# Patient Record
Sex: Female | Born: 1935 | Race: White | Hispanic: No | Smoking: Former smoker
Health system: Southern US, Community
[De-identification: ages and names within clinical notes are randomized; demographics above are authoritative.]

## PROBLEM LIST (undated history)

## (undated) DIAGNOSIS — C55 Malignant neoplasm of uterus, part unspecified: Secondary | ICD-10-CM

## (undated) DIAGNOSIS — I519 Heart disease, unspecified: Secondary | ICD-10-CM

## (undated) DIAGNOSIS — R740 Nonspecific elevation of levels of transaminase and lactic acid dehydrogenase [LDH]: Secondary | ICD-10-CM

## (undated) DIAGNOSIS — N211 Calculus in urethra: Secondary | ICD-10-CM

## (undated) DIAGNOSIS — R7303 Prediabetes: Secondary | ICD-10-CM

## (undated) DIAGNOSIS — I10 Essential (primary) hypertension: Secondary | ICD-10-CM

## (undated) DIAGNOSIS — N312 Flaccid neuropathic bladder, not elsewhere classified: Secondary | ICD-10-CM

## (undated) DIAGNOSIS — M1990 Unspecified osteoarthritis, unspecified site: Secondary | ICD-10-CM

## (undated) DIAGNOSIS — K219 Gastro-esophageal reflux disease without esophagitis: Secondary | ICD-10-CM

## (undated) HISTORY — DX: Heart disease, unspecified: I51.9

## (undated) HISTORY — DX: Flaccid neuropathic bladder, not elsewhere classified: N31.2

## (undated) HISTORY — DX: Prediabetes: R73.03

## (undated) HISTORY — DX: Calculus in urethra: N21.1

## (undated) HISTORY — PX: TOTAL KNEE ARTHROPLASTY: SHX125

## (undated) HISTORY — PX: APPENDECTOMY: SHX54

## (undated) HISTORY — DX: Essential (primary) hypertension: I10

## (undated) HISTORY — DX: Unspecified osteoarthritis, unspecified site: M19.90

## (undated) HISTORY — DX: Gastro-esophageal reflux disease without esophagitis: K21.9

## (undated) HISTORY — DX: Nonspecific elevation of levels of transaminase and lactic acid dehydrogenase (ldh): R74.0

## (undated) HISTORY — DX: Malignant neoplasm of uterus, part unspecified: C55

---

## 1988-05-22 HISTORY — PX: TOTAL ABDOMINAL HYSTERECTOMY W/ BILATERAL SALPINGOOPHORECTOMY: SHX83

## 1989-05-22 HISTORY — PX: OTHER SURGICAL HISTORY: SHX169

## 1994-05-22 DIAGNOSIS — R7401 Elevation of levels of liver transaminase levels: Secondary | ICD-10-CM

## 1994-05-22 HISTORY — DX: Elevation of levels of liver transaminase levels: R74.01

## 1996-05-22 HISTORY — PX: COLONOSCOPY W/ POLYPECTOMY: SHX1380

## 1998-01-27 ENCOUNTER — Other Ambulatory Visit: Admission: RE | Admit: 1998-01-27 | Discharge: 1998-01-27 | Payer: Self-pay | Admitting: Obstetrics and Gynecology

## 1999-03-23 ENCOUNTER — Other Ambulatory Visit: Admission: RE | Admit: 1999-03-23 | Discharge: 1999-03-23 | Payer: Self-pay | Admitting: Obstetrics and Gynecology

## 2000-04-24 ENCOUNTER — Encounter: Admission: RE | Admit: 2000-04-24 | Discharge: 2000-04-24 | Payer: Self-pay | Admitting: Internal Medicine

## 2000-04-24 ENCOUNTER — Encounter: Payer: Self-pay | Admitting: Internal Medicine

## 2000-05-08 ENCOUNTER — Other Ambulatory Visit: Admission: RE | Admit: 2000-05-08 | Discharge: 2000-05-08 | Payer: Self-pay | Admitting: Obstetrics and Gynecology

## 2000-11-26 ENCOUNTER — Inpatient Hospital Stay (HOSPITAL_COMMUNITY): Admission: RE | Admit: 2000-11-26 | Discharge: 2000-11-28 | Payer: Self-pay | Admitting: Orthopaedic Surgery

## 2000-11-28 ENCOUNTER — Inpatient Hospital Stay (HOSPITAL_COMMUNITY)
Admission: RE | Admit: 2000-11-28 | Discharge: 2000-12-06 | Payer: Self-pay | Admitting: Physical Medicine & Rehabilitation

## 2001-06-26 ENCOUNTER — Encounter: Payer: Self-pay | Admitting: Obstetrics and Gynecology

## 2001-06-26 ENCOUNTER — Encounter: Admission: RE | Admit: 2001-06-26 | Discharge: 2001-06-26 | Payer: Self-pay | Admitting: Obstetrics and Gynecology

## 2002-07-24 ENCOUNTER — Encounter: Payer: Self-pay | Admitting: Internal Medicine

## 2002-07-24 ENCOUNTER — Encounter: Admission: RE | Admit: 2002-07-24 | Discharge: 2002-07-24 | Payer: Self-pay | Admitting: Internal Medicine

## 2003-07-30 ENCOUNTER — Encounter: Admission: RE | Admit: 2003-07-30 | Discharge: 2003-07-30 | Payer: Self-pay | Admitting: Internal Medicine

## 2004-05-22 HISTORY — PX: ESOPHAGEAL DILATION: SHX303

## 2004-06-16 ENCOUNTER — Other Ambulatory Visit: Admission: RE | Admit: 2004-06-16 | Discharge: 2004-06-16 | Payer: Self-pay | Admitting: Obstetrics and Gynecology

## 2004-08-01 ENCOUNTER — Ambulatory Visit: Payer: Self-pay | Admitting: Gastroenterology

## 2004-08-11 ENCOUNTER — Encounter: Admission: RE | Admit: 2004-08-11 | Discharge: 2004-08-11 | Payer: Self-pay | Admitting: Internal Medicine

## 2004-08-17 ENCOUNTER — Ambulatory Visit: Payer: Self-pay | Admitting: Gastroenterology

## 2004-08-18 ENCOUNTER — Ambulatory Visit: Payer: Self-pay | Admitting: Internal Medicine

## 2004-11-16 ENCOUNTER — Ambulatory Visit: Payer: Self-pay | Admitting: Internal Medicine

## 2004-12-22 LAB — HM COLONOSCOPY

## 2004-12-27 ENCOUNTER — Ambulatory Visit: Payer: Self-pay | Admitting: Gastroenterology

## 2005-01-18 ENCOUNTER — Ambulatory Visit: Payer: Self-pay | Admitting: Gastroenterology

## 2005-08-17 ENCOUNTER — Other Ambulatory Visit: Admission: RE | Admit: 2005-08-17 | Discharge: 2005-08-17 | Payer: Self-pay | Admitting: Obstetrics and Gynecology

## 2005-08-23 ENCOUNTER — Encounter: Admission: RE | Admit: 2005-08-23 | Discharge: 2005-08-23 | Payer: Self-pay | Admitting: Internal Medicine

## 2005-09-26 ENCOUNTER — Ambulatory Visit: Payer: Self-pay | Admitting: Internal Medicine

## 2006-10-04 ENCOUNTER — Encounter: Payer: Self-pay | Admitting: Internal Medicine

## 2006-10-04 ENCOUNTER — Ambulatory Visit: Payer: Self-pay | Admitting: Internal Medicine

## 2006-10-04 LAB — CONVERTED CEMR LAB
AST: 26 units/L (ref 0–37)
BUN: 27 mg/dL — ABNORMAL HIGH (ref 6–23)
Basophils Absolute: 0.1 10*3/uL (ref 0.0–0.1)
Cholesterol: 199 mg/dL (ref 0–200)
Creatinine, Ser: 1.3 mg/dL — ABNORMAL HIGH (ref 0.4–1.2)
Eosinophils Absolute: 0.2 10*3/uL (ref 0.0–0.6)
Eosinophils Relative: 1.9 % (ref 0.0–5.0)
HCT: 44.9 % (ref 36.0–46.0)
Hemoglobin: 15.1 g/dL — ABNORMAL HIGH (ref 12.0–15.0)
Hgb A1c MFr Bld: 5.8 % (ref 4.6–6.0)
MCHC: 33.5 g/dL (ref 30.0–36.0)
MCV: 90.4 fL (ref 78.0–100.0)
Microalb Creat Ratio: 11.7 mg/g (ref 0.0–30.0)
Monocytes Absolute: 1 10*3/uL — ABNORMAL HIGH (ref 0.2–0.7)
Neutrophils Relative %: 60.5 % (ref 43.0–77.0)
RBC: 4.97 M/uL (ref 3.87–5.11)
WBC: 10.3 10*3/uL (ref 4.5–10.5)

## 2006-10-16 ENCOUNTER — Ambulatory Visit: Payer: Self-pay | Admitting: Internal Medicine

## 2006-10-22 ENCOUNTER — Ambulatory Visit: Payer: Self-pay | Admitting: Gastroenterology

## 2007-03-27 ENCOUNTER — Telehealth (INDEPENDENT_AMBULATORY_CARE_PROVIDER_SITE_OTHER): Payer: Self-pay | Admitting: *Deleted

## 2007-05-23 HISTORY — PX: POSTERIOR LAMINECTOMY / DECOMPRESSION LUMBAR SPINE: SUR740

## 2007-06-19 ENCOUNTER — Telehealth (INDEPENDENT_AMBULATORY_CARE_PROVIDER_SITE_OTHER): Payer: Self-pay | Admitting: *Deleted

## 2007-06-27 ENCOUNTER — Telehealth (INDEPENDENT_AMBULATORY_CARE_PROVIDER_SITE_OTHER): Payer: Self-pay | Admitting: *Deleted

## 2007-07-31 ENCOUNTER — Encounter: Payer: Self-pay | Admitting: Internal Medicine

## 2007-08-02 ENCOUNTER — Telehealth (INDEPENDENT_AMBULATORY_CARE_PROVIDER_SITE_OTHER): Payer: Self-pay | Admitting: *Deleted

## 2007-08-09 ENCOUNTER — Telehealth (INDEPENDENT_AMBULATORY_CARE_PROVIDER_SITE_OTHER): Payer: Self-pay | Admitting: *Deleted

## 2007-09-10 ENCOUNTER — Telehealth (INDEPENDENT_AMBULATORY_CARE_PROVIDER_SITE_OTHER): Payer: Self-pay | Admitting: *Deleted

## 2007-11-01 DIAGNOSIS — C55 Malignant neoplasm of uterus, part unspecified: Secondary | ICD-10-CM

## 2007-12-30 ENCOUNTER — Ambulatory Visit: Payer: Self-pay | Admitting: Internal Medicine

## 2007-12-30 ENCOUNTER — Telehealth: Payer: Self-pay | Admitting: Internal Medicine

## 2007-12-30 DIAGNOSIS — R0609 Other forms of dyspnea: Secondary | ICD-10-CM

## 2007-12-30 DIAGNOSIS — R0989 Other specified symptoms and signs involving the circulatory and respiratory systems: Secondary | ICD-10-CM | POA: Insufficient documentation

## 2007-12-30 DIAGNOSIS — I10 Essential (primary) hypertension: Secondary | ICD-10-CM

## 2007-12-30 HISTORY — DX: Essential (primary) hypertension: I10

## 2008-01-01 ENCOUNTER — Encounter (INDEPENDENT_AMBULATORY_CARE_PROVIDER_SITE_OTHER): Payer: Self-pay | Admitting: *Deleted

## 2008-01-03 ENCOUNTER — Encounter: Payer: Self-pay | Admitting: Internal Medicine

## 2008-01-05 LAB — CONVERTED CEMR LAB
Albumin: 3.6 g/dL (ref 3.5–5.2)
Alkaline Phosphatase: 82 units/L (ref 39–117)
BUN: 31 mg/dL — ABNORMAL HIGH (ref 6–23)
Basophils Relative: 0.9 % (ref 0.0–3.0)
Calcium: 9.4 mg/dL (ref 8.4–10.5)
Creatinine, Ser: 1.3 mg/dL — ABNORMAL HIGH (ref 0.4–1.2)
Eosinophils Absolute: 0.2 10*3/uL (ref 0.0–0.7)
Eosinophils Relative: 1.7 % (ref 0.0–5.0)
GFR calc Af Amer: 51 mL/min
GFR calc non Af Amer: 42 mL/min
Glucose, Bld: 108 mg/dL — ABNORMAL HIGH (ref 70–99)
HCT: 42.8 % (ref 36.0–46.0)
Hemoglobin: 14.6 g/dL (ref 12.0–15.0)
MCV: 92.5 fL (ref 78.0–100.0)
Monocytes Absolute: 0.9 10*3/uL (ref 0.1–1.0)
Monocytes Relative: 7.6 % (ref 3.0–12.0)
Neutro Abs: 7.8 10*3/uL — ABNORMAL HIGH (ref 1.4–7.7)
Platelets: 288 10*3/uL (ref 150–400)
Total CHOL/HDL Ratio: 3.7
Total Protein: 6.8 g/dL (ref 6.0–8.3)
Triglycerides: 78 mg/dL (ref 0–149)
WBC: 11.4 10*3/uL — ABNORMAL HIGH (ref 4.5–10.5)

## 2008-01-06 ENCOUNTER — Encounter (INDEPENDENT_AMBULATORY_CARE_PROVIDER_SITE_OTHER): Payer: Self-pay | Admitting: *Deleted

## 2008-01-14 ENCOUNTER — Inpatient Hospital Stay (HOSPITAL_COMMUNITY): Admission: RE | Admit: 2008-01-14 | Discharge: 2008-01-18 | Payer: Self-pay | Admitting: Orthopaedic Surgery

## 2008-01-23 ENCOUNTER — Encounter: Payer: Self-pay | Admitting: Internal Medicine

## 2008-02-07 ENCOUNTER — Encounter: Payer: Self-pay | Admitting: Internal Medicine

## 2008-03-11 ENCOUNTER — Encounter: Payer: Self-pay | Admitting: Internal Medicine

## 2008-03-31 ENCOUNTER — Encounter: Payer: Self-pay | Admitting: Internal Medicine

## 2008-06-10 ENCOUNTER — Encounter: Payer: Self-pay | Admitting: Internal Medicine

## 2008-07-24 ENCOUNTER — Encounter: Payer: Self-pay | Admitting: Internal Medicine

## 2008-09-29 ENCOUNTER — Encounter: Payer: Self-pay | Admitting: Internal Medicine

## 2009-01-01 ENCOUNTER — Ambulatory Visit: Payer: Self-pay | Admitting: Internal Medicine

## 2009-01-01 DIAGNOSIS — R609 Edema, unspecified: Secondary | ICD-10-CM

## 2009-01-01 DIAGNOSIS — N312 Flaccid neuropathic bladder, not elsewhere classified: Secondary | ICD-10-CM

## 2009-01-02 LAB — CONVERTED CEMR LAB
ALT: 22 units/L (ref 0–35)
BUN: 19 mg/dL (ref 6–23)
Chloride: 102 meq/L (ref 96–112)
Glucose, Bld: 101 mg/dL — ABNORMAL HIGH (ref 70–99)
Potassium: 3.8 meq/L (ref 3.5–5.3)

## 2009-01-05 ENCOUNTER — Encounter (INDEPENDENT_AMBULATORY_CARE_PROVIDER_SITE_OTHER): Payer: Self-pay | Admitting: *Deleted

## 2009-01-07 ENCOUNTER — Ambulatory Visit: Payer: Self-pay

## 2009-01-07 ENCOUNTER — Encounter: Payer: Self-pay | Admitting: Internal Medicine

## 2009-01-12 ENCOUNTER — Encounter (INDEPENDENT_AMBULATORY_CARE_PROVIDER_SITE_OTHER): Payer: Self-pay | Admitting: *Deleted

## 2009-01-20 ENCOUNTER — Encounter: Payer: Self-pay | Admitting: Internal Medicine

## 2009-02-03 ENCOUNTER — Ambulatory Visit (HOSPITAL_COMMUNITY): Admission: RE | Admit: 2009-02-03 | Discharge: 2009-02-03 | Payer: Self-pay | Admitting: Urology

## 2009-05-03 ENCOUNTER — Encounter: Payer: Self-pay | Admitting: Internal Medicine

## 2009-06-25 ENCOUNTER — Encounter (INDEPENDENT_AMBULATORY_CARE_PROVIDER_SITE_OTHER): Payer: Self-pay | Admitting: *Deleted

## 2009-09-06 ENCOUNTER — Encounter: Payer: Self-pay | Admitting: Internal Medicine

## 2010-01-28 ENCOUNTER — Ambulatory Visit: Payer: Self-pay | Admitting: Internal Medicine

## 2010-02-21 ENCOUNTER — Encounter: Payer: Self-pay | Admitting: Internal Medicine

## 2010-02-24 ENCOUNTER — Telehealth: Payer: Self-pay | Admitting: Gastroenterology

## 2010-03-18 ENCOUNTER — Ambulatory Visit: Payer: Self-pay | Admitting: Gastroenterology

## 2010-03-24 ENCOUNTER — Encounter: Payer: Self-pay | Admitting: Gastroenterology

## 2010-04-01 ENCOUNTER — Encounter: Payer: Self-pay | Admitting: Internal Medicine

## 2010-06-19 LAB — CONVERTED CEMR LAB
Albumin: 3.5 g/dL (ref 3.5–5.2)
Alkaline Phosphatase: 86 units/L (ref 39–117)
Basophils Relative: 0.6 % (ref 0.0–3.0)
Calcium: 9.8 mg/dL (ref 8.4–10.5)
Creatinine, Ser: 1.2 mg/dL (ref 0.4–1.2)
Eosinophils Relative: 2.5 % (ref 0.0–5.0)
GFR calc non Af Amer: 45.55 mL/min (ref >60)
Glucose, Bld: 106 mg/dL — ABNORMAL HIGH (ref 70–99)
HDL: 56.5 mg/dL (ref >39.00)
Hemoglobin: 14.3 g/dL (ref 12.0–15.0)
Lymphocytes Relative: 29.9 % (ref 12.0–46.0)
MCHC: 33.2 g/dL (ref 30.0–36.0)
Monocytes Relative: 9.2 % (ref 3.0–12.0)
Neutro Abs: 5.7 10*3/uL (ref 1.4–7.7)
Neutrophils Relative %: 57.8 % (ref 43.0–77.0)
RBC: 4.66 M/uL (ref 3.87–5.11)
Sodium: 141 meq/L (ref 135–145)
Total CHOL/HDL Ratio: 3
Triglycerides: 79 mg/dL (ref 0.0–149.0)
VLDL: 15.8 mg/dL (ref 0.0–40.0)
WBC: 9.9 10*3/uL (ref 4.5–10.5)

## 2010-06-23 NOTE — Letter (Signed)
Summary: [facility]  [facility]   Imported By: Lanelle Bal 09/13/2009 13:09:12  _____________________________________________________________________  External Attachment:    Type:   Image     Comment:   External Document

## 2010-06-23 NOTE — Assessment & Plan Note (Signed)
Summary: discuss change in bowels...as.    History of Present Illness Visit Type: new patient  Primary GI MD: Sheryn Bison MD FACP FAGA Primary Provider: Marga Melnick, MD  Requesting Provider: na Chief Complaint: constipation, and rectal pain and pressure History of Present Illness:   77 year old Caucasian female who has chronic tenesmus and constipation related to spinal surgery with sacral nerve damage 2 years ago. She is on laxatives, fiber supplements, and p.r.n. MiraLax and continue to complain of spasm and a full sensation in her rectum. She at times does have to use enemas. Her problems are probably exacerbated by her use of tramadol for back pain. She also take daily omeprazole for acid reflux. Colonoscopy was unremarkable several years ago, and she denies upper GI or hepatobiliary complaints.  She has an atonic bladder and has a chronic suprapubic bladder catheter in place for the last several years for her neurogenic bladder. She also is status post hysterectomy and removal of her ovaries for uterine cancer.   GI Review of Systems      Denies abdominal pain, acid reflux, belching, bloating, chest pain, dysphagia with liquids, dysphagia with solids, heartburn, loss of appetite, nausea, vomiting, vomiting blood, weight loss, and  weight gain.      Reports change in bowel habits, constipation, and  rectal pain.     Denies anal fissure, black tarry stools, diarrhea, diverticulosis, fecal incontinence, heme positive stool, hemorrhoids, irritable bowel syndrome, jaundice, light color stool, liver problems, and  rectal bleeding.    Current Medications (verified): 1)  Omeprazole 20 Mg  Tbec (Omeprazole) .... Take One Tablet Daily 2)  Benazepril Hcl 40 Mg  Tabs (Benazepril Hcl) .... Take One Tablet Daily 3)  Multivitamins   Tabs (Multiple Vitamin) .Marland Kitchen.. 1 By Mouth Once Daily 4)  Vitamin E 400 Unit  Caps (Vitamin E) .Marland Kitchen.. 1 By Mouth Once Daily 5)  Caltrate 600+d Plus 600-400 Mg-Unit   Tabs (Calcium Carbonate-Vit D-Min) .Marland Kitchen.. 1 By Mouth Two Times A Day 6)  Tramadol Hcl 50 Mg Tabs (Tramadol Hcl) .Marland Kitchen.. 1-2 By Mouth As Needed 7)  Fish Oil 1200 Mg Caps (Omega-3 Fatty Acids) .Marland Kitchen.. 1 By Mouth Once Daily 8)  Tylenol 325 Mg Tabs (Acetaminophen) .... As Needed 9)  Docusate Sodium 100 Mg Caps (Docusate Sodium) .Marland Kitchen.. 1 By Mouth -Am, 1 By Mouth At Bedtime As Needed 10)  Citrucel  Powd (Methylcellulose (Laxative)) .... Every Morning, More If Needed 11)  Miralax  Powd (Polyethylene Glycol 3350) .... As Needed 12)  Furosemide 40 Mg Tabs (Furosemide) .Marland Kitchen.. 1 Once Daily  Allergies (verified): No Known Drug Allergies  Past History:  Past medical, surgical, family and social histories (including risk factors) reviewed for relevance to current acute and chronic problems.  Past Medical History: SBO post TAH/BSO  for uterine cancer 1991;positive hepatitis serology ( + anti  B core antibody) 1996 ; R carotid bruit 2003, normal Doppler Neurogenic Bladder (596.4), Dr  Marcine Matar PREVENTIVE HEALTH CARE (ICD-V70.0) ATONIC BLADDER (ICD-596.4) EDEMA (ICD-782.3) DYSPNEA/SHORTNESS OF BREATH (ICD-786.09) LUMBAR RADICULOPATHY, LEFT (ICD-724.4) METABOLIC SYNDROME X (ICD-277.7) HYPERTENSION (ICD-401.9) GERD (ICD-530.81) COLONIC POLYPS, HX OF (ICD-V12.72) DIVERTICULOSIS, COLON (ICD-562.10) HIATAL HERNIA (ICD-553.3) IRRITABLE BOWEL SYNDROME, HX OF (ICD-V12.79) UTERINE CANCER, HX OF (ICD-V10.42) DEGENERATIVE JOINT DISEASE (ICD-715.90) Family Hx of NEOPLASM, COLON, FAMILY HX (ICD-V16.0) FAMILY HISTORY DIABETES 1ST DEGREE RELATIVE (ICD-V18.0) CAROTID BRUIT, RIGHT (ICD-785.9)  Past Surgical History: colonoscopy revealed tics;endoscopy revealed occult stricture 2006 Appendectomy Bladder Repair, 1992 BSO with TAH, 1990 for uterine  cancer, Dr Gaye Alken  Knee Replacement, 1996(R) & 2002(L) Lysis of adhesions SBO 1991 Colon polypectomy X1 ,PMH of  Lumbar laminectomy/ decompression. L4-5,S-1  12/9007, Dr Annell Greening Suprapubic cath 2010, Dr Dahlstedt Shoulder Surgery   Family History: Reviewed history from 01/28/2010 and no changes required. Father: CVA Mother: colon cancer, DM Siblings: bro: DM,CAD,CE   Social History: Reviewed history from 12/30/2007 and no changes required. Retired Industrial/product designer  Patient is a former smoker.  Alcohol Use - no Illicit Drug Use - no Drug Use:  no  Review of Systems       The patient complains of back pain, shortness of breath, swelling of feet/legs, and urine leakage.  The patient denies allergy/sinus, anemia, anxiety-new, arthritis/joint pain, blood in urine, breast changes/lumps, change in vision, confusion, cough, coughing up blood, depression-new, fainting, fatigue, fever, headaches-new, hearing problems, heart murmur, heart rhythm changes, itching, menstrual pain, muscle pains/cramps, night sweats, nosebleeds, pregnancy symptoms, skin rash, sleeping problems, sore throat, swollen lymph glands, thirst - excessive , urination - excessive , urination changes/pain, vision changes, and voice change.    Vital Signs:  Patient profile:   77 year old female Height:      62 inches Weight:      235 pounds BMI:     43.14 BSA:     2.05 Pulse rate:   60 / minute Pulse rhythm:   regular BP sitting:   120 / 64  (left arm) Cuff size:   large  Vitals Entered By: Ok Anis CMA (March 18, 2010 9:57 AM)  Physical Exam  General:  Well developed, well nourished, no acute distress.healthy appearing.  healthy appearing.   Head:  Normocephalic and atraumatic. Eyes:  PERRLA, no icterus. Lungs:  Clear throughout to auscultation. Heart:  Regular rate and rhythm; no murmurs, rubs,  or bruits. Rectal:  inspection of the rectum is unremarkable as is rectal exam. There is firm hard stool in the rectal vault is guaiac negative. There is no evidence of impaction, masses or tenderness. Rectal squeeze pressure appears intact. Extremities:  No  clubbing, cyanosis, edema or deformities noted.trace pedal edema.  trace pedal edema.   Neurologic:  Alert and  oriented x4;  grossly normal neurologically. Psych:  Alert and cooperative. Normal mood and affect.anxious.  anxious.     Impression & Recommendations:  Problem # 1:  CONSTIPATION (ICD-564.00) Assessment Deteriorated continue fiber supplements, MiraLax, and trial of Amitiza 8 micrograms twice a day with meals. I also have prescribed local and RAM cream twice a day to her rectum to hopefully alleviate some of her rectal spasm symptomatology. We also have referred her to see Dr. Dorita Fray at the [facility] neurogenic GI clinic for anorectal manometry and perhaps biofeedback training. I do not think repeat colonoscopy is indicated at this time. Some of her problems may be related to use her tramadol.  Problem # 2:  ATONIC BLADDER (ICD-596.4) Assessment: Unchanged  Problem # 3:  LUMBAR RADICULOPATHY, LEFT (ICD-724.4) Assessment: Comment Only  Problem # 4:  GERD (ICD-530.81) Assessment: Improved continue reflex maneuvers and omeprazole 20 mg a day.  Other Orders: [facility])  Patient Instructions: 1)  Copy sent to : Marga Melnick, MD  2)  Your prescription(s) have been sent to you pharmacy.  3)  Please continue current medications.  4)  We are faxing all of your records to [facility] and will contact you about  an appt when they schedule you. 5)  The medication list was reviewed and reconciled.  All  changed / newly prescribed medications were explained.  A complete medication list was provided to the patient / caregiver. Prescriptions: ANALPRAM-HC 1-2.5 % CREA (HYDROCORTISONE ACE-PRAMOXINE) use per rectum two times a day  #1 tube x 1   Entered by:   Harlow Mares CMA (AAMA)   Authorized by:   Mardella Layman MD [facility]   Signed by:   Harlow Mares CMA (AAMA) on 03/18/2010   Method used:   Electronically to        [facility]  [address] [id number]        [address]       [place], [place]  [address]       Ph: [phone] or [phone]       Fax: [phone]   RxID:   [id number] AMITIZA 8 MCG CAPS (LUBIPROSTONE) take one by mouth two times a day  #60 x 3   Entered by:   Harlow Mares CMA (AAMA)   Authorized by:   Mardella Layman MD [facility]   Signed by:   Harlow Mares CMA (AAMA) on 03/18/2010   Method used:   [facility]  [address] [id number]       [address]       [place], [place]  [address]       Ph: [phone] or [phone]       Fax: [phone]   RxID:   [id number]

## 2010-06-23 NOTE — Medication Information (Signed)
Summary: Approval/[facility]  Approval/[facility]   Imported By: Lester Wineglass 04/01/2010 08:41:19  _____________________________________________________________________  External Attachment:    Type:   Image     Comment:   External Document

## 2010-06-23 NOTE — Letter (Signed)
Summary: [facility]  [facility]   Imported By: Lanelle Bal 04/13/2010 13:45:53  _____________________________________________________________________  External Attachment:    Type:   Image     Comment:   External Document

## 2010-06-23 NOTE — Progress Notes (Signed)
Summary: Schedule Office Visit to Discuss Colonoscopy   Phone Note Outgoing Call Call back at [facility] Phone [phone]   Call placed by: Harlow Mares CMA Duncan Dull),  February 24, 2010 3:02 PM Call placed to: Patient Summary of Call: patient is aware she is due for an office visit, She will have her daughter to contact our office back and schedule an appt. I made sure she had the number.  Initial call taken by: Harlow Mares CMA Duncan Dull),  February 24, 2010 3:04 PM     Appended Document: Schedule Office Visit to Discuss Colonoscopy do stool cards..TO: Facility Name: To the Attention of: Facsimile# Telephone#  List of Information Sent:/u not needed at her age unless ++  Appended Document: Schedule Office Visit to Discuss Colonoscopy called pt to advise her she just needs to do stool cards but she explains that she has had change in her bowels.

## 2010-06-23 NOTE — Letter (Signed)
Summary: Colonoscopy-Changed to Office Visit Letter  [facility]  [address] [place], [place] [address]   Phone: [phone]  Fax: [phone]      June 25, 2009 MRN: [id number]   [facility] [address] [place], [place]  [address]   Dear Ms. Hirt,   According to our records, it is time for you to schedule a Colonoscopy. However, after reviewing your medical record, I feel that an office visit would be most appropriate to more completely evaluate you and determine your need for a repeat procedure.  Please call [phone] (option #2) at your convenience to schedule an office visit. If you have any questions, concerns, or feel that this letter is in error, we would appreciate your call.   Sincerely,  Vania Rea. Jarold Motto, M.D  [facility] [phone]

## 2010-06-23 NOTE — Letter (Signed)
Summary: Cancer Registry [facility]  Cancer Registry Letter/[facility]   Imported By: Lanelle Bal 02/25/2010 12:31:46  _____________________________________________________________________  External Attachment:    Type:   Image     Comment:   External Document

## 2010-06-23 NOTE — Assessment & Plan Note (Signed)
Summary: legs swollen/med refill/cbs   Vital Signs:  Patient profile:   77 year old female Height:      62 inches Weight:      232.8 pounds BMI:     42.73 Temp:     98.4 degrees F oral Pulse rate:   64 / minute Resp:     16 per minute BP sitting:   110 / 68  (left arm) Cuff size:   large  Vitals Entered By: Shonna Chock CMA (January 28, 2010 11:38 AM) CC: Yearly follow-up and leg concerns   CC:  Yearly follow-up and leg concerns.  History of Present Illness: Here for Medicare AWV: 1.Risk factors based on Past M, S, F history:Edema, ERD;HTN(chart updated) 2.Physical Activities: none (sedentary) due to LS neuropathy, worse post lumbar decompression  3.Depression/mood: denied 4.Hearing:decreased to whisper @ 6 ft  5.ADL's:daughter ,Idalia Needle helps with housework 6.Fall Risk: using walker 7.Home Safety: denied 8.Height, weight, &visual acuity:wall chart read @ 6 ft woth lenses 9.Counseling: none requested; Living Will /POA in place 10.Labs ordered based on risk factors: see Orders 11. Referral Coordination: Audiology 12. Care Plan: see Instructions 13. Cognitive Assessment: Oriented X 3 ; mood & affect normal; memory & recall intact   ; "WORLD" spelled backwards very rapidly. Hypertension Follow-Up      This is an 77 year old woman who presents for Hypertension follow-up.  The patient denies lightheadedness, headaches, and fatigue.  Associated symptoms include exercise intolerance and dyspnea.  The patient denies the following associated symptoms: chest pain, chest pressure, palpitations, syncope, leg edema  and pedal edema, mainly of LLE.  Compliance with medications (by patient report) has been near 100%.  Adjunctive measures currently used by the patient include salt restriction. BP not checked @ home.   Preventive Screening-Counseling & Management  Alcohol-Tobacco     Alcohol drinks/day: 0     Smoking Status: quit     Year Quit: 1988  Caffeine-Diet-Exercise     Caffeine  use/day: none     Diet Comments: no diet  Hep-HIV-STD-Contraception     Dental Visit-last 6 months no     Dental Care Counseling: @ least annually     Sun Exposure-Excessive: no  Safety-Violence-Falls     Seat Belt Use: yes     Firearms in the Home: no firearms in the home     Smoke Detectors: yes      Blood Transfusions:  no.        Travel History:  no recent travel.    Current Medications (verified): 1)  Omeprazole 20 Mg  Tbec (Omeprazole) .... Take One Tablet Daily 2)  Benazepril Hcl 40 Mg  Tabs (Benazepril Hcl) .... Take One Tablet Daily-Office Visit and Labs Due Now For Additional Refills 3)  Multivitamins   Tabs (Multiple Vitamin) .Marland Kitchen.. 1 By Mouth Once Daily 4)  Vitamin E 400 Unit  Caps (Vitamin E) .Marland Kitchen.. 1 By Mouth Once Daily 5)  Caltrate 600+d Plus 600-400 Mg-Unit  Tabs (Calcium Carbonate-Vit D-Min) .Marland Kitchen.. 1 By Mouth Two Times A Day 6)  Tramadol Hcl 50 Mg Tabs (Tramadol Hcl) .Marland Kitchen.. 1-2 By Mouth As Needed 7)  Fish Oil 1200 Mg Caps (Omega-3 Fatty Acids) .Marland Kitchen.. 1 By Mouth Once Daily 8)  Tylenol 325 Mg Tabs (Acetaminophen) .... As Needed 9)  Docusate Sodium 100 Mg Caps (Docusate Sodium) .Marland Kitchen.. 1 By Mouth -Am, 1 By Mouth At Bedtime As Needed 10)  Citrucel  Powd (Methylcellulose (Laxative)) .... Every Morning, More If Needed 11)  Miralax  Powd (Polyethylene Glycol 3350) .... As Needed 12)  Furosemide 40 Mg Tabs (Furosemide) .Marland Kitchen.. 1 Once Daily **appointment Due**  Allergies (verified): No Known Drug Allergies  Past History:  Past Medical History: SBO post TAH/BSO  for uterine cancer 1991;positive hepatitis serology ( + anti  B core antibody) 1996 ; R carotid bruit 2003, normal Doppler Colonic polyps, PMH  of GERD Hypertension Neurogenic Bladder (596.4), Dr  Marcine Matar  Past Surgical History: colonoscopy revealed tics;endoscopy revealed occult stricture 2006 Appendectomy Bladder Repair, 1992 BSO with TAH, 1990 for uterine  cancer, Dr Gaye Alken Knee Replacement, 1996(R) &  2002(L) Lysis of adhesions SBO 1991 Colon polypectomy X1 ,PMH of  Lumbar laminectomy/ decompression. L4-5,S-1 12/9007, Dr Annell Greening Suprapubic cath 2010, Dr Retta Diones  Family History: Father: CVA Mother: colon cancer, DM Siblings: bro: DM,CAD,CE   Social History: Caffeine use/day:  none Dental Care w/in 6 mos.:  no Sun Exposure-Excessive:  no Seat Belt Use:  yes Blood Transfusions:  no  Review of Systems  The patient denies anorexia, fever, weight loss, weight gain, prolonged cough, hemoptysis, abdominal pain, melena, hematochezia, severe indigestion/heartburn, suspicious skin lesions, unusual weight change, abnormal bleeding, enlarged lymph nodes, and angioedema.    Physical Exam  General:  Appears younger than age,well-nourished,in no acute distress; alert,appropriate and cooperative throughout examination Head:  Normocephalic and atraumatic without obvious abnormalities. Eyes:  No corneal or conjunctival inflammation noted.Perrla. Funduscopic exam benign, without hemorrhages, exudates or papilledema.  Ears:  External ear exam shows no significant lesions or deformities.  Otoscopic examination reveals  waxbilaterally Nose:  External nasal examination shows no deformity or inflammation. Nasal mucosa are pink and moist without lesions or exudates. Mouth:  Oral mucosa and oropharynx without lesions or exudates.  Loer implants in good repair; upper plate. Neck:  No deformities, masses, or tenderness noted. Lungs:  Normal respiratory effort, chest expands symmetrically. Lungs are clear to auscultation, no crackles or wheezes. Heart:  Normal rate and regular rhythm. S1 and S2 normal without gallop, murmur, click, rub .S4 Abdomen:  Bowel sounds positive,abdomen soft and non-tender without masses, organomegaly or hernias noted. Genitalia:  Dr Arelia Sneddon, Gyn Msk:  No deformity or scoliosis noted of thoracic or lumbar spine.   Pulses:  R and L carotid,radial,dorsalis pedis and posterior  tibial pulses are full and equal bilaterally Extremities:  No clubbing, cyanosis.trace left pedal edema and trace right pedal edema.  OA hand changes Neurologic:  alert & oriented X3 and DTRs symmetrical and normal.   Skin:  Intact without suspicious lesions or rashes Cervical Nodes:  No lymphadenopathy noted Axillary Nodes:  No palpable lymphadenopathy Psych:  memory intact for recent and remote, normally interactive, good eye contact, not anxious appearing, and not depressed appearing.     Impression & Recommendations:  Problem # 1:  PREVENTIVE HEALTH CARE (ICD-V70.0)  Orders: [facility] -Subsequent Annual Wellness Visit [phone])  Problem # 2:  EDEMA (ICD-782.3)  Her updated medication list for this problem includes:    Furosemide 40 Mg Tabs (Furosemide) .Marland Kitchen... 1 once daily Orders: Venipuncture (13086)  Problem # 3:  HYPERTENSION (ICD-401.9)  Her updated medication list for this problem includes:    Benazepril Hcl 40 Mg Tabs (Benazepril hcl) .Marland Kitchen... Take one tablet daily-office visit and labs due now for additional refills    Furosemide 40 Mg Tabs (Furosemide) .Marland Kitchen... 1 once daily  Orders: Venipuncture (57846) TLB-Lipid Panel (80061-LIPID) TLB-Hepatic/Liver Function Pnl (80076-HEPATIC) Specimen Handling (96295)  Problem # 4:  METABOLIC SYNDROME X (ICD-277.7)  Orders:  Venipuncture ([address]) TLB-Lipid Panel (80061-LIPID) TLB-BMP (Basic Metabolic Panel-BMET) (80048-METABOL) TLB-TSH (Thyroid Stimulating Hormone) (84443-TSH) Specimen Handling ([address]454)  Problem # 5:  GERD (ICD-530.81)  Her updated medication list for this problem includes:    Omeprazole 20 Mg Tbec (Omeprazole) .Marland Kitchen... Take one tablet daily  Orders: TLB-CBC Platelet - w/Differential (85025-CBCD) Specimen Handling ([address])  Complete Medication List: 1)  Omeprazole 20 Mg Tbec (Omeprazole) .... Take one tablet daily 2)  Benazepril Hcl 40 Mg Tabs (Benazepril hcl) .... Take one tablet daily 3)  Multivitamins Tabs (Multiple  vitamin) .Marland Kitchen.. 1 by mouth once daily 4)  Vitamin E 400 Unit Caps (Vitamin e) .Marland Kitchen.. 1 by mouth once daily 5)  Caltrate 600+d Plus 600-400 Mg-unit Tabs (Calcium carbonate-vit d-min) .Marland Kitchen.. 1 by mouth two times a day 6)  Tramadol Hcl 50 Mg Tabs (Tramadol hcl) .Marland Kitchen.. 1-2 by mouth as needed 7)  Fish Oil 1200 Mg Caps (Omega-3 fatty acids) .Marland Kitchen.. 1 by mouth once daily 8)  Tylenol 325 Mg Tabs (Acetaminophen) .... As needed 9)  Docusate Sodium 100 Mg Caps (Docusate sodium) .Marland Kitchen.. 1 by mouth -am, 1 by mouth at bedtime as needed 10)  Citrucel Powd (Methylcellulose (laxative)) .... Every morning, more if needed 11)  Miralax Powd (Polyethylene glycol 3350) .... As needed 12)  Furosemide 40 Mg Tabs (Furosemide) .Marland Kitchen.. 1 once daily  Patient Instructions: 1)  Limit your Sodium (Salt) to less than 4 grams a day (slightly less than 1 teaspoon) to prevent fluid retention, swelling, or worsening or symptoms. 2)  Avoid foods high in acid (tomatoes, citrus juices, spicy foods). Avoid eating within two hours of lying down or before exercising. Do not over eat; try smaller more frequent meals. Elevate head of bed twelve inches when sleeping. Prescriptions: TRAMADOL HCL 50 MG TABS (TRAMADOL HCL) 1-2 by mouth as needed  #60 x 2   Entered and Authorized by:   Marga Melnick MD   Signed by:   Marga Melnick MD on 01/28/2010   Method used:   [facility] ...       [facility]  [address] [id number]       [address]       [place], [place]  [address]       Ph: [phone] or [phone]       Fax: [phone]   RxID:   [id number] FUROSEMIDE 40 MG TABS (FUROSEMIDE) 1 once daily  #90 x 3   Entered and Authorized by:   Marga Melnick MD   Signed by:   Marga Melnick MD on 01/28/2010   Method used:   [facility]ed to ...       [facility]  [address] [id number]       [address]       [place], [place]  [address]       Ph: [phone] or [phone]       Fax: [phone]   RxID:    [id number] BENAZEPRIL HCL 40 MG  TABS (BENAZEPRIL HCL) take one tablet daily  #90 x 3   Entered and Authorized by:   Marga Melnick MD   Signed by:   Marga Melnick MD on 01/28/2010   Method used:   [facility]ed to ...       [facility]  [address] [id number]       [address]       [facility] [place] [facility],  [place]  [address]       Ph: [phone] or [phone]       Fax: [phone]   RxID:   [id number]

## 2010-08-26 LAB — CBC
HCT: 41.4 % (ref 36.0–46.0)
Hemoglobin: 14 g/dL (ref 12.0–15.0)
MCHC: 33.8 g/dL (ref 30.0–36.0)
MCV: 91 fL (ref 78.0–100.0)
RBC: 4.55 MIL/uL (ref 3.87–5.11)
WBC: 9.9 10*3/uL (ref 4.0–10.5)

## 2010-08-26 LAB — COMPREHENSIVE METABOLIC PANEL
AST: 23 U/L (ref 0–37)
CO2: 31 mEq/L (ref 19–32)
Calcium: 9.5 mg/dL (ref 8.4–10.5)
Chloride: 103 mEq/L (ref 96–112)
Creatinine, Ser: 1.2 mg/dL (ref 0.4–1.2)
GFR calc non Af Amer: 43 mL/min — ABNORMAL LOW (ref >60)
Glucose, Bld: 103 mg/dL — ABNORMAL HIGH (ref 70–99)
Total Bilirubin: 0.5 mg/dL (ref 0.3–1.2)

## 2010-10-04 NOTE — Assessment & Plan Note (Signed)
[facility]                         GASTROENTEROLOGY OFFICE NOTE   Christina Martin, Christina Martin                       MRN:          [id number]  DATE:10/22/2006                            DOB:          Oct 02, 1924    Christina Martin is an 77 year old white female whom I did endoscopy and  esophageal dilatation on on January 18, 2005.  She had an occult  stricture and a 3-cm hiatal hernia.  She has had no real reflux symptoms  of note and has not had dysphagia.  She recently saw Dr. Alwyn Ren and  because of her NSAID use was placed on daily Prilosec.  She also has a  family history of colon cancer in her mother and has colonoscopy every 5  years.  She is up to date on her colonoscopy with the last colonoscopy  exam being done two years ago.  She apparently did recent hemoccult  cards that were all negative.  Lab data for Dr. Alwyn Ren showed a normal  CBC.  She denies melena or hematochezia, abdominal pain, anorexia, or  weight loss.   She is on a variety of medications which are listed and reviewed in her  chart.  She does have essential hypertension.   PHYSICAL EXAMINATION:  VITAL SIGNS:  She weighs 237 pounds.  Blood  pressure 124/74.  Pulse 76 and regular.  The patient did not want a general examination done.   RECOMMENDATIONS:  1. Chronic omeprazole therapy daily because of her history of peptic      stricture of her esophagus and her use of NSAIDs with increased      risk for GI bleeding.  2. Family history of colon carcinoma with screening colonoscopy in      five-year intervals.  3. Well controlled essential hypertension.     Vania Rea. Jarold Motto, MD, Caleen Essex, FAGA  Electronically Signed    DRP/MedQ  DD: 10/22/2006  DT: 10/22/2006  Job #: [id number]   cc:   Titus Dubin. Alwyn Ren, MD,FACP,FCCP

## 2010-10-04 NOTE — Op Note (Signed)
NAMENAVEENA, EYMAN                ACCOUNT NO.:  [id number]   MEDICAL RECORD NO.:  [id number]          PATIENT TYPE:  OIB   LOCATION:  5018                         FACILITY:  [facility]   PHYSICIAN:  Mark C. Ophelia Charter, M.D.    DATE OF BIRTH:  20-Nov-1924   DATE OF PROCEDURE:  01/13/2008  DATE OF DISCHARGE:                               OPERATIVE REPORT   POSTOPERATIVE DIAGNOSIS:  Severe lumbar stenosis, L4-L5 and L5-S1.   PROCEDURE:  L4-L5 and L5-S1 decompression, microscope assisted.   SURGEON:  Mark C. Ophelia Charter, MD   ANESTHESIA:  General plus Marcaine skin local.   ESTIMATED BLOOD LOSS:  200 mL.   COMPLICATIONS:  Dural tear, repaired with 4-0 Nurolon and Tisseel.   ASSISTANT:  Wende Neighbors, PA-C    An 77 year old female who has had progressive lumbar stenosis with  neurogenic claudication and pain.  MRI scan showed stenosis at L4-L5 and  L5-S1, most significant levels with the significant compression.   PROCEDURE:  After induction of general anesthesia, the patient was  placed in prone position.  She had 2 g of Ancef, care was taken with the  right shoulder, which had previous rotator cuff repair.  Back was  prepped with DuraPrep, it was noted that, although she is on chest  rolls, she could barely stabilize to have extra securement due to her  large size around the hips.  With slight pressure, she had 10-1 fall off  or shift for one side to the other.  After prepping the area squared  with towels, Betadine bio drape applied.  Multiple x-rays had to be  taken with the spinal needle x2 place, which had to be reviewed by the  radiologist.  1. Radiologist looked at it as well as myself for confirmation of      appropriate level due to the large size of the patient through the      hip region.  Incision was made and the extra deep retractors      finally using the Viper retractor for exposure.  Lamina were      removed and there was a very, very thick lamina which had to be  removed with a Berman with motion it was difficult to appreciate      motion between L5-S1 and there was overlap the lamina and the      interlaminar space was less than half a millimeter and minimally      mobile.  With using the bur and thinning down the bone.  A small      dural tear occurred as the sacrum was being stand.  Small patty was      placed and later on some Tisseel was applied to this area was      slightly to the right and about 5-6 mm distal to the top of S1      lamina dorsally.  There was extreme tightness and a and a      extradural cyst was noted slightly more to the right and left side.      This that was at 05/01  level.  There was disk herniation more      prominent on the right and left with the old extruded disk material      just over the shoulder of the nerve root of L5.  Chunks of disk      were removed.  There was bone spurs over hanging the midline and      adherent  to the dura and microdissection techniques with the      operative microscope and small titanium dissection instruments were      being  used when the dural tear occurred.  There were some nerve      roots that had be tucked back in the dura  iand Nurolon three      simple sutures were placed followed by Tisseel.  Checking the 35 cm      water pressure.  The repair was watertight.  Continued removal of      bone laterally and extremely large chunks of a ligament were      removed.  Some areas the dura was flattened down only about to 2-3      mm and AP diameter once ligaments were removed and the from a      epidural cyst was removed which was this had the 05/01 disk level      slightly above this was dissected off the dura.  The dura was once      again a nice round shape.  Foramen was carefully palpated      overhanging spurs removed.  After irrigation saline solution.      Repair was checked one last time of water pressure 30 no evidence      of leakage.  Some FloSeal was placed over the sides  of the nerve      root where epidural veins had been bipolarred,, repeat irrigation      with saline solution and then closure.  The fascia was zero Vicryl      interrupted figure-of-eight and simple closure of the adipose layer      which was 6-8 inches in depth with interrupted 2-0 Vicryl and then      skin closure postop dressing.  The patient became down to on the      morning that she get up start ambulating.  Menstrual count needle      count was correct.   SUMMARY:  Duanne Moron C. Ophelia Charter, M.D.  Electronically Signed     MCY/MEDQ  D:  01/13/2008  T:  01/14/2008  Job:  [id number]

## 2010-10-07 NOTE — Discharge Summary (Signed)
[place]. [facility]  Patient:    Christina Martin, Christina Martin Visit Number: [id number] MRN: [id number]          Service Type: [facility] Location: [id number] Attending Physician:  Herold Harms Dictated by:   Veverly Fells Ophelia Charter, M.D. Adm. Date:  11/28/2000 Disc. Date: 12/06/2000                             Discharge Summary  FINAL DIAGNOSIS:  Left knee osteoarthritis.  ADDITIONAL DIAGNOSIS:  Obesity.  PROCEDURE:  Left total knee arthroplasty on November 26, 2000; Osteonics #7 femoral component press-fit series 7000, tibial tray #7, 12-mm #7 tibial bearing, posteriorly stabilized and #7 patellar component cemented on November 26, 2000.  HISTORY OF PRESENT ILLNESS:  This 77 year old female has been followed with chronic left knee osteoarthritis with night pain, recurrent effusion, varus deformity, bone-on-bone changes in the medial compartment with marginal osteophytes, and significant lateral and patellofemoral changes debrided on arthroscopy in the past.  She was admitted for left total knee arthroplasty.  ADMISSION LABORATORY DATA:  Normal sinus rhythm with left atrial enlargement, left ventricular hypertrophy.  Admission hemoglobin was 13.9.  PT/PTT were normal.  Chemistry panel normal. Urinalysis showed nitrite positive, many bacteria.  Small leukocyte esterase and 0-2 WBC per high-power field.  HOSPITAL COURSE:  The patient was admitted after preoperative informed consent was obtained and underwent the above procedure by Dr. Annell Greening with Dr. Odette Fraction assisting.  General anesthesia was used.  Estimated blood loss was 100 cc.  Postop hemoglobin was 11.6.  She had some bloody drainage through her dressing, which was changed postop day #1.  Sciatic nerve function was intact.  INR was 1.3 on November 28, 2000.  Foley was removed and she was converted from PCA morphine to p.o. Percocet.  She was felt to be a good candidate after rehab evaluation and the patient was  transferred to rehab on November 28, 2000 for continued rehab.  She was making good progress with therapy, although slightly slow due to her obesity, and mild decreased upper extremity strength and opposite knee mild degenerative changes.  Postop knee x-ray showed good position of the total knee arthroplasty. Dictated by:   Veverly Fells Ophelia Charter, M.D. Attending Physician:  Herold Harms DD:  01/11/01 TD:  01/13/01 Job: [id number]

## 2010-10-07 NOTE — Op Note (Signed)
[place]. [facility]  Patient:    Christina Martin, PROFIT                       MRN: [id number] Proc. Date: 11/20/00 Adm. Date:  [id number] Attending:  Jacki Cones                           Operative Report  ADMITTING DIAGNOSES:  Left knee osteoarthritis.  HISTORY OF PRESENT ILLNESS:  This 77 year old female has had bilateral knee osteoarthritis.  She has had a right total knee arthroplasty in 1996 and has some mild tibial pain with mild bone reabsorption around the tibial component. She did well for the first five years after her knee replacement.  Left knee has become progressively more painful.  She is having great difficulty ambulating.  Not responding to anti-inflammatories and glucosamine chondroitin and pain medication.  ALLERGIES:  No known drug allergies.  MEDICATIONS: 1. Arthrotec 50 mg two p.o. q.d. 2. Fosamax 70 mg one q.week. 3. Multivitamins one q.d. 4. Calcium plus vitamin D p.o. b.i.d.  PAST SURGICAL HISTORY:  Appendectomy November 1970, hysterectomy October 1990, bladder surgery 1992, right knee scope 1989, right total knee arthroplasty 1996, intestinal adhesions with uterine cancer March 1991.  FAMILY HISTORY:  Positive for coronary artery disease, diabetes, uterine cancer, and hypertension.  REVIEW OF SYSTEMS:  Positive for knee arthritis, uterine cancer 1991, pneumonia, hepatitis as a child.  PHYSICAL EXAMINATION  GENERAL:  Well-developed, mildly obese white female.  VITAL SIGNS:  Temperature 99, pulse 93, respirations 20, blood pressure 152/86.  HEENT:  PERRLA.  EOMI.  TMI.  Pharynx is clear and there is a white nodule on her tonsil.  NECK:  Supple.  No carotid bruits.  LUNGS:  Clear to auscultation.  ABDOMEN:  Soft, nontender.  Liver and spleen are not palpably enlarged.  There are no renal bruits.  EXTREMITIES:  Left knee varus deformity.  Intact pulses.  Sciatic nerve is intact.  LABORATORIES:  X-rays  demonstrate tricompartmental degenerative arthritis with 12 degree varus deformity, bone on bone to the medial compartment, and a large osteophyte tricompartmental.  Subchondral cyst formation and bone erosion with a finding of the femoral condyles worse on the medial than the lateral side.  ASSESSMENT:  Left knee osteoarthritis.  Admission for total knee arthroplasty. 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 DD:  11/26/00 TD:  11/26/00 Job: [id number]

## 2010-10-07 NOTE — Discharge Summary (Signed)
[place]. [facility]  Patient:    Christina Martin, Christina Martin                       MRN: [id number] Adm. Date:  [id number] Disc. Date: [id number] Attending:  Herold Harms Dictator:   Junie Bame, P.A. CC:         Dr. Magnus Ivan F. Alwyn Ren, M.D. [facility]   Discharge Summary  DISCHARGE DIAGNOSES: 1. Left total knee arthroplasty secondary to end-stage osteoarthritis. 2. Osteoarthritis.  HISTORY OF PRESENT ILLNESS:  The patient is a 77 year old white female with past medical history of right total knee arthroplasty in 1996, admitted on November 26, 2000, for left total knee arthroplasty due to end-stage DJD and medical compartment erosion by Dr. Ophelia Charter.  The patient is presently on Coumadin for DVT prophylaxis.  No major complications occurred postoperatively.  The PT report at that time indicated that the patient was ambulating with assist 20 feet with standard walker, can transfer sit to stand with minimum assist, and 50% posture weightbearing on the left.  PAST MEDICAL HISTORY:  Significant for OA, uterine cancer, hepatitis in 1996.  PAST SURGICAL HISTORY:  Significant for appendectomy, hysterectomy, bladder surgery in 1992.  ALLERGIES:  No known drug allergies.  MEDICATIONS PRIOR TO ADMISSION: 1. Arthrotec 50 mg 1-2 tabs p.o. q.d. 2. Fosamax 70 mg 1 tablet q. weekly.  FAMILY HISTORY:  Noncontributory  PRIMARY CARE PHYSICIAN:  Titus Dubin. Alwyn Ren, M.D.  SOCIAL HISTORY:  The patient [place] one-level home [place]  Independent prior to admission.  She has a cane and a walker.  She has three steps to entry and denies any tobacco or alcohol use.  She has one brother who can assist and one daughter who will stay with her after discharge.  REVIEW OF SYSTEMS:  Denies any chest pain, shortness of breath, nausea or vomiting.  HOSPITAL COURSE:  The patient was admitted to rehab for comprehensive inpatient rehabilitation on November 28, 2000, where she received more than three hours of PT/OT therapy daily.  The patient did fairly well during her seven-day stay in rehab.  Besides constipation, there was no major medical complications during her rehab stay.  The patient remained on Coumadin for DVT prophylaxis and on Lovenox until the INR was therapeutic.  The patient had bilateral venous dopplers performed on November 28, 2000, which demonstrated no evidence of DVT or superficial thrombus or stasis cyst.  The patients pain was managed with OxyContin and oxycodone.  The latest labs indicated that her INR prior to discharge was 2.6, white blood cell count 1.6, hemoglobin 10.8, hematocrit 31.4, and platelets 206.  Sodium 135, potassium 4.0, glucose 118, BUN 11, creatinine 0.8.  At time of discharge all findings were stable.  Surgical incision demonstrated mild erythematous, staples intact, and there were no signs of infection. There was much less swelling.  All vitals were stable.  She was discharged home with her daughter.  At time of discharge physical therapy report indicated that the patient was ambulating modified independent 150 feet with using standard walker, transfer sit to stand was modified independently,  and she performed all ADLs with modified independently.  DISCHARGE MEDICATIONS: 1. OxyContin 10 mg 1 tablet q.12h. 2. Oxycodone 5 mg 1-2 tablets q.4-6h. as needed for pain. 3. Coumadin 3 mg take 2 tablets q.d. in p.m. through December 27, 2000, and then    stop. 4. Fosamax 70 mg 1 tablet  every Sunday. 5. Multivitamin 1 tablet q.d.  DISCHARGE INSTRUCTIONS:  The patient is to use walker and she still is posture weightbearing 50% on left.  Staples were intact.  She was to report to Dr. Ophelia Charter office on Monday or Tuesday to have staples removed.  She will receive advanced home health care for physical therapy, occupational therapy, and nursing.  They willl monitor Coumadin and her first draw wil be Monday,  December 10, 2000.  She is to follow up with Dr. Ophelia Charter within two weeks for an appointment.  Followup with primary care physician, Dr, Marga Melnick, within four to six weeks, and followup Dr. Reuel Boom L. Collins as needed. DD:  12/06/00 TD:  12/08/00 Job: [id number]

## 2010-10-07 NOTE — Discharge Summary (Signed)
Christina Martin, Christina Martin                ACCOUNT NO.:  [id number]   MEDICAL RECORD NO.:  [id number]          PATIENT TYPE:  INP   LOCATION:  5018                         FACILITY:  [facility]   PHYSICIAN:  Mark C. Ophelia Charter, M.D.    DATE OF BIRTH:  Nov 30, 1924   DATE OF ADMISSION:  01/13/2008  DATE OF DISCHARGE:  01/18/2008                               DISCHARGE SUMMARY   ADMISSION DIAGNOSES:  1. Severe lumbar spinal stenosis, L4-5 and L5-S1.  2. Obesity.  3. Gastroesophageal reflux disease.  4. Urinary stress incontinence.  5. History of hepatitis.  6. History of pneumonia.  7. Hypertension.  8. Metabolic syndrome X.  9. History of colon polyps.  10.History of uterine cancer.  11.Bilateral total knee replacements.   DISCHARGE DIAGNOSES:  1. Severe lumbar spinal stenosis, L4-5 and L5-S1.  2. Obesity.  3. Gastroesophageal reflux disease.  4. Urinary stress incontinence.  5. History of hepatitis.  6. History of pneumonia.  7. Hypertension.  8. Metabolic syndrome X.  9. History of colon polyps.  10.History of uterine cancer.  11.Bilateral total knee replacements.  12.Postoperative urinary retention, requiring indwelling Foley      catheter at discharge.  13.Posthemorrhagic anemia.  14.Ileus, resolved at discharge.   PROCEDURE:  On January 13, 2008, the patient underwent central  decompression, L4-5 and L5-S1, microscope assisted.  Also, repair of  dural tear.  This was performed by Dr. Ophelia Charter, assisted by Maud Deed,  PA-C under general anesthesia.   BRIEF HISTORY:  The patient is an 77 year old female with chronic and  progressive low back pain secondary to lumbar stenosis with neurogenic  claudication.  Her MRI scan has shown stenosis at L4-5 and L5-S1 with  significant compression.  It was felt that she would benefit from  surgical intervention and was admitted for the procedure as stated  above.   BRIEF HOSPITAL COURSE:  The patient had neurovascular motor function of  the  lower extremities intact postoperatively.  No postop headache was  noted.  The patient was started on physical therapy for ambulation and  gait training.  She did note stress incontinence and some dribbling when  she stood and tried to void.  She noted some pressure in her abdomen and  also had mild nausea.  She was noted to have a mild ileus, and her diet  was changed to clear liquids.  She was treated with Reglan and Dulcolax  suppositories.  Eventually, with the assistance of suppositories and  stool softener, she was able to have bowel movement.  The nausea  resolved, and her diet was ramped up slowly from clear liquids to diet  as tolerated.  The patient developed urinary retention once her Foley  catheter was discontinued and required reinsertion of the catheter.  A  urinalysis that showed 7-10 wbc's per high-power field, and she was  started on Cipro for UTI; however, the culture came back with no growth,  and the Cipro was discontinued.  The patient was also started on Flomax  daily.  Attempt at Foley removal and urine proved to be unsuccessful  once again,  and the Foley catheter was reinserted and remained in at  discharge.  The patient had some episodes of hypotension, and her  antihypertensive medications were held.  IV fluids were increased and  eventually she returned to be normotensive.  Physical therapy assisted  her with ambulation.  She was slow to progress, but eventually was able  to ambulate with the physical therapist as much as 90 feet.  She was  given an [employer], and was able to demonstrate ability to donn and doff  the brace.  She did need some cues for safety, but eventually after  several sessions with physical therapy, she was felt safe for transfer  to home.  The patient received a nutritional assessment.  They did  recommend the patient resume a regular diet at discharge.  Dressing  changes were done daily, and her wound was healing without signs of   infection.  The patient was able to take oral analgesics without  difficulty.   PERTINENT LABORATORY VALUES:  Admission CBC with WBC 11.4, hemoglobin  14.5, and hematocrit 44.2.  Hemoglobin 12.1 and hematocrit 35.9  postoperatively.  Coagulation studies on admission within normal limits.  Chemistry studies with BUN 28 and creatinine 1.32 on admission.  Repeat  values throughout the hospital stay showed normalization of BUN and  creatinine.  She did have one episode of hypokalemia at 3.4.  Urinalysis  on admission was negative for urinary tract infection.  Repeat on January 15, 2008, with moderate leukocyte esterase, few epithelial cells, 7-10  wbc's per high-power field, 7-10 rbc's per high-power field.  Final  urine culture on January 16, 2008, showed no growth.  EKG on admission;  normal sinus rhythm, possible anterior infarct, age undetermined, faster  rate since last tracing confirmed by Dr. Verdis Prime.   PLAN:  The patient was stable for discharge to her home.  Arrangements  made for Home Health Physical Therapy, Occupational Therapy, and a nurse  to assist with Foley catheterization care.  She will continue to wear  her Aspen LSO at all times when she is out of bed and will ambulate as  tolerated utilizing a walker.  She will avoid bending, lifting, or  twisting.  She will be allowed to shower and should change her dressing  daily.  She has no dietary restrictions at discharge.  She will follow  up with Dr. Ophelia Charter 2 weeks from the date of surgery.  She will resume  medications as taken prior to admission and was given her medication  reconciliation form with instructions to do so.  Prescriptions at  discharge include Vicodin 1-2 every 4-6 hours as needed for pain, over-  the-counter stool softener to be used daily, and laxatives as needed.  She was also given a prescription for Flomax 0.4 mg daily.  All  questions encouraged and answered.   CONDITION ON DISCHARGE:  Stable.      Wende Neighbors, P.A.      Mark C. Ophelia Charter, M.D.  Electronically Signed   SMV/MEDQ  D:  02/27/2008  T:  02/27/2008  Job:  [id number]

## 2010-10-07 NOTE — Op Note (Signed)
[facility]  Patient:    Christina Martin, Christina Martin                       MRN: [id number] Proc. Date: 11/26/00 Adm. Date:  [id number] Attending:  Jacki Cones                           Operative Report  PREOPERATIVE DIAGNOSIS:  Left knee osteoarthritis.  POSTOPERATIVE DIAGNOSIS:  Left knee osteoarthritis.  PROCEDURE:  Left total knee arthroplasty.  SURGEON:  Mark C. Ophelia Charter, M.D.  ASSISTANT:  Colon Flattery. Ollen Bowl, M.D.  ANESTHESIA:  General plus Marcaine skin local.  ESTIMATED BLOOD LOSS:  100 ml.  COMPLICATIONS:  None.  DESCRIPTION OF PROCEDURE:  After induction of general anesthesia, preoperative Ancef prophylaxis, proximal thigh tourniquet application with heel bump and a lateral post to help block and hold the leg in flexion.  The leg was prepped with Duraprep, usual impervious stockinette, Coban, total knee arthroplasty sheets, drapes, sterile skin marker, and Betadine Vi-Drape x 2 was applied and sewn to the skin.  The leg was wrapped with an Esmarch prior to tourniquet inflation.  A midline incision was made, quad tendon was split between the medial third and the lateral two-thirds.  Small cuff of tissue was left adjacent to the patella, and a medial retinacular incision was made, flipping the patella over.  There was some meniscal remnant on the lateral side, surprisingly, even though there were large lateral osteophytes anteriorly.  More significant large osteophytes were present medial on both sides of the joint.  The patella showed total loss of articular cartilage with raw bone formation and thinning down to 20 mm.  After irrigation with saline, meniscal remnants were excised, intramedullary rod was placed up the femur, and the femur was cut with a 5 degree left valgus cut.  Anterior-posterior cuts were made with a 7.  There was slight catching anteriorly and 7 would slightly catch, and it was set on the 9, pinned, and then the #7 guide  was used.  This took slightly more bone posterior off the condyle but did not notch the anterior cortex.  Notch cuts were made.  There was excellent trial fit on the femoral side.  Intramedullary alignment was used for the tibial side, a hole was drilled, AP cut was made.  An additional 2 mm of bone were taken.  Osteophytes were removed.  There were some posterior large pieces of bone as well as a fabella, which was removed.  Two of the pieces were 1 cm in size.  There was some bone palpable posterior off the femur, preventing full flexion, and this was removed with an osteotome.  No remaining posterior bone or meniscal fragments were present.  Trial insert was placed, rotation was marked, and keel was then cut into the tibia for #7 cement.  Pulsatile lavage was used, and patella was cut with oscillating saw going from facet to facet, sized for a #7, and a total of 9 mm of bone was removed.  This was slightly less than the usual 10 mm cut; however, with the patient totally denuded of articular cartilage, this accounted for the difference and this did not overstuff the patellofemoral joint.  A #7 was appropriate, keel holes for the patella were drilled.  There was slight tightness medial due to the varus deformity, and medial collateral ligament was partially released with the osteotome off the tibia,  and bump up in spacer size from 10 to 12 mm gave slightly more laxity lateral than medial and excellent balance and flexion.  After irrigation with saline solution with trial components removed, pulsatile lavage was used, cement was mixed, wound was suctioned dry, sponged dry, and cement was packed in the keel as well as on the metaphyseal cut surface of bone, and the tibial component was cemented. There was a slight gap anterolateral, and this was abated with some cement and was due to removal of the osteophyte with slight depression of the anterior cortex.  There was no overhang lateral and no  overhang medial.  Prosthesis was down in the back, and excessive cement was removed posteriorly.  Patella was cemented in and clamped.  Femoral component was press-fit, and 12 mm spacer was inserted using the [employer] component with excellent stability with flexion and extension, varus-valgus balancing.  Pulses were intact after cement was hardened.  This was checked after tourniquet deflation.  Wound was closed with 0 Ethibond in the fascia, 2-0 Vicryl in the subcutaneous tissue, skin staple closure, Marcaine infiltration in the skin, and a postop dressing with knee immobilizer.  Instrument and needle count were correct. DD:  11/26/00 TD:  11/27/00 Job: [id number]

## 2010-11-03 ENCOUNTER — Other Ambulatory Visit: Payer: Self-pay | Admitting: Internal Medicine

## 2010-11-03 NOTE — Telephone Encounter (Signed)
ANNUAL DUE 01/2011

## 2011-01-03 ENCOUNTER — Other Ambulatory Visit: Payer: Self-pay | Admitting: Internal Medicine

## 2011-01-28 ENCOUNTER — Other Ambulatory Visit: Payer: Self-pay | Admitting: Internal Medicine

## 2011-01-30 NOTE — Telephone Encounter (Signed)
Patient needs to schedule a CPX  

## 2011-01-31 ENCOUNTER — Other Ambulatory Visit: Payer: Self-pay | Admitting: Internal Medicine

## 2011-02-13 ENCOUNTER — Encounter: Payer: Self-pay | Admitting: Internal Medicine

## 2011-02-13 ENCOUNTER — Ambulatory Visit (INDEPENDENT_AMBULATORY_CARE_PROVIDER_SITE_OTHER): Payer: Medicare Other | Admitting: Internal Medicine

## 2011-02-13 VITALS — BP 110/64 | HR 79 | Temp 98.2°F | Resp 14 | Ht 62.0 in | Wt 238.0 lb

## 2011-02-13 DIAGNOSIS — Z8542 Personal history of malignant neoplasm of other parts of uterus: Secondary | ICD-10-CM

## 2011-02-13 DIAGNOSIS — I10 Essential (primary) hypertension: Secondary | ICD-10-CM

## 2011-02-13 DIAGNOSIS — R0989 Other specified symptoms and signs involving the circulatory and respiratory systems: Secondary | ICD-10-CM

## 2011-02-13 DIAGNOSIS — K5900 Constipation, unspecified: Secondary | ICD-10-CM

## 2011-02-13 DIAGNOSIS — R7309 Other abnormal glucose: Secondary | ICD-10-CM

## 2011-02-13 DIAGNOSIS — Z23 Encounter for immunization: Secondary | ICD-10-CM

## 2011-02-13 DIAGNOSIS — Z0000 Encounter for general adult medical examination without abnormal findings: Secondary | ICD-10-CM

## 2011-02-13 DIAGNOSIS — E8881 Metabolic syndrome: Secondary | ICD-10-CM

## 2011-02-13 DIAGNOSIS — K219 Gastro-esophageal reflux disease without esophagitis: Secondary | ICD-10-CM

## 2011-02-13 DIAGNOSIS — Z86010 Personal history of colonic polyps: Secondary | ICD-10-CM

## 2011-02-13 DIAGNOSIS — N312 Flaccid neuropathic bladder, not elsewhere classified: Secondary | ICD-10-CM

## 2011-02-13 DIAGNOSIS — E785 Hyperlipidemia, unspecified: Secondary | ICD-10-CM

## 2011-02-13 LAB — CBC WITH DIFFERENTIAL/PLATELET
Basophils Absolute: 0.1 10*3/uL (ref 0.0–0.1)
Basophils Relative: 0.7 % (ref 0.0–3.0)
Eosinophils Absolute: 0.2 10*3/uL (ref 0.0–0.7)
Hemoglobin: 14.6 g/dL (ref 12.0–15.0)
MCHC: 32.7 g/dL (ref 30.0–36.0)
MCV: 92.4 fl (ref 78.0–100.0)
Monocytes Absolute: 0.9 10*3/uL (ref 0.1–1.0)
Neutro Abs: 7.2 10*3/uL (ref 1.4–7.7)
RBC: 4.84 Mil/uL (ref 3.87–5.11)
RDW: 15 % — ABNORMAL HIGH (ref 11.5–14.6)

## 2011-02-13 LAB — HEPATIC FUNCTION PANEL
Albumin: 3.7 g/dL (ref 3.5–5.2)
Total Protein: 7.4 g/dL (ref 6.0–8.3)

## 2011-02-13 LAB — LIPID PANEL
Cholesterol: 202 mg/dL — ABNORMAL HIGH (ref 0–200)
HDL: 73.5 mg/dL (ref >39.00)
VLDL: 15 mg/dL (ref 0.0–40.0)

## 2011-02-13 LAB — BASIC METABOLIC PANEL
CO2: 28 mEq/L (ref 19–32)
Calcium: 9.6 mg/dL (ref 8.4–10.5)
Chloride: 102 mEq/L (ref 96–112)
Creatinine, Ser: 1.3 mg/dL — ABNORMAL HIGH (ref 0.4–1.2)
Sodium: 141 mEq/L (ref 135–145)

## 2011-02-13 LAB — TSH: TSH: 0.99 u[IU]/mL (ref 0.35–5.50)

## 2011-02-13 LAB — HEMOGLOBIN A1C: Hgb A1c MFr Bld: 5.8 % (ref 4.6–6.5)

## 2011-02-13 NOTE — Progress Notes (Signed)
Subjective:    Patient ID: Christina Martin, female    DOB: 11-03-1924, 77 y.o.   MRN: [id number]  HPI Medicare Wellness Visit:  The following psychosocial & medical history were reviewed as required by Medicare.   Social history: caffeine: none , alcohol:  no ,  tobacco use : quit 1986  & exercise : none.   Home & personal  safety / fall risk: yes , using rolling walker, activities of daily living: no limitations , seatbelt use : yes , and smoke alarm employment : yes .  Power of Attorney/Living Will status : yes  Vision ( as recorded per Nurse) & Hearing  evaluation :  Ophth  Exam 02/15/2011; marked hearing loss bilaterally ( driving risk discussed with her & daughter) . Orientation :oriented X 3 , memory & recall :excellent , spelling or math testing: good,and mood & affect : no . Depression / anxiety: denied Travel history : [place] 1994 , immunization status :Shingles & Pneumovax needed (discussed) , transfusion history:  no, and preventive health surveillance ( colonoscopies, BMD , etc as per protocol/ [facility]): as per GI, Dental care:  Overdue several years . Chart reviewed &  Updated. Active issues reviewed & addressed.       Review of Systems HYPERTENSION: Disease Monitoring: Blood pressure range-not checked  Chest pain, palpitations- no       Dyspnea-  Only DOE, no PNDyspnea Medications: Compliance- yes  Lightheadedness,Syncope- no    Edema- some  Metabolic Syndrome: Disease Monitoring: Blood Sugar ranges-not checked  Polyuria/phagia/dipsia- no       Visual problems- no Medications: Compliance- no meds for glucose   HYPERLIPIDEMIA: Disease Monitoring: See symptoms for Hypertension Medications: Compliance- no meds   ROS:Abd pain, bowel changes- chronic constipation   Muscle aches- no        Objective:   Physical Exam Gen.:  well-nourished in appearance. Alert, appropriate and cooperative throughout exam. Weight excess Head: Normocephalic without obvious  abnormalities Eyes: No corneal or conjunctival inflammation noted. Extraocular motion intact.  Ears: External  ear exam reveals no significant lesions or deformities. Canals:wax . Nose: External nasal exam reveals no deformity or inflammation. Nasal mucosa are pink and moist. No lesions or exudates noted.  Mouth: Oral mucosa and oropharynx reveal no lesions or exudates. Upper plate; fair repair of lower bridge. Neck: No deformities, masses, or tenderness noted. Range of motion good. Thyroid normal. Lungs: Normal respiratory effort; chest expands symmetrically. Lungs are clear to auscultation without rales, wheezes, or increased work of breathing. Heart: Normal rate and rhythm. Normal S1 and S2. No gallop, click, or rub. S4 with slurring ; no  murmur. Abdomen: Bowel sounds normal; abdomen soft and nontender. No masses, organomegaly  Noted. Umbilical hernia. Suprapubic catheter.                                                                    Musculoskeletal/extremities: Lordosis of noted of  the thoracic  spine. No clubbing, cyanosis noted. Joints : mixed arthritic hand changes; fusiform knee changes with crepitus. Nail health  good. Vascular: Carotid, radial artery, dorsalis pedis and  posterior tibial pulses are full and equal. No bruits present. Neurologic: Alert and oriented x3. Deep tendon reflexes symmetrical and normal.  Skin: Intact without suspicious lesions or rashes. Lymph: No cervical, axillary  lymphadenopathy present. Psych: Mood and affect are normal. Normally interactive                                                                                         Assessment & Plan:  #1 Medicare Wellness Exam; criteria met ; data entered #2 Problem List reviewed ; Assessment/ Recommendations made Plan: see Orders    EKG reveals poor R-wave progression in V1-V2; this was previously noted. Possible LAE is present; this is also a stable finding.

## 2011-02-13 NOTE — Patient Instructions (Signed)
Preventive Health Care: Eat a low-fat diet with lots of fruits and vegetables, up to 7-9 servings per day. Avoid obesity; your goal = waist less than 35 inches.Consume less than 30 grams of sugar per day from foods & drinks with High Fructose Corn Syrup as # 1,2,3 or #4 on label.  Please have hearing checked; I'm concerned about increased  risk while driving.

## 2011-02-13 NOTE — Progress Notes (Signed)
Addended by: Edgardo Roys on: 02/13/2011 04:42 PM   Modules accepted: Orders

## 2011-02-14 LAB — LDL CHOLESTEROL, DIRECT: Direct LDL: 116.3 mg/dL

## 2011-05-03 ENCOUNTER — Other Ambulatory Visit: Payer: Self-pay | Admitting: Internal Medicine

## 2011-06-21 DIAGNOSIS — N319 Neuromuscular dysfunction of bladder, unspecified: Secondary | ICD-10-CM | POA: Diagnosis not present

## 2011-07-10 ENCOUNTER — Other Ambulatory Visit: Payer: Self-pay | Admitting: Internal Medicine

## 2011-08-02 DIAGNOSIS — N319 Neuromuscular dysfunction of bladder, unspecified: Secondary | ICD-10-CM | POA: Diagnosis not present

## 2011-09-15 DIAGNOSIS — N319 Neuromuscular dysfunction of bladder, unspecified: Secondary | ICD-10-CM | POA: Diagnosis not present

## 2011-10-09 ENCOUNTER — Other Ambulatory Visit: Payer: Self-pay | Admitting: Internal Medicine

## 2011-10-10 DIAGNOSIS — M48061 Spinal stenosis, lumbar region without neurogenic claudication: Secondary | ICD-10-CM | POA: Diagnosis not present

## 2011-10-10 DIAGNOSIS — M545 Low back pain: Secondary | ICD-10-CM | POA: Diagnosis not present

## 2011-10-25 DIAGNOSIS — N319 Neuromuscular dysfunction of bladder, unspecified: Secondary | ICD-10-CM | POA: Diagnosis not present

## 2011-10-26 ENCOUNTER — Other Ambulatory Visit: Payer: Self-pay | Admitting: Internal Medicine

## 2011-10-27 NOTE — Telephone Encounter (Signed)
Patient needs a CPX 01/2012

## 2011-11-27 DIAGNOSIS — R339 Retention of urine, unspecified: Secondary | ICD-10-CM | POA: Diagnosis not present

## 2012-01-04 ENCOUNTER — Other Ambulatory Visit: Payer: Self-pay | Admitting: Internal Medicine

## 2012-01-04 NOTE — Telephone Encounter (Signed)
Patient will be due for CPX in 09/13

## 2012-01-08 ENCOUNTER — Other Ambulatory Visit: Payer: Self-pay | Admitting: Internal Medicine

## 2012-01-09 DIAGNOSIS — N319 Neuromuscular dysfunction of bladder, unspecified: Secondary | ICD-10-CM | POA: Diagnosis not present

## 2012-01-20 ENCOUNTER — Other Ambulatory Visit: Payer: Self-pay | Admitting: Internal Medicine

## 2012-01-26 ENCOUNTER — Other Ambulatory Visit: Payer: Self-pay | Admitting: Internal Medicine

## 2012-01-26 NOTE — Telephone Encounter (Signed)
rx sent to pharmacy by e-script for #30 with 1 refill to last pt til upcoming OV noted 03-22-12

## 2012-02-20 DIAGNOSIS — N319 Neuromuscular dysfunction of bladder, unspecified: Secondary | ICD-10-CM | POA: Diagnosis not present

## 2012-02-26 ENCOUNTER — Other Ambulatory Visit: Payer: Self-pay | Admitting: Internal Medicine

## 2012-03-22 ENCOUNTER — Encounter: Payer: Self-pay | Admitting: Internal Medicine

## 2012-03-22 ENCOUNTER — Ambulatory Visit (INDEPENDENT_AMBULATORY_CARE_PROVIDER_SITE_OTHER): Payer: Medicare Other | Admitting: Internal Medicine

## 2012-03-22 VITALS — BP 132/78 | HR 64 | Temp 98.8°F | Resp 14 | Ht 62.0 in | Wt 228.0 lb

## 2012-03-22 DIAGNOSIS — R609 Edema, unspecified: Secondary | ICD-10-CM | POA: Diagnosis not present

## 2012-03-22 DIAGNOSIS — E8881 Metabolic syndrome: Secondary | ICD-10-CM | POA: Diagnosis not present

## 2012-03-22 DIAGNOSIS — Z0000 Encounter for general adult medical examination without abnormal findings: Secondary | ICD-10-CM

## 2012-03-22 DIAGNOSIS — Z23 Encounter for immunization: Secondary | ICD-10-CM

## 2012-03-22 DIAGNOSIS — R7309 Other abnormal glucose: Secondary | ICD-10-CM

## 2012-03-22 DIAGNOSIS — K219 Gastro-esophageal reflux disease without esophagitis: Secondary | ICD-10-CM | POA: Diagnosis not present

## 2012-03-22 DIAGNOSIS — I10 Essential (primary) hypertension: Secondary | ICD-10-CM | POA: Diagnosis not present

## 2012-03-22 LAB — CBC WITH DIFFERENTIAL/PLATELET
Basophils Relative: 1 % (ref 0–1)
Eosinophils Absolute: 0.4 10*3/uL (ref 0.0–0.7)
Eosinophils Relative: 4 % (ref 0–5)
MCH: 29.5 pg (ref 26.0–34.0)
MCHC: 33.3 g/dL (ref 30.0–36.0)
MCV: 88.6 fL (ref 78.0–100.0)
Neutrophils Relative %: 51 % (ref 43–77)
Platelets: 315 10*3/uL (ref 150–400)

## 2012-03-22 MED ORDER — FUROSEMIDE 40 MG PO TABS: 40.0000 mg | ORAL_TABLET | Freq: Every day | ORAL | Status: DC

## 2012-03-22 MED ORDER — BENAZEPRIL HCL 40 MG PO TABS: 40.0000 mg | ORAL_TABLET | Freq: Every day | ORAL | Status: DC

## 2012-03-22 NOTE — Progress Notes (Signed)
Subjective:    Patient ID: Christina Martin, female    DOB: 1924/10/13, 78 y.o.   MRN: [id number]  HPI Medicare Wellness Visit:  The following psychosocial & medical history were reviewed as required by Medicare.   Social history: caffeine: minimal , alcohol:  none ,  tobacco use :quit 1988  & exercise : no.   Home & personal  safety / fall risk:see screen, employs rolling walker, activities of daily living: no limitations , seatbelt use : yes , and smoke alarm employment : yes .  Power of Attorney/Living Will status : in place  Vision ( as recorded per Nurse) & Hearing  evaluation :  Ophth exam 2012;  hearing aids from [place], checked every 3 mos Orientation :oriented X3 , memory & recall :good,  math testing:good,and mood & affect : normal . Depression / anxiety: see screen Travel history : 90 [place] , immunization status :Shingles needed , transfusion history: no , and preventive health surveillance ( colonoscopies, BMD , etc as per protocol/ [facility]): colonoscopy follow  up  Not required, Dental care:  due . Chart reviewed &  Updated. Active issues reviewed & addressed.       Review of Systems HYPERTENSION: Disease Monitoring: Blood pressure range-no monitor  Chest pain, palpitations- no      Dyspnea- no Medications: Compliance- yes  Lightheadedness,Syncope- no  Edema- occasionally  FASTING HYPERGLYCEMIA, PMH of: A1c always < 6.1 % Polyuria/phagia/dipsia- no       Visual problems-no  HYPERLIPIDEMIA: mild LDL elevation with protective HDL Disease Monitoring: See symptoms for Hypertension Medications: Compliance- statin never required  Abd pain, bowel changes- mainly constipation since 2009 surgery   Muscle aches- no          Objective:   Physical Exam  Gen.:  well-nourished in appearance. Weight excess. Alert, appropriate and cooperative throughout exam. Head: Normocephalic without obvious abnormalities  Eyes: No corneal or conjunctival inflammation noted.   Extraocular motion intact. Vision grossly normal with lenses. Ears: External  ear exam reveals no significant lesions or deformities. Hearing aids bilaterally. Nose: External nasal exam reveals no deformity or inflammation. Nasal mucosa are pink and moist. No lesions or exudates noted.   Mouth: Oral mucosa and oropharynx reveal no lesions or exudates. Upper plate. Neck: No deformities, masses, or tenderness noted. Range of motion &Thyroid  normal Lungs: Normal respiratory effort; chest expands symmetrically. Rales @ LLL resolve with repeated deep inspiration. Heart: Normal rate and rhythm. Normal S1 and S2. No gallop, click, or rub. S4 w/o murmur. Abdomen: Bowel sounds normal; abdomen soft and nontender. No masses, organomegaly or hernias noted. Suprapubic tube Genitalia:  Dr Arelia Sneddon                                Musculoskeletal/extremities: lordosis  noted of  the thoracic  spine. No clubbing, cyanosis,or edema. Range of motion  normal .Tone & strength  normal.Marked osteoarthritic finger changes . Nail health  good. Vascular: Carotid, radial artery, dorsalis pedis and  posterior tibial pulses are full and equal. No bruits present. Neurologic: Alert and oriented x3. Deep tendon reflexes symmetrical and normal.          Skin: Intact without suspicious lesions or rashes. Lymph: No cervical, axillary lymphadenopathy present. Psych: Mood and affect are normal. Normally interactive  Assessment & Plan:  #1 Medicare Wellness Exam; criteria met ; data entered #2 EKG reveals poor R-wave progression V1-2. One PVC and one PAC present Plan: see Orders

## 2012-03-22 NOTE — Patient Instructions (Addendum)
Blood Pressure Goal  Ideally is an AVERAGE < 135/85. This AVERAGE should be calculated from @ least 5-7 BP readings taken @ different times of day on different days of week. You should not respond to isolated BP readings , but rather the AVERAGE for that week Preventive Health Care: Eat a low-fat diet with lots of fruits and vegetables, up to 7-9 servings per day.  Consume less than 30 grams of sugar per day from foods & drinks with High Fructose Corn Syrup as # 1,2,3 or #4 on label. To prevent palpitations or premature beats, avoid stimulants such as decongestants, diet pills, nicotine, or caffeine (coffee, tea, cola, or chocolate) to excess.  Take the EKG to any emergency room or preop visits. There are nonspecific changes; as long as there is no new change these are not clinically significant.  If you activate My Chart; the results can be released to you as soon as they populate from the lab. If you choose not to use this program; the labs have to be reviewed, copied & mailed   causing a delay in getting the results to you.

## 2012-03-23 LAB — HEMOGLOBIN A1C: Mean Plasma Glucose: 120 mg/dL — ABNORMAL HIGH (ref <117)

## 2012-03-23 LAB — BASIC METABOLIC PANEL
BUN: 20 mg/dL (ref 6–23)
CO2: 27 mEq/L (ref 19–32)
Chloride: 100 mEq/L (ref 96–112)
Creat: 1.22 mg/dL — ABNORMAL HIGH (ref 0.50–1.10)
Glucose, Bld: 78 mg/dL (ref 70–99)

## 2012-03-23 LAB — HEPATIC FUNCTION PANEL
ALT: 18 U/L (ref 0–35)
AST: 26 U/L (ref 0–37)
Alkaline Phosphatase: 99 U/L (ref 39–117)
Indirect Bilirubin: 0.5 mg/dL (ref 0.0–0.9)
Total Protein: 6.9 g/dL (ref 6.0–8.3)

## 2012-04-03 DIAGNOSIS — R150 Incomplete defecation: Secondary | ICD-10-CM | POA: Diagnosis not present

## 2012-04-03 DIAGNOSIS — N319 Neuromuscular dysfunction of bladder, unspecified: Secondary | ICD-10-CM | POA: Diagnosis not present

## 2012-05-08 DIAGNOSIS — N319 Neuromuscular dysfunction of bladder, unspecified: Secondary | ICD-10-CM | POA: Diagnosis not present

## 2012-05-22 HISTORY — PX: OTHER SURGICAL HISTORY: SHX169

## 2012-08-07 DIAGNOSIS — N319 Neuromuscular dysfunction of bladder, unspecified: Secondary | ICD-10-CM | POA: Diagnosis not present

## 2012-08-21 DIAGNOSIS — H52209 Unspecified astigmatism, unspecified eye: Secondary | ICD-10-CM | POA: Diagnosis not present

## 2012-08-21 DIAGNOSIS — H25049 Posterior subcapsular polar age-related cataract, unspecified eye: Secondary | ICD-10-CM | POA: Diagnosis not present

## 2012-08-21 DIAGNOSIS — H2510 Age-related nuclear cataract, unspecified eye: Secondary | ICD-10-CM | POA: Diagnosis not present

## 2012-08-21 DIAGNOSIS — H524 Presbyopia: Secondary | ICD-10-CM | POA: Diagnosis not present

## 2012-09-03 DIAGNOSIS — H2510 Age-related nuclear cataract, unspecified eye: Secondary | ICD-10-CM | POA: Diagnosis not present

## 2012-09-03 DIAGNOSIS — H2589 Other age-related cataract: Secondary | ICD-10-CM | POA: Diagnosis not present

## 2012-09-03 DIAGNOSIS — H25049 Posterior subcapsular polar age-related cataract, unspecified eye: Secondary | ICD-10-CM | POA: Diagnosis not present

## 2012-09-03 DIAGNOSIS — H25019 Cortical age-related cataract, unspecified eye: Secondary | ICD-10-CM | POA: Diagnosis not present

## 2012-09-20 DIAGNOSIS — R339 Retention of urine, unspecified: Secondary | ICD-10-CM | POA: Diagnosis not present

## 2012-09-24 DIAGNOSIS — M545 Low back pain: Secondary | ICD-10-CM | POA: Diagnosis not present

## 2012-09-24 DIAGNOSIS — M48061 Spinal stenosis, lumbar region without neurogenic claudication: Secondary | ICD-10-CM | POA: Diagnosis not present

## 2012-10-15 DIAGNOSIS — H2510 Age-related nuclear cataract, unspecified eye: Secondary | ICD-10-CM | POA: Diagnosis not present

## 2012-10-15 DIAGNOSIS — H2589 Other age-related cataract: Secondary | ICD-10-CM | POA: Diagnosis not present

## 2012-10-15 DIAGNOSIS — H25019 Cortical age-related cataract, unspecified eye: Secondary | ICD-10-CM | POA: Diagnosis not present

## 2012-10-30 DIAGNOSIS — N319 Neuromuscular dysfunction of bladder, unspecified: Secondary | ICD-10-CM | POA: Diagnosis not present

## 2012-12-03 DIAGNOSIS — N319 Neuromuscular dysfunction of bladder, unspecified: Secondary | ICD-10-CM | POA: Diagnosis not present

## 2013-01-14 DIAGNOSIS — N319 Neuromuscular dysfunction of bladder, unspecified: Secondary | ICD-10-CM | POA: Diagnosis not present

## 2013-02-26 DIAGNOSIS — N319 Neuromuscular dysfunction of bladder, unspecified: Secondary | ICD-10-CM | POA: Diagnosis not present

## 2013-03-17 ENCOUNTER — Other Ambulatory Visit: Payer: Self-pay | Admitting: Internal Medicine

## 2013-03-17 NOTE — Telephone Encounter (Signed)
Benazepril refill sent to pharmacy. OV due 

## 2013-03-19 ENCOUNTER — Other Ambulatory Visit: Payer: Self-pay | Admitting: Internal Medicine

## 2013-03-29 ENCOUNTER — Other Ambulatory Visit: Payer: Self-pay | Admitting: Internal Medicine

## 2013-03-31 NOTE — Telephone Encounter (Signed)
Furosemide refill sent to pharmacy #30, 0 refills. OV due

## 2013-04-23 ENCOUNTER — Other Ambulatory Visit: Payer: Self-pay | Admitting: Internal Medicine

## 2013-04-23 NOTE — Telephone Encounter (Signed)
Furosemide refilled per protocol 

## 2013-04-25 DIAGNOSIS — R339 Retention of urine, unspecified: Secondary | ICD-10-CM | POA: Diagnosis not present

## 2013-04-25 DIAGNOSIS — Z23 Encounter for immunization: Secondary | ICD-10-CM | POA: Diagnosis not present

## 2013-05-19 ENCOUNTER — Telehealth: Payer: Self-pay

## 2013-05-19 ENCOUNTER — Other Ambulatory Visit: Payer: Self-pay | Admitting: Internal Medicine

## 2013-05-19 NOTE — Telephone Encounter (Signed)
Furosemide refilled per protocol. JG//CMA 

## 2013-05-19 NOTE — Telephone Encounter (Signed)
Medication and allergies:  Reviewed and updated  90 day supply/mail order: na Local pharmacy: [facility] [address]   Immunizations due:  Tdap and shingles  A/P:   No changes to FH or PSH or personal hx CCS--12/2004--Dr Patterson--no need for repeats Flu vaccine--04/2013 PNA--01/2011  To Discuss with Provider: Feet and ankle swelling x several weeks

## 2013-05-21 ENCOUNTER — Encounter: Payer: Self-pay | Admitting: Internal Medicine

## 2013-05-21 ENCOUNTER — Ambulatory Visit (INDEPENDENT_AMBULATORY_CARE_PROVIDER_SITE_OTHER): Payer: Medicare Other | Admitting: Internal Medicine

## 2013-05-21 ENCOUNTER — Encounter: Payer: Medicare Other | Admitting: Internal Medicine

## 2013-05-21 VITALS — BP 146/74 | HR 89 | Temp 98.1°F | Ht 64.75 in | Wt 266.0 lb

## 2013-05-21 DIAGNOSIS — E785 Hyperlipidemia, unspecified: Secondary | ICD-10-CM | POA: Diagnosis not present

## 2013-05-21 DIAGNOSIS — L988 Other specified disorders of the skin and subcutaneous tissue: Secondary | ICD-10-CM

## 2013-05-21 DIAGNOSIS — R609 Edema, unspecified: Secondary | ICD-10-CM | POA: Diagnosis not present

## 2013-05-21 DIAGNOSIS — Z86010 Personal history of colonic polyps: Secondary | ICD-10-CM | POA: Diagnosis not present

## 2013-05-21 DIAGNOSIS — Z0000 Encounter for general adult medical examination without abnormal findings: Secondary | ICD-10-CM | POA: Diagnosis not present

## 2013-05-21 DIAGNOSIS — R600 Localized edema: Secondary | ICD-10-CM

## 2013-05-21 DIAGNOSIS — E8881 Metabolic syndrome: Secondary | ICD-10-CM

## 2013-05-21 DIAGNOSIS — I10 Essential (primary) hypertension: Secondary | ICD-10-CM

## 2013-05-21 DIAGNOSIS — L959 Vasculitis limited to the skin, unspecified: Secondary | ICD-10-CM

## 2013-05-21 LAB — HEPATIC FUNCTION PANEL
ALT: 32 U/L (ref 0–35)
AST: 34 U/L (ref 0–37)
Albumin: 3.5 g/dL (ref 3.5–5.2)
Alkaline Phosphatase: 98 U/L (ref 39–117)
Bilirubin, Direct: 0.1 mg/dL (ref 0.0–0.3)
Total Protein: 7.1 g/dL (ref 6.0–8.3)

## 2013-05-21 LAB — CBC WITH DIFFERENTIAL/PLATELET
Basophils Relative: 0.6 % (ref 0.0–3.0)
Eosinophils Absolute: 0.2 10*3/uL (ref 0.0–0.7)
Eosinophils Relative: 2.1 % (ref 0.0–5.0)
HCT: 38.6 % (ref 36.0–46.0)
Hemoglobin: 12.7 g/dL (ref 12.0–15.0)
Lymphs Abs: 2 10*3/uL (ref 0.7–4.0)
Monocytes Relative: 10.3 % (ref 3.0–12.0)
Neutro Abs: 7.4 10*3/uL (ref 1.4–7.7)
Neutrophils Relative %: 68.5 % (ref 43.0–77.0)
RBC: 4.3 Mil/uL (ref 3.87–5.11)
RDW: 13.8 % (ref 11.5–14.6)

## 2013-05-21 LAB — BASIC METABOLIC PANEL
Calcium: 9.3 mg/dL (ref 8.4–10.5)
Creatinine, Ser: 1 mg/dL (ref 0.4–1.2)
GFR: 54.52 mL/min — ABNORMAL LOW (ref >60.00)
Glucose, Bld: 105 mg/dL — ABNORMAL HIGH (ref 70–99)
Potassium: 4.1 mEq/L (ref 3.5–5.1)
Sodium: 137 mEq/L (ref 135–145)

## 2013-05-21 LAB — LIPID PANEL: Cholesterol: 163 mg/dL (ref 0–200)

## 2013-05-21 LAB — T4, FREE: Free T4: 1.05 ng/dL (ref 0.60–1.60)

## 2013-05-21 NOTE — Progress Notes (Signed)
Pre visit review using our clinic review tool, if applicable. No additional management support is needed unless otherwise documented below in the visit note. 

## 2013-05-21 NOTE — Patient Instructions (Addendum)
Your next office appointment will be determined based upon review of your pending labs & x-rays. Those instructions will be transmitted to you by mail .Followup as needed for your this acute issue. Please report any significant change in your symptoms. Order for x-rays entered into  the computer; these will be performed at [address] [facility]. across from [facility]. No appointment is necessary.

## 2013-05-21 NOTE — Progress Notes (Signed)
Subjective:    Patient ID: Christina Martin, female    DOB: 08-07-1924, 79 y.o.   MRN: [id number]  HPI  Medicare Wellness Visit: Psychosocial and medical history were reviewed as required by Medicare (history related to abuse, antisocial behavior , firearm risk). Social history: Caffeine: increased chocolate , Alcohol:no  , Tobacco use: quit 1988 Exercise:no Personal safety/fall risk:no but see gait issues below Limitations of activities of daily living:help with housework Seatbelt/ smoke alarm use:yes Healthcare Power of Attorney/Living Will status: in place Ophthalmologic exam status:current Hearing evaluation status:hearing aids Orientation: Oriented but can't give President or Governor ("I don't keep up with that") . Excellent recall of details of PMH/ surgeries Memory and recall: good Math testing: good Depression/anxiety assessment: no Foreign travel history:never Immunization status for influenza/pneumonia/ shingles /tetanus: Shingles declined Transfusion history:no Preventive health care maintenance status: Colonoscopy/BMD/mammogram/Pap as per protocol/standard care: aged out of colonoscopies. Not interested in other survelliance due to active health issues Dental care:dentures Chart reviewed and updated. Active issues reviewed and addressed as documented below.    Review of Systems   She has had increasing edema of the lower extremities which has been progressive despite continuing Lasix . This has been  associated with burning in feet &some papular dermatitis . There's been no specific trigger such as increased sodium intake. She is not on Amlodipine.  "I was going to make an appointment to treat this; but I waited as I already had this annual exam scheduled".  She has had increasing difficulty ambulating particularly stepping up. She questions whether this is related to the edema.  She denies chest pain, palpitations, or paroxysmal nocturnal dyspnea. She does sleep poorly  but she is taking daytime naps  Her sleep pattern has not been observed in reference to excessive snoring or apneic spells.  Her urologist has found bladder stones; he has discussed  possibly removing these as they have presented within the last year.     Objective:   Physical Exam Gen.: Significantly obese ; adequately nourished in appearance. Alert, appropriate and cooperative throughout exam. Appears younger than stated age of 30 Head: Normocephalic without obvious abnormalities  Eyes: No corneal or conjunctival inflammation noted. Pupils equal but small. Extraocular motion intact. No icterus Ears:  Hearing aids bilaterally. Nose: External nasal exam reveals no deformity or inflammation. Nasal mucosa are pink and moist. No lesions or exudates noted.  Mouth: Oral mucosa and oropharynx reveal no lesions or exudates. Lower implant in poor repair. Upper plate Neck: No deformities, masses, or tenderness noted.  Thyroid small. Lungs: Normal respiratory effort; chest expands symmetrically. Lungs are clear to auscultation without rales,or wheezes. Dyspneic with minimal exertion.Marland Kitchen Heart: Normal rate and rhythm. Normal S1 and S2. No gallop, click, or rub. Distant heart sounds. Abdomen: Protuberant.Bowel sounds normal; abdomen soft and nontender. No masses, organomegaly or hernias noted. Suprapubic tube. Genitalia:Post TAH/BSO                                 Musculoskeletal/extremities:  Accentuated curvature of upper thoracic spine. Also there is some asymmetry of the posterior thoracic musculature suggesting occult scoliosis. No clubbing or cyanosis.Tense , pittedema of legs to upper shins. Hand joints  reveal marked  DIP changes. Fingernail  health good.  Vascular: Carotid, radial artery, dorsalis pedis and  posterior tibial pulses are equal. Decreased DPP & PTP due to edema. R carotid  bruit present. Neurologic: Alert and oriented x3. Deep tendon  reflexes symmetrical but decreased.  Gait broad  ; unable to step up onto exam table step .      Skin: She has diffuse plaque like erythematous changes over the shins. These blanch with pressure suggesting a vasculitic etiology. Lymph: No cervical, axillary lymphadenopathy present. Psych: Mood and affect are normal. Normally interactive                                                                                        Assessment & Plan:  #1 Medicare Wellness Exam; criteria met ; data entered #2 Problem List/Diagnoses reviewed #3 progressive edema. Hepatorenal  & cardiopulmonary causes will be assessed #4 vasculitic dermatitis Plan:  Assessments made/ Orders entered

## 2013-05-23 ENCOUNTER — Ambulatory Visit (INDEPENDENT_AMBULATORY_CARE_PROVIDER_SITE_OTHER)
Admission: RE | Admit: 2013-05-23 | Discharge: 2013-05-23 | Disposition: A | Discharge status: Home / Self Care | Payer: Medicare Other | Source: Ambulatory Visit | Attending: Internal Medicine | Admitting: Internal Medicine

## 2013-05-23 ENCOUNTER — Encounter: Payer: Self-pay | Admitting: *Deleted

## 2013-05-23 ENCOUNTER — Telehealth: Payer: Self-pay | Admitting: Internal Medicine

## 2013-05-23 DIAGNOSIS — R600 Localized edema: Secondary | ICD-10-CM

## 2013-05-23 DIAGNOSIS — R609 Edema, unspecified: Secondary | ICD-10-CM

## 2013-05-23 DIAGNOSIS — R0602 Shortness of breath: Secondary | ICD-10-CM | POA: Diagnosis not present

## 2013-05-23 DIAGNOSIS — I10 Essential (primary) hypertension: Secondary | ICD-10-CM | POA: Diagnosis not present

## 2013-05-23 LAB — TSH: TSH: 2.17 u[IU]/mL (ref 0.35–5.50)

## 2013-05-23 NOTE — Telephone Encounter (Signed)
Patient's daughter is returning missed call from Shanon Brow about patient's results. Please call patient back.

## 2013-05-26 ENCOUNTER — Telehealth: Payer: Self-pay | Admitting: *Deleted

## 2013-05-26 ENCOUNTER — Other Ambulatory Visit: Payer: Self-pay | Admitting: Internal Medicine

## 2013-05-26 DIAGNOSIS — R609 Edema, unspecified: Secondary | ICD-10-CM

## 2013-05-26 NOTE — Telephone Encounter (Signed)
Patient's daughter states that the swelling in the pt's legs is worsening and she wants to know what Dr. Linna Darner suggests. Patient is keeping her feet propped up at home and this is not working. Please advise.

## 2013-05-26 NOTE — Telephone Encounter (Signed)
ECHO ordered to assess heart function.Restrict salt  & increase Lasix (furosamide ) to bid

## 2013-05-26 NOTE — Telephone Encounter (Signed)
Please advise 

## 2013-05-26 NOTE — Telephone Encounter (Signed)
Spoke with patient and made aware of Dr. Clayborn Heron recommendations for restricting salt and increase Lasix to BID. Patient able to verbalize understanding and repeat instructions.  Please call patients daughter Christina Martin when Echo appt is made because she takes patient to all appts.

## 2013-05-30 ENCOUNTER — Other Ambulatory Visit: Payer: Self-pay | Admitting: Internal Medicine

## 2013-05-30 ENCOUNTER — Encounter: Payer: Self-pay | Admitting: Internal Medicine

## 2013-05-30 ENCOUNTER — Ambulatory Visit (HOSPITAL_COMMUNITY): Payer: Medicare Other | Attending: Internal Medicine | Admitting: Cardiology

## 2013-05-30 DIAGNOSIS — R609 Edema, unspecified: Secondary | ICD-10-CM | POA: Insufficient documentation

## 2013-05-30 DIAGNOSIS — I519 Heart disease, unspecified: Secondary | ICD-10-CM

## 2013-05-30 DIAGNOSIS — I10 Essential (primary) hypertension: Secondary | ICD-10-CM | POA: Diagnosis not present

## 2013-05-30 DIAGNOSIS — I079 Rheumatic tricuspid valve disease, unspecified: Secondary | ICD-10-CM | POA: Diagnosis not present

## 2013-05-30 DIAGNOSIS — E785 Hyperlipidemia, unspecified: Secondary | ICD-10-CM | POA: Diagnosis not present

## 2013-05-30 DIAGNOSIS — I379 Nonrheumatic pulmonary valve disorder, unspecified: Secondary | ICD-10-CM | POA: Diagnosis not present

## 2013-05-30 DIAGNOSIS — I059 Rheumatic mitral valve disease, unspecified: Secondary | ICD-10-CM | POA: Insufficient documentation

## 2013-05-30 HISTORY — DX: Heart disease, unspecified: I51.9

## 2013-05-30 NOTE — Progress Notes (Signed)
Echo performed. 

## 2013-06-02 ENCOUNTER — Telehealth: Payer: Self-pay | Admitting: *Deleted

## 2013-06-02 NOTE — Telephone Encounter (Signed)
Spoke with patient regarding echo results and made aware that cards can help devise a plan for treatment which would help with swelling. Patient verbalized understanding and agrees to see cards.

## 2013-06-03 ENCOUNTER — Encounter: Payer: Self-pay | Admitting: Cardiovascular Disease

## 2013-06-03 ENCOUNTER — Ambulatory Visit (INDEPENDENT_AMBULATORY_CARE_PROVIDER_SITE_OTHER): Payer: Medicare Other | Admitting: Cardiovascular Disease

## 2013-06-03 VITALS — BP 110/60 | HR 88 | Ht 64.0 in | Wt 258.0 lb

## 2013-06-03 DIAGNOSIS — I509 Heart failure, unspecified: Secondary | ICD-10-CM

## 2013-06-03 DIAGNOSIS — I5033 Acute on chronic diastolic (congestive) heart failure: Secondary | ICD-10-CM

## 2013-06-03 LAB — BASIC METABOLIC PANEL
BUN: 16 mg/dL (ref 6–23)
CHLORIDE: 99 meq/L (ref 96–112)
CO2: 33 mEq/L — ABNORMAL HIGH (ref 19–32)
Calcium: 9.5 mg/dL (ref 8.4–10.5)
Creatinine, Ser: 1.2 mg/dL (ref 0.4–1.2)
GFR: 46.53 mL/min — ABNORMAL LOW (ref >60.00)
Glucose, Bld: 100 mg/dL — ABNORMAL HIGH (ref 70–99)
Potassium: 4.2 mEq/L (ref 3.5–5.1)
Sodium: 139 mEq/L (ref 135–145)

## 2013-06-03 MED ORDER — FUROSEMIDE 80 MG PO TABS
80.0000 mg | ORAL_TABLET | Freq: Two times a day (BID) | ORAL | Status: DC
Start: 2013-06-03 — End: 2013-08-28

## 2013-06-03 MED ORDER — POTASSIUM CHLORIDE CRYS ER 20 MEQ PO TBCR: 20.0000 meq | EXTENDED_RELEASE_TABLET | Freq: Every day | ORAL | Status: DC

## 2013-06-03 NOTE — Progress Notes (Signed)
History of Present Illness: 80 yo female with history of HTN, GERD, neurogenic bladder with suprapubic catheter here today for cardiac evaluation. She has had lower extremity edema for the past two months. This is associated with pain from the edema. NO skin breakdown. No chest pain. She does note dyspnea with exertion. Echo per Dr. Linna Darner with normal LV systolic function with diastolic dysfunction, mild MR. No prior cardiac conditions.   Primary Care Physician: Linna Darner  Last Lipid Profile:Lipid Panel     Component Value Date/Time   CHOL 163 05/21/2013 1233   TRIG 45.0 05/21/2013 1233   HDL 69.70 05/21/2013 1233   CHOLHDL 2 05/21/2013 1233   VLDL 9.0 05/21/2013 1233   [place] 84 05/21/2013 1233     Past Medical History  Diagnosis Date  . Nonspecific elevation of levels of transaminase or lactic acid dehydrogenase (LDH) 1996    + Hepatitis srology  . GERD (gastroesophageal reflux disease)   . Hypertension   . Atonic neurogenic bladder     Past Surgical History  Procedure Laterality Date  . Esophageal dilation  2006  . Total knee arthroplasty  1996 & 2002  . Appendectomy    . Total abdominal hysterectomy w/ bilateral salpingoophorectomy  1990    uterine cancer  . Colonoscopy w/ polypectomy  1998    Tics in 2006; Dr Sharlett Iles  . Sbo  1991    adhesions resected  . Posterior laminectomy / decompression lumbar spine  2009    Dr Lorin Mercy  . Cataract surgery  2014    bilateral; Dr Prudencio Burly    Current Outpatient Prescriptions  Medication Sig Dispense Refill  . acetaminophen (TYLENOL) 325 MG tablet Take 650 mg by mouth as needed.        . benazepril (LOTENSIN) 40 MG tablet TAKE 1 TABLET (40 MG TOTAL) BY MOUTH DAILY.  30 tablet  1  . Calcium Carbonate-Vitamin D (CALCIUM 600 + D PO) Take by mouth. 2 by mouth daily       . Docusate Calcium (STOOL SOFTENER PO) Take by mouth daily.        . furosemide (LASIX) 40 MG tablet TAKE 1 TABLET BY MOUTH EVERY DAY  30 tablet  5  .  methylcellulose (CITRUCEL) oral powder Take by mouth daily.        . Multiple Vitamin (MULTIVITAMIN) tablet Take 1 tablet by mouth daily.        . Omega-3 Fatty Acids (FISH OIL) 1200 MG CAPS Take by mouth daily.        Marland Kitchen omeprazole (PRILOSEC) 20 MG capsule Take 20 mg by mouth. Sun, Tu,Thurs, Sat      . traMADol (ULTRAM) 50 MG tablet Take 50 mg by mouth. 2-3 x daily       . vitamin E 400 UNIT capsule Take 400 Units by mouth daily.         No current facility-administered medications for this visit.    No Known Allergies  History   Social History  . Marital Status: Widowed    Spouse Name: N/A    Number of Children: 3  . Years of Education: N/A   Occupational History  . Retired-office work    Social History Main Topics  . Smoking status: Former Smoker    Quit date: 05/22/1986  . Smokeless tobacco: Not on file     Comment: smoked 1944-1988 , up to 1 ppd  . Alcohol Use: No  . Drug Use: No  . Sexual Activity:  Not on file   Other Topics Concern  . Not on file   Social History Narrative  . No narrative on file    Family History  Problem Relation Age of Onset  . Liver disease Sister     non hepatitis  . Colon cancer Mother   . Diabetes Mother   . Diabetes Brother     CAD,CE  . Stroke Mother     > 30  . Heart attack Mother     > 45  . Heart failure Sister     Review of Systems:  As stated in the HPI and otherwise negative.   BP 110/60  Pulse 88  Ht 5\' 4"  (1.626 m)  Wt 258 lb (117.028 kg)  BMI 44.26 kg/m2  Physical Examination: General: Well developed, well nourished, NAD HEENT: OP clear, mucus membranes moist SKIN: warm, dry. No rashes. Neuro: No focal deficits Musculoskeletal: Muscle strength 5/5 all ext Psychiatric: Mood and affect normal Neck: No JVD, no carotid bruits, no thyromegaly, no lymphadenopathy. Lungs:Clear bilaterally, no wheezes, rhonci, crackles Cardiovascular: Regular rate and rhythm. No murmurs, gallops or rubs. Abdomen:Soft. Bowel  sounds present. Non-tender.  Extremities: 2+ bilateral lower extremity edema with chronic venous stasis changes. Pulses are non-palpable.   EKG: NSR, rate 88 bpm. Poor R wave progression.   Echo 05/30/13:  Left ventricle: The cavity size was normal. There was mild focal basal and mild concentric hypertrophy of the septum. Systolic function was normal. The estimated ejection fraction was in the range of 60% to 65%. Wall motion was normal; there were no regional wall motion abnormalities. Doppler parameters are consistent with abnormal left ventricular relaxation (grade 1 diastolic dysfunction). The E/e' ratio is >15, suggesting markedly elevated LV filling pressure. - Mitral valve: Posterior calcified annulus. Mild regurgitation. - Left atrium: The atrium was at the upper limits of normal in size. - Right ventricle: The cavity size was normal. Wall thickness was normal. The moderator band was prominent. Systolic function was normal. RV systolic pressure: 66YQ Hg (S, est). - Right atrium: The atrium was mildly dilated (18.5 cm2). - Atrial septum: Bowing of the IAS from left to right suggests high LA pressure. - Tricuspid valve: Dilated annulus at 4.07 cm. Moderate regurgitation. - Pulmonic valve: Trivial regurgitation. - Inferior vena cava: The vessel was normal in size; the respirophasic diameter changes were in the normal range (= 50%); findings are consistent with normal central venous pressure. - Pericardium, extracardiac: There was no pericardial effusion.  Assessment and Plan:   1. Acute on chronic diastolic CHF: She has volume overload. Her echo shows normal LV systolic function but she does have LVH and evidence of diastolic dysfunction. I suspect that her lower extremity edema is secondary to her diastolic dysfunction. Will start with titrating her Lasix to 80 mg po BID. Will add KDur 67meq po daily. Check BMET today. I do not think a right heart cath is indicated in this  elderly female. I am hopeful that we can manage her volume excess and lower extremity edema with diuretics alone. She will elevate legs, limit salt intake. She cannot tolerate compression stockings.

## 2013-06-03 NOTE — Patient Instructions (Addendum)
Your physician recommends that you schedule a follow-up appointment in: 3-4 weeks. --Scheduled for July 01, 2013 at 2:00  Your physician has recommended you make the following change in your medication: Increase furosemide to 80 mg by mouth twice daily. Start potassium 20 meq by mouth daily

## 2013-06-04 ENCOUNTER — Encounter: Payer: Self-pay | Admitting: *Deleted

## 2013-06-04 DIAGNOSIS — N319 Neuromuscular dysfunction of bladder, unspecified: Secondary | ICD-10-CM | POA: Diagnosis not present

## 2013-06-04 DIAGNOSIS — R339 Retention of urine, unspecified: Secondary | ICD-10-CM | POA: Diagnosis not present

## 2013-06-17 ENCOUNTER — Other Ambulatory Visit: Payer: Self-pay | Admitting: Internal Medicine

## 2013-06-18 NOTE — Telephone Encounter (Signed)
Benazepril refilled per protocol. JG//CMA 

## 2013-07-01 ENCOUNTER — Encounter: Payer: Self-pay | Admitting: Cardiovascular Disease

## 2013-07-01 ENCOUNTER — Encounter (INDEPENDENT_AMBULATORY_CARE_PROVIDER_SITE_OTHER): Payer: Self-pay

## 2013-07-01 ENCOUNTER — Ambulatory Visit (INDEPENDENT_AMBULATORY_CARE_PROVIDER_SITE_OTHER): Payer: Medicare Other | Admitting: Cardiovascular Disease

## 2013-07-01 VITALS — BP 110/80 | HR 68 | Ht 63.0 in | Wt 241.0 lb

## 2013-07-01 DIAGNOSIS — I509 Heart failure, unspecified: Secondary | ICD-10-CM

## 2013-07-01 DIAGNOSIS — I5033 Acute on chronic diastolic (congestive) heart failure: Secondary | ICD-10-CM | POA: Diagnosis not present

## 2013-07-01 LAB — BASIC METABOLIC PANEL
BUN: 27 mg/dL — ABNORMAL HIGH (ref 6–23)
CALCIUM: 9.7 mg/dL (ref 8.4–10.5)
CO2: 31 mEq/L (ref 19–32)
Chloride: 98 mEq/L (ref 96–112)
Creatinine, Ser: 1.4 mg/dL — ABNORMAL HIGH (ref 0.4–1.2)
GFR: 39.44 mL/min — ABNORMAL LOW (ref >60.00)
GLUCOSE: 96 mg/dL (ref 70–99)
Potassium: 4.2 mEq/L (ref 3.5–5.1)
Sodium: 138 mEq/L (ref 135–145)

## 2013-07-01 NOTE — Progress Notes (Signed)
History of Present Illness: 80 yo female with history of HTN, GERD, neurogenic bladder with suprapubic catheter here today for cardiac followup. She was seen as a new patient 06/03/13 for evaluation of lower ext edema. She has had lower extremity edema for the past 3-4 months. This is associated with pain from the edema. NO skin breakdown. No chest pain. She does note dyspnea with exertion. Echo per Dr. Linna Darner with normal LV systolic function with diastolic dysfunction, mild MR. No prior cardiac conditions. I increased her Lasix at the first visit in hopes that we could manage her with medications alone.   She is here today for follow up. She is down 17 lbs since last visit. She has been taking the extra Lasix and she has been elevating her legs while sitting. LE edema is improved. No other complaints.   Primary Care Physician: Linna Darner  Last Lipid Profile:Lipid Panel     Component Value Date/Time   CHOL 163 05/21/2013 1233   TRIG 45.0 05/21/2013 1233   HDL 69.70 05/21/2013 1233   CHOLHDL 2 05/21/2013 1233   VLDL 9.0 05/21/2013 1233   [place] 84 05/21/2013 1233     Past Medical History  Diagnosis Date  . Nonspecific elevation of levels of transaminase or lactic acid dehydrogenase (LDH) 1996    + Hepatitis srology  . GERD (gastroesophageal reflux disease)   . Hypertension   . Atonic neurogenic bladder     Past Surgical History  Procedure Laterality Date  . Esophageal dilation  2006  . Total knee arthroplasty  1996 & 2002  . Appendectomy    . Total abdominal hysterectomy w/ bilateral salpingoophorectomy  1990    uterine cancer  . Colonoscopy w/ polypectomy  1998    Tics in 2006; Dr Sharlett Iles  . Sbo  1991    adhesions resected  . Posterior laminectomy / decompression lumbar spine  2009    Dr Lorin Mercy  . Cataract surgery  2014    bilateral; Dr Prudencio Burly    Current Outpatient Prescriptions  Medication Sig Dispense Refill  . acetaminophen (TYLENOL) 325 MG tablet Take 650 mg by  mouth as needed.        . benazepril (LOTENSIN) 40 MG tablet TAKE 1 TABLET (40 MG TOTAL) BY MOUTH DAILY.  30 tablet  1  . benazepril (LOTENSIN) 40 MG tablet TAKE 1 TABLET (40 MG TOTAL) BY MOUTH DAILY.  30 tablet  5  . Calcium Carbonate-Vitamin D (CALCIUM 600 + D PO) Take by mouth. 2 by mouth daily       . Docusate Calcium (STOOL SOFTENER PO) Take by mouth daily.        . furosemide (LASIX) 80 MG tablet Take 1 tablet (80 mg total) by mouth 2 (two) times daily.  60 tablet  6  . methylcellulose (CITRUCEL) oral powder Take by mouth daily.        . Multiple Vitamin (MULTIVITAMIN) tablet Take 1 tablet by mouth daily.        . Omega-3 Fatty Acids (FISH OIL) 1200 MG CAPS Take by mouth daily.        Marland Kitchen omeprazole (PRILOSEC) 20 MG capsule Take 20 mg by mouth. Sun, Tu,Thurs, Sat      . potassium chloride SA (K-DUR,KLOR-CON) 20 MEQ tablet Take 1 tablet (20 mEq total) by mouth daily.  30 tablet  6  . traMADol (ULTRAM) 50 MG tablet Take 50 mg by mouth. 2-3 x daily       .  vitamin E 400 UNIT capsule Take 400 Units by mouth daily.         No current facility-administered medications for this visit.    No Known Allergies  History   Social History  . Marital Status: Widowed    Spouse Name: N/A    Number of Children: 3  . Years of Education: N/A   Occupational History  . Retired-office work    Social History Main Topics  . Smoking status: Former Smoker    Quit date: 05/22/1986  . Smokeless tobacco: Not on file     Comment: smoked 1944-1988 , up to 1 ppd  . Alcohol Use: No  . Drug Use: No  . Sexual Activity: Not on file   Other Topics Concern  . Not on file   Social History Narrative  . No narrative on file    Family History  Problem Relation Age of Onset  . Liver disease Sister     non hepatitis  . Colon cancer Mother   . Diabetes Mother   . Diabetes Brother     CAD,CE  . Stroke Mother     > 68  . Heart attack Mother     > 58  . Heart failure Sister     Review of Systems:  As  stated in the HPI and otherwise negative.   BP 110/80  Pulse 68  Ht 5\' 3"  (1.6 m)  Wt 241 lb (109.317 kg)  BMI 42.70 kg/m2  Physical Examination: General: Well developed, well nourished, NAD HEENT: OP clear, mucus membranes moist SKIN: warm, dry. No rashes. Neuro: No focal deficits Musculoskeletal: Muscle strength 5/5 all ext Psychiatric: Mood and affect normal Neck: No JVD, no carotid bruits, no thyromegaly, no lymphadenopathy. Lungs:Clear bilaterally, no wheezes, rhonci, crackles Cardiovascular: Regular rate and rhythm. No murmurs, gallops or rubs. Abdomen:Soft. Bowel sounds present. Non-tender.  Extremities: 1+ bilateral lower extremity edema with chronic venous stasis changes. Pulses are non-palpable.   Echo 05/30/13:  Left ventricle: The cavity size was normal. There was mild focal basal and mild concentric hypertrophy of the septum. Systolic function was normal. The estimated ejection fraction was in the range of 60% to 65%. Wall motion was normal; there were no regional wall motion abnormalities. Doppler parameters are consistent with abnormal left ventricular relaxation (grade 1 diastolic dysfunction). The E/e' ratio is >15, suggesting markedly elevated LV filling pressure. - Mitral valve: Posterior calcified annulus. Mild regurgitation. - Left atrium: The atrium was at the upper limits of normal in size. - Right ventricle: The cavity size was normal. Wall thickness was normal. The moderator band was prominent. Systolic function was normal. RV systolic pressure: 01UU Hg (S, est). - Right atrium: The atrium was mildly dilated (18.5 cm2). - Atrial septum: Bowing of the IAS from left to right suggests high LA pressure. - Tricuspid valve: Dilated annulus at 4.07 cm. Moderate regurgitation. - Pulmonic valve: Trivial regurgitation. - Inferior vena cava: The vessel was normal in size; the respirophasic diameter changes were in the normal range (= 50%); findings are  consistent with normal central venous pressure. - Pericardium, extracardiac: There was no pericardial effusion.  Assessment and Plan:   1. Acute on chronic diastolic CHF: Lower ext edema is much improved. Her echo shows normal LV systolic function but she does have LVH and evidence of diastolic dysfunction. Her lower extremity edema is likely secondary to her diastolic dysfunction. Doing well on Lasix. She is down 17 lbs. Continue KDur 65meq po daily. Check BMET  today. I do not think a right heart cath is indicated in this elderly female. I am hopeful that we can manage her volume excess and lower extremity edema with diuretics alone. She will elevate legs, limit salt intake. She cannot tolerate compression stockings.

## 2013-07-01 NOTE — Patient Instructions (Addendum)
Your physician recommends that you schedule a follow-up appointment in: 3 months. --Scheduled for Sep 30, 2013 at 1:45

## 2013-07-16 DIAGNOSIS — N319 Neuromuscular dysfunction of bladder, unspecified: Secondary | ICD-10-CM | POA: Diagnosis not present

## 2013-08-28 ENCOUNTER — Other Ambulatory Visit: Payer: Self-pay | Admitting: *Deleted

## 2013-08-28 DIAGNOSIS — I5033 Acute on chronic diastolic (congestive) heart failure: Secondary | ICD-10-CM

## 2013-08-28 MED ORDER — POTASSIUM CHLORIDE CRYS ER 20 MEQ PO TBCR: 20.0000 meq | EXTENDED_RELEASE_TABLET | Freq: Every day | ORAL | Status: DC

## 2013-08-28 MED ORDER — FUROSEMIDE 80 MG PO TABS: 80.0000 mg | ORAL_TABLET | Freq: Two times a day (BID) | ORAL | Status: DC

## 2013-08-29 ENCOUNTER — Other Ambulatory Visit: Payer: Self-pay | Admitting: *Deleted

## 2013-08-29 MED ORDER — BENAZEPRIL HCL 40 MG PO TABS: ORAL_TABLET | ORAL | Status: DC

## 2013-08-29 NOTE — Telephone Encounter (Signed)
Rx sent to the pharmacy by e-script.//AB/CMA 

## 2013-09-03 DIAGNOSIS — N319 Neuromuscular dysfunction of bladder, unspecified: Secondary | ICD-10-CM | POA: Diagnosis not present

## 2013-09-03 DIAGNOSIS — R339 Retention of urine, unspecified: Secondary | ICD-10-CM | POA: Diagnosis not present

## 2013-09-03 DIAGNOSIS — N210 Calculus in bladder: Secondary | ICD-10-CM | POA: Diagnosis not present

## 2013-09-30 ENCOUNTER — Ambulatory Visit (INDEPENDENT_AMBULATORY_CARE_PROVIDER_SITE_OTHER): Payer: Medicare Other | Admitting: Cardiovascular Disease

## 2013-09-30 ENCOUNTER — Encounter: Payer: Self-pay | Admitting: Cardiovascular Disease

## 2013-09-30 VITALS — BP 110/60 | HR 80 | Ht 62.0 in | Wt 238.0 lb

## 2013-09-30 DIAGNOSIS — I5032 Chronic diastolic (congestive) heart failure: Secondary | ICD-10-CM

## 2013-09-30 LAB — BASIC METABOLIC PANEL
BUN: 30 mg/dL — ABNORMAL HIGH (ref 6–23)
CO2: 30 mEq/L (ref 19–32)
CREATININE: 1.5 mg/dL — AB (ref 0.4–1.2)
Calcium: 9.4 mg/dL (ref 8.4–10.5)
Chloride: 101 mEq/L (ref 96–112)
GFR: 36.3 mL/min — AB (ref >60.00)
Glucose, Bld: 96 mg/dL (ref 70–99)
Potassium: 4.2 mEq/L (ref 3.5–5.1)
Sodium: 139 mEq/L (ref 135–145)

## 2013-09-30 NOTE — Patient Instructions (Signed)
Your physician wants you to follow-up in:  6 months. You will receive a reminder letter in the mail two months in advance. If you don't receive a letter, please call our office to schedule the follow-up appointment.   

## 2013-09-30 NOTE — Progress Notes (Signed)
History of Present Illness: 80 yo female with history of HTN, GERD, neurogenic bladder with suprapubic catheter here today for cardiac followup. She was seen as a new patient 06/03/13 for evaluation of lower ext edema. She has had lower extremity edema for the past few months.  No skin breakdown. No chest pain. She does note dyspnea with exertion. Echo per Dr. Linna Darner with normal LV systolic function with diastolic dysfunction, mild MR. No prior cardiac conditions. I increased her Lasix at the first visit in hopes that we could manage her with medications alone. I saw her 07/01/13 and she had lost 17 lbs, LE edema improved.    She is here today for follow up. She is now down 28 lbs since 05/21/14.   Primary Care Physician: Linna Darner  Last Lipid Profile:Lipid Panel     Component Value Date/Time   CHOL 163 05/21/2013 1233   TRIG 45.0 05/21/2013 1233   HDL 69.70 05/21/2013 1233   CHOLHDL 2 05/21/2013 1233   VLDL 9.0 05/21/2013 1233   [place] 84 05/21/2013 1233     Past Medical History  Diagnosis Date  . Nonspecific elevation of levels of transaminase or lactic acid dehydrogenase (LDH) 1996    + Hepatitis srology  . GERD (gastroesophageal reflux disease)   . Hypertension   . Atonic neurogenic bladder     Past Surgical History  Procedure Laterality Date  . Esophageal dilation  2006  . Total knee arthroplasty  1996 & 2002  . Appendectomy    . Total abdominal hysterectomy w/ bilateral salpingoophorectomy  1990    uterine cancer  . Colonoscopy w/ polypectomy  1998    Tics in 2006; Dr Sharlett Iles  . Sbo  1991    adhesions resected  . Posterior laminectomy / decompression lumbar spine  2009    Dr Lorin Mercy  . Cataract surgery  2014    bilateral; Dr Prudencio Burly    Current Outpatient Prescriptions  Medication Sig Dispense Refill  . acetaminophen (TYLENOL) 325 MG tablet Take 650 mg by mouth as needed.        . benazepril (LOTENSIN) 40 MG tablet TAKE 1 TABLET BY MOUTH DAILY.  90 tablet  1  .  Calcium Carbonate-Vitamin D (CALCIUM 600 + D PO) Take by mouth. 2 by mouth daily       . Docusate Calcium (STOOL SOFTENER PO) Take by mouth daily.        . furosemide (LASIX) 80 MG tablet Take 40 mg by mouth 2 (two) times daily.      . methylcellulose (CITRUCEL) oral powder Take by mouth daily.        . Multiple Vitamin (MULTIVITAMIN) tablet Take 1 tablet by mouth daily.        . Omega-3 Fatty Acids (FISH OIL) 1200 MG CAPS Take by mouth daily.        Marland Kitchen omeprazole (PRILOSEC) 20 MG capsule Take 20 mg by mouth. Sun, Tu,Thurs, Sat      . potassium chloride SA (K-DUR,KLOR-CON) 20 MEQ tablet Take 1 tablet (20 mEq total) by mouth daily.  90 tablet  1  . traMADol (ULTRAM) 50 MG tablet Take 50 mg by mouth. 2-3 x daily       . vitamin E 400 UNIT capsule Take 400 Units by mouth daily.         No current facility-administered medications for this visit.    No Known Allergies  History   Social History  . Marital Status: Widowed  Spouse Name: N/A    Number of Children: 3  . Years of Education: N/A   Occupational History  . Retired-office work    Social History Main Topics  . Smoking status: Former Smoker    Quit date: 05/22/1986  . Smokeless tobacco: Not on file     Comment: smoked 1944-1988 , up to 1 ppd  . Alcohol Use: No  . Drug Use: No  . Sexual Activity: Not on file   Other Topics Concern  . Not on file   Social History Narrative  . No narrative on file    Family History  Problem Relation Age of Onset  . Liver disease Sister     non hepatitis  . Colon cancer Mother   . Diabetes Mother   . Diabetes Brother     CAD,CE  . Stroke Mother     > 50  . Heart attack Mother     > 27  . Heart failure Sister     Review of Systems:  As stated in the HPI and otherwise negative.   BP 110/60  Pulse 80  Ht 5\' 2"  (1.575 m)  Wt 238 lb (107.956 kg)  BMI 43.52 kg/m2  Physical Examination: General: Well developed, well nourished, NAD HEENT: OP clear, mucus membranes  moist SKIN: warm, dry. No rashes. Neuro: No focal deficits Musculoskeletal: Muscle strength 5/5 all ext Psychiatric: Mood and affect normal Neck: No JVD, no carotid bruits, no thyromegaly, no lymphadenopathy. Lungs:Clear bilaterally, no wheezes, rhonci, crackles Cardiovascular: Regular rate and rhythm. No murmurs, gallops or rubs. Abdomen:Soft. Bowel sounds present. Non-tender.  Extremities: 1+ bilateral lower extremity edema with chronic venous stasis changes. Pulses are non-palpable.   Echo 05/30/13:  Left ventricle: The cavity size was normal. There was mild focal basal and mild concentric hypertrophy of the septum. Systolic function was normal. The estimated ejection fraction was in the range of 60% to 65%. Wall motion was normal; there were no regional wall motion abnormalities. Doppler parameters are consistent with abnormal left ventricular relaxation (grade 1 diastolic dysfunction). The E/e' ratio is >15, suggesting markedly elevated LV filling pressure. - Mitral valve: Posterior calcified annulus. Mild regurgitation. - Left atrium: The atrium was at the upper limits of normal in size. - Right ventricle: The cavity size was normal. Wall thickness was normal. The moderator band was prominent. Systolic function was normal. RV systolic pressure: 44IH Hg (S, est). - Right atrium: The atrium was mildly dilated (18.5 cm2). - Atrial septum: Bowing of the IAS from left to right suggests high LA pressure. - Tricuspid valve: Dilated annulus at 4.07 cm. Moderate regurgitation. - Pulmonic valve: Trivial regurgitation. - Inferior vena cava: The vessel was normal in size; the respirophasic diameter changes were in the normal range (= 50%); findings are consistent with normal central venous pressure. - Pericardium, extracardiac: There was no pericardial effusion.  Assessment and Plan:   1. Chronic diastolic CHF: Lower ext edema is much improved. Weight is down 28 lbs since  05/21/13. Her echo shows normal LV systolic function but she does have LVH and evidence of diastolic dysfunction. Her lower extremity edema is likely secondary to her diastolic dysfunction. Doing well on Lasix. Continue KDur 51meq po daily. Check BMET today. I do not think a right heart cath is indicated in this elderly female. She will elevate legs, limit salt intake. She cannot tolerate compression stockings.

## 2013-10-08 DIAGNOSIS — N319 Neuromuscular dysfunction of bladder, unspecified: Secondary | ICD-10-CM | POA: Diagnosis not present

## 2013-11-12 DIAGNOSIS — N319 Neuromuscular dysfunction of bladder, unspecified: Secondary | ICD-10-CM | POA: Diagnosis not present

## 2013-12-11 DIAGNOSIS — Z961 Presence of intraocular lens: Secondary | ICD-10-CM | POA: Diagnosis not present

## 2013-12-15 ENCOUNTER — Telehealth: Payer: Self-pay | Admitting: Internal Medicine

## 2013-12-15 NOTE — Telephone Encounter (Signed)
Pt is requesting to transfer to Dr. Larose Kells, she has medicare.

## 2013-12-17 DIAGNOSIS — N319 Neuromuscular dysfunction of bladder, unspecified: Secondary | ICD-10-CM | POA: Diagnosis not present

## 2013-12-19 NOTE — Telephone Encounter (Signed)
Please arrange a non urgent OV

## 2014-01-21 DIAGNOSIS — N319 Neuromuscular dysfunction of bladder, unspecified: Secondary | ICD-10-CM | POA: Diagnosis not present

## 2014-01-21 DIAGNOSIS — R339 Retention of urine, unspecified: Secondary | ICD-10-CM | POA: Diagnosis not present

## 2014-02-22 ENCOUNTER — Other Ambulatory Visit: Payer: Self-pay | Admitting: Cardiovascular Disease

## 2014-02-25 DIAGNOSIS — N210 Calculus in bladder: Secondary | ICD-10-CM | POA: Diagnosis not present

## 2014-02-25 DIAGNOSIS — R339 Retention of urine, unspecified: Secondary | ICD-10-CM | POA: Diagnosis not present

## 2014-03-30 ENCOUNTER — Ambulatory Visit (INDEPENDENT_AMBULATORY_CARE_PROVIDER_SITE_OTHER): Payer: Medicare Other | Admitting: Cardiovascular Disease

## 2014-03-30 ENCOUNTER — Encounter: Payer: Self-pay | Admitting: Cardiovascular Disease

## 2014-03-30 VITALS — BP 122/84 | HR 77 | Ht 62.0 in | Wt 243.0 lb

## 2014-03-30 DIAGNOSIS — I10 Essential (primary) hypertension: Secondary | ICD-10-CM | POA: Diagnosis not present

## 2014-03-30 DIAGNOSIS — I5032 Chronic diastolic (congestive) heart failure: Secondary | ICD-10-CM

## 2014-03-30 NOTE — Progress Notes (Signed)
History of Present Illness: 80 yo female with history of HTN, GERD, neurogenic bladder with suprapubic catheter here today for cardiac followup. She was seen as a new patient 06/03/13 for evaluation of lower ext edema. She has had lower extremity edema for at least a year. No skin breakdown. No chest pain. She did note dyspnea with exertion. Echo per Dr. Linna Darner with normal LV systolic function with diastolic dysfunction, mild MR. No prior cardiac conditions. I increased her Lasix at the first visit in hopes that we could manage her with medications alone. I saw her 07/01/13 and she had lost 17 lbs, LE edema improved.  I last saw her in May 2015 and she was down 28 lbs over the previous 6 months.   She is here today for follow up. She is feeling well. No chest pain or SOB. LE edema is stable. No dizziness, near syncope or near syncope. Weight stable.   Primary Care Physician: Larose Kells (has not seen yet, was [place])  Last Lipid Profile:Lipid Panel     Component Value Date/Time   CHOL 163 05/21/2013 1233   TRIG 45.0 05/21/2013 1233   HDL 69.70 05/21/2013 1233   CHOLHDL 2 05/21/2013 1233   VLDL 9.0 05/21/2013 1233   [place] 84 05/21/2013 1233     Past Medical History  Diagnosis Date  . Nonspecific elevation of levels of transaminase or lactic acid dehydrogenase (LDH) 1996    + Hepatitis srology  . GERD (gastroesophageal reflux disease)   . Hypertension   . Atonic neurogenic bladder     Past Surgical History  Procedure Laterality Date  . Esophageal dilation  2006  . Total knee arthroplasty  1996 & 2002  . Appendectomy    . Total abdominal hysterectomy w/ bilateral salpingoophorectomy  1990    uterine cancer  . Colonoscopy w/ polypectomy  1998    Tics in 2006; Dr Sharlett Iles  . Sbo  1991    adhesions resected  . Posterior laminectomy / decompression lumbar spine  2009    Dr Lorin Mercy  . Cataract surgery  2014    bilateral; Dr Prudencio Burly    Current Outpatient Prescriptions  Medication Sig  Dispense Refill  . acetaminophen (TYLENOL) 325 MG tablet Take 650 mg by mouth as needed.      . benazepril (LOTENSIN) 40 MG tablet TAKE 1 TABLET BY MOUTH DAILY. 90 tablet 1  . Calcium Carbonate-Vitamin D (CALCIUM 600 + D PO) Take by mouth. 2 by mouth daily     . Docusate Calcium (STOOL SOFTENER PO) Take by mouth daily.      . furosemide (LASIX) 80 MG tablet TAKE 1 TABLET (80 MG TOTAL) BY MOUTH 2 (TWO) TIMES DAILY. 180 tablet 1  . KLOR-CON M20 20 MEQ tablet TAKE 1 TABLET BY MOUTH DAILY 90 tablet 1  . methylcellulose (CITRUCEL) oral powder Take by mouth daily.      . Multiple Vitamin (MULTIVITAMIN) tablet Take 1 tablet by mouth daily.      . Omega-3 Fatty Acids (FISH OIL) 1200 MG CAPS Take by mouth daily.      Marland Kitchen omeprazole (PRILOSEC) 20 MG capsule Take 20 mg by mouth. Sun, Tu,Thurs, Sat    . traMADol (ULTRAM) 50 MG tablet Take 50 mg by mouth. 2-3 x daily     . vitamin E 400 UNIT capsule Take 400 Units by mouth daily.       No current facility-administered medications for this visit.    No Known Allergies  History  Social History  . Marital Status: Widowed    Spouse Name: N/A    Number of Children: 3  . Years of Education: N/A   Occupational History  . Retired-office work    Social History Main Topics  . Smoking status: Former Smoker    Quit date: 05/22/1986  . Smokeless tobacco: Not on file     Comment: smoked 1944-1988 , up to 1 ppd  . Alcohol Use: No  . Drug Use: No  . Sexual Activity: Not on file   Other Topics Concern  . Not on file   Social History Narrative    Family History  Problem Relation Age of Onset  . Liver disease Sister     non hepatitis  . Colon cancer Mother   . Diabetes Mother   . Diabetes Brother     CAD,CE  . Stroke Mother     > 6  . Heart attack Mother     > 37  . Heart failure Sister     Review of Systems:  As stated in the HPI and otherwise negative.   BP 122/84 mmHg  Pulse 77  Ht 5\' 2"  (1.575 m)  Wt 243 lb (110.224 kg)  BMI  44.43 kg/m2  Physical Examination: General: Well developed, well nourished, NAD HEENT: OP clear, mucus membranes moist SKIN: warm, dry. No rashes. Neuro: No focal deficits Musculoskeletal: Muscle strength 5/5 all ext Psychiatric: Mood and affect normal Neck: No JVD, no carotid bruits, no thyromegaly, no lymphadenopathy. Lungs:Clear bilaterally, no wheezes, rhonci, crackles Cardiovascular: Regular rate and rhythm. No murmurs, gallops or rubs. Abdomen:Soft. Bowel sounds present. Non-tender.  Extremities: 1+ bilateral lower extremity edema with chronic venous stasis changes. Pulses are non-palpable.   Echo 05/30/13:  Left ventricle: The cavity size was normal. There was mild focal basal and mild concentric hypertrophy of the septum. Systolic function was normal. The estimated ejection fraction was in the range of 60% to 65%. Wall motion was normal; there were no regional wall motion abnormalities. Doppler parameters are consistent with abnormal left ventricular relaxation (grade 1 diastolic dysfunction). The E/e' ratio is >15, suggesting markedly elevated LV filling pressure. - Mitral valve: Posterior calcified annulus. Mild regurgitation. - Left atrium: The atrium was at the upper limits of normal in size. - Right ventricle: The cavity size was normal. Wall thickness was normal. The moderator band was prominent. Systolic function was normal. RV systolic pressure: 30ZS Hg (S, est). - Right atrium: The atrium was mildly dilated (18.5 cm2). - Atrial septum: Bowing of the IAS from left to right suggests high LA pressure. - Tricuspid valve: Dilated annulus at 4.07 cm. Moderate regurgitation. - Pulmonic valve: Trivial regurgitation. - Inferior vena cava: The vessel was normal in size; the respirophasic diameter changes were in the normal range (= 50%); findings are consistent with normal central venous pressure. - Pericardium, extracardiac: There was no  pericardial Effusion.  EKG: NSR, rate 77 bpm. Poor R wave progression precordial leads.   Assessment and Plan:   1. Chronic diastolic CHF: Lower ext edema is much improved. Weight is down 28 lbs since 05/21/13. Her echo shows normal LV systolic function but she does have LVH and evidence of diastolic dysfunction. Her lower extremity edema is likely secondary to her diastolic dysfunction. Doing well on Lasix. Continue KDur 7meq po daily. Check BMET today.  She will elevate legs, limit salt intake. She cannot tolerate compression stockings.

## 2014-03-30 NOTE — Patient Instructions (Signed)
Your physician wants you to follow-up in:  6 months. You will receive a reminder letter in the mail two months in advance. If you don't receive a letter, please call our office to schedule the follow-up appointment.   

## 2014-03-31 LAB — BASIC METABOLIC PANEL
BUN: 20 mg/dL (ref 6–23)
CO2: 33 mEq/L — ABNORMAL HIGH (ref 19–32)
Calcium: 9.7 mg/dL (ref 8.4–10.5)
Chloride: 99 mEq/L (ref 96–112)
Creatinine, Ser: 1.5 mg/dL — ABNORMAL HIGH (ref 0.4–1.2)
GFR: 35.69 mL/min — ABNORMAL LOW (ref >60.00)
Glucose, Bld: 109 mg/dL — ABNORMAL HIGH (ref 70–99)
POTASSIUM: 4.4 meq/L (ref 3.5–5.1)
Sodium: 140 mEq/L (ref 135–145)

## 2014-04-01 DIAGNOSIS — N319 Neuromuscular dysfunction of bladder, unspecified: Secondary | ICD-10-CM | POA: Diagnosis not present

## 2014-04-07 ENCOUNTER — Ambulatory Visit (INDEPENDENT_AMBULATORY_CARE_PROVIDER_SITE_OTHER): Payer: Medicare Other | Admitting: Internal Medicine

## 2014-04-07 ENCOUNTER — Ambulatory Visit (INDEPENDENT_AMBULATORY_CARE_PROVIDER_SITE_OTHER): Payer: Medicare Other

## 2014-04-07 ENCOUNTER — Encounter: Payer: Self-pay | Admitting: Internal Medicine

## 2014-04-07 VITALS — BP 133/54 | HR 101 | Temp 98.2°F | Wt 244.4 lb

## 2014-04-07 DIAGNOSIS — R7309 Other abnormal glucose: Secondary | ICD-10-CM

## 2014-04-07 DIAGNOSIS — I519 Heart disease, unspecified: Secondary | ICD-10-CM

## 2014-04-07 DIAGNOSIS — M150 Primary generalized (osteo)arthritis: Secondary | ICD-10-CM | POA: Diagnosis not present

## 2014-04-07 DIAGNOSIS — Z23 Encounter for immunization: Secondary | ICD-10-CM | POA: Diagnosis not present

## 2014-04-07 DIAGNOSIS — R7303 Prediabetes: Secondary | ICD-10-CM

## 2014-04-07 DIAGNOSIS — M159 Polyosteoarthritis, unspecified: Secondary | ICD-10-CM

## 2014-04-07 DIAGNOSIS — C55 Malignant neoplasm of uterus, part unspecified: Secondary | ICD-10-CM

## 2014-04-07 DIAGNOSIS — N312 Flaccid neuropathic bladder, not elsewhere classified: Secondary | ICD-10-CM

## 2014-04-07 DIAGNOSIS — I10 Essential (primary) hypertension: Secondary | ICD-10-CM

## 2014-04-07 HISTORY — DX: Prediabetes: R73.03

## 2014-04-07 LAB — CBC WITH DIFFERENTIAL/PLATELET
Basophils Absolute: 0 10*3/uL (ref 0.0–0.1)
Basophils Relative: 0.4 % (ref 0.0–3.0)
EOS PCT: 2.1 % (ref 0.0–5.0)
Eosinophils Absolute: 0.2 10*3/uL (ref 0.0–0.7)
HEMATOCRIT: 43.3 % (ref 36.0–46.0)
Hemoglobin: 14.1 g/dL (ref 12.0–15.0)
Lymphocytes Relative: 23.8 % (ref 12.0–46.0)
Lymphs Abs: 2.5 10*3/uL (ref 0.7–4.0)
MCHC: 32.6 g/dL (ref 30.0–36.0)
MCV: 90.5 fl (ref 78.0–100.0)
Monocytes Absolute: 1.2 10*3/uL — ABNORMAL HIGH (ref 0.1–1.0)
Monocytes Relative: 11.5 % (ref 3.0–12.0)
NEUTROS ABS: 6.5 10*3/uL (ref 1.4–7.7)
Neutrophils Relative %: 62.2 % (ref 43.0–77.0)
Platelets: 246 10*3/uL (ref 150.0–400.0)
RBC: 4.78 Mil/uL (ref 3.87–5.11)
RDW: 14.4 % (ref 11.5–15.5)
WBC: 10.4 10*3/uL (ref 4.0–10.5)

## 2014-04-07 LAB — HEMOGLOBIN A1C: HEMOGLOBIN A1C: 5.8 % (ref 4.6–6.5)

## 2014-04-07 MED ORDER — TRAMADOL HCL 50 MG PO TABS: 50.0000 mg | ORAL_TABLET | Freq: Three times a day (TID) | ORAL | Status: DC | PRN

## 2014-04-07 NOTE — Progress Notes (Signed)
Subjective:    Patient ID: Christina Martin, female    DOB: February 23, 1925, 80 y.o.   MRN: [id number]  DOS:  04/07/2014 Type of visit - description : transferring from [place], chart is reviewed Interval history: DJD, currently on tramadol 2 or 3 times a day, likes to transfer the prescription to this office Hypertension and lower extremity edema: Recently seen by cardiology, note reviewed, much improved. GERD, on PPIs, essentially asymptomatic    ROS Denies chest pain or difficulty breathing, she does have DOE which is a stable No nausea, vomiting, diarrhea or blood in the stools. No cough or wheezing  Past Medical History  Diagnosis Date  . Nonspecific elevation of levels of transaminase or lactic acid dehydrogenase (LDH) 1996    + Hepatitis srology  . GERD (gastroesophageal reflux disease)   . Atonic neurogenic bladder   . HTN (hypertension) 12/30/2007  . Diastolic dysfunction, left ventricle 05/30/2013    05/2013 see ECHO   . DJD (degenerative joint disease)   . Uterine cancer  (h/O)   . ATONIC BLADDER-- has a permananet suprapubic catheter    . Prediabetes 04/07/2014    Past Surgical History  Procedure Laterality Date  . Esophageal dilation  2006  . Total knee arthroplasty  1996 & 2002  . Appendectomy    . Total abdominal hysterectomy w/ bilateral salpingoophorectomy  1990    uterine cancer  . Colonoscopy w/ polypectomy  1998    Tics in 2006; Dr Sharlett Iles  . Sbo  1991    adhesions resected  . Posterior laminectomy / decompression lumbar spine  2009    Dr Lorin Mercy  . Cataract surgery  2014    bilateral; Dr Prudencio Burly    History   Social History  . Marital Status: Widowed    Spouse Name: N/A    Number of Children: 3  . Years of Education: N/A   Occupational History  . Retired-office work    Social History Main Topics  . Smoking status: Former Smoker    Quit date: 05/22/1986  . Smokeless tobacco: Not on file     Comment: smoked 1944-1988 , up to 1 ppd  . Alcohol Use:  No  . Drug Use: No  . Sexual Activity: Not on file   Other Topics Concern  . Not on file   Social History Narrative   Lives by herself, drives           Medication List       This list is accurate as of: 04/07/14  5:39 PM.  Always use your most recent med list.               acetaminophen 325 MG tablet  Commonly known as:  TYLENOL  Take 650 mg by mouth as needed.     benazepril 40 MG tablet  Commonly known as:  LOTENSIN  TAKE 1 TABLET BY MOUTH DAILY.     CALCIUM 600 + D PO  Take by mouth. 2 by mouth daily     CITRUCEL oral powder  Generic drug:  methylcellulose  Take by mouth daily.     Fish Oil 1200 MG Caps  Take by mouth daily.     furosemide 80 MG tablet  Commonly known as:  LASIX  TAKE 1 TABLET (80 MG TOTAL) BY MOUTH 2 (TWO) TIMES DAILY.     KLOR-CON M20 20 MEQ tablet  Generic drug:  potassium chloride SA  TAKE 1 TABLET BY MOUTH DAILY  multivitamin tablet  Take 1 tablet by mouth daily.     omeprazole 20 MG capsule  Commonly known as:  PRILOSEC  Take 20 mg by mouth. Sun, Tu,Thurs, Sat     STOOL SOFTENER PO  Take by mouth daily.     traMADol 50 MG tablet  Commonly known as:  ULTRAM  Take 1 tablet (50 mg total) by mouth 3 (three) times daily as needed.     vitamin E 400 UNIT capsule  Take 400 Units by mouth daily.           Objective:   Physical Exam BP 133/54 mmHg  Pulse 101  Temp(Src) 98.2 F (36.8 C) (Oral)  Wt 244 lb 6 oz (110.848 kg)  SpO2 95%  General -- alert, well-developed, NAD.  HEENT-- Not pale.   Lungs -- normal respiratory effort, no intercostal retractions, no accessory muscle use, and few end exp wheezes Heart-- normal rate, regular rhythm, no murmur.  Extremities-- +/+++  pretibial edema bilaterally  Neurologic--  alert & oriented X3. Speech normal, gait appropriate for age, strength symmetric and appropriate for age.  Psych-- Cognition and judgment appear intact. Cooperative with normal attention span and  concentration. No anxious or depressed appearing.     Assessment & Plan:

## 2014-04-07 NOTE — Assessment & Plan Note (Signed)
Mild increased blood sugar, recheck the A1c

## 2014-04-07 NOTE — Assessment & Plan Note (Signed)
Recently seen by cardiology and felt to be improved, continue with benazepril and Lasix

## 2014-04-07 NOTE — Assessment & Plan Note (Signed)
History of DJD, most of the pain is in the back and knees.  Dr. Lorin Mercy has been prescribing Ultram chronically, she usually takes 2 or 3 tablets daily, mostly 2. Would like to transfer the prescription to this office since she doesn't see Dr. Lorin Mercy anymore Plan: Prescribed tramadol today and as needed

## 2014-04-07 NOTE — Assessment & Plan Note (Signed)
Well-controlled at the present time, recent BMP is stable. Continue benazepril and Lasix

## 2014-04-07 NOTE — Patient Instructions (Signed)
Get your blood work before you leave    Please come back to the office in 3-4 months  for a physical exam. Come back fasting   

## 2014-04-07 NOTE — Progress Notes (Signed)
Pre visit review using our clinic review tool, if applicable. No additional management support is needed unless otherwise documented below in the visit note. 

## 2014-05-06 DIAGNOSIS — N319 Neuromuscular dysfunction of bladder, unspecified: Secondary | ICD-10-CM | POA: Diagnosis not present

## 2014-05-06 DIAGNOSIS — R339 Retention of urine, unspecified: Secondary | ICD-10-CM | POA: Diagnosis not present

## 2014-05-23 ENCOUNTER — Telehealth: Payer: Self-pay | Admitting: Internal Medicine

## 2014-05-25 NOTE — Telephone Encounter (Signed)
Faxed to [facility].

## 2014-05-25 NOTE — Telephone Encounter (Signed)
Done

## 2014-05-25 NOTE — Telephone Encounter (Signed)
Pt is requesting refill on Tramadol.  Last OV: 04/07/2014  Last Fill: 04/07/2014 # 90 0RF  Please advise.

## 2014-06-10 DIAGNOSIS — R339 Retention of urine, unspecified: Secondary | ICD-10-CM | POA: Diagnosis not present

## 2014-06-10 DIAGNOSIS — N319 Neuromuscular dysfunction of bladder, unspecified: Secondary | ICD-10-CM | POA: Diagnosis not present

## 2014-06-13 ENCOUNTER — Other Ambulatory Visit: Payer: Self-pay | Admitting: Internal Medicine

## 2014-06-22 ENCOUNTER — Other Ambulatory Visit: Payer: Self-pay | Admitting: Internal Medicine

## 2014-06-25 ENCOUNTER — Telehealth: Payer: Self-pay | Admitting: Internal Medicine

## 2014-06-25 ENCOUNTER — Other Ambulatory Visit: Payer: Self-pay

## 2014-06-25 MED ORDER — BENAZEPRIL HCL 40 MG PO TABS: ORAL_TABLET | ORAL | Status: DC

## 2014-06-25 NOTE — Telephone Encounter (Signed)
Looks like this refill request was sent to Dr. Linna Darner. Patient calling in for this.   [facility] [address].

## 2014-06-25 NOTE — Telephone Encounter (Signed)
Patient is out

## 2014-06-25 NOTE — Telephone Encounter (Signed)
Benazepril sent to [facility] as requested.

## 2014-06-30 ENCOUNTER — Other Ambulatory Visit: Payer: Self-pay | Admitting: Internal Medicine

## 2014-06-30 NOTE — Telephone Encounter (Signed)
Pt is requesting refill on Tramadol.  Last OV: 04/07/2014 Last Fill: 05/25/2014 # 90 0RF UDS: None  Please advise.

## 2014-06-30 NOTE — Telephone Encounter (Signed)
Print rx for 90, no RF

## 2014-07-01 NOTE — Telephone Encounter (Signed)
Faxed to [facility].

## 2014-07-15 DIAGNOSIS — R339 Retention of urine, unspecified: Secondary | ICD-10-CM | POA: Diagnosis not present

## 2014-07-15 DIAGNOSIS — N319 Neuromuscular dysfunction of bladder, unspecified: Secondary | ICD-10-CM | POA: Diagnosis not present

## 2014-07-21 ENCOUNTER — Encounter: Payer: Self-pay | Admitting: Internal Medicine

## 2014-07-21 ENCOUNTER — Ambulatory Visit (INDEPENDENT_AMBULATORY_CARE_PROVIDER_SITE_OTHER): Payer: Medicare Other | Admitting: Internal Medicine

## 2014-07-21 VITALS — BP 132/78 | HR 79 | Temp 97.6°F | Resp 16 | Ht 62.0 in | Wt 244.2 lb

## 2014-07-21 DIAGNOSIS — M150 Primary generalized (osteo)arthritis: Secondary | ICD-10-CM

## 2014-07-21 DIAGNOSIS — Z0000 Encounter for general adult medical examination without abnormal findings: Secondary | ICD-10-CM | POA: Diagnosis not present

## 2014-07-21 DIAGNOSIS — Z79891 Long term (current) use of opiate analgesic: Secondary | ICD-10-CM | POA: Diagnosis not present

## 2014-07-21 DIAGNOSIS — I519 Heart disease, unspecified: Secondary | ICD-10-CM | POA: Diagnosis not present

## 2014-07-21 DIAGNOSIS — I10 Essential (primary) hypertension: Secondary | ICD-10-CM

## 2014-07-21 DIAGNOSIS — Z23 Encounter for immunization: Secondary | ICD-10-CM

## 2014-07-21 DIAGNOSIS — M159 Polyosteoarthritis, unspecified: Secondary | ICD-10-CM

## 2014-07-21 LAB — LIPID PANEL
CHOLESTEROL: 183 mg/dL (ref 0–200)
HDL: 65 mg/dL (ref >39.00)
LDL Cholesterol: 105 mg/dL — ABNORMAL HIGH (ref 0–99)
NonHDL: 118
TRIGLYCERIDES: 65 mg/dL (ref 0.0–149.0)
Total CHOL/HDL Ratio: 3
VLDL: 13 mg/dL (ref 0.0–40.0)

## 2014-07-21 NOTE — Assessment & Plan Note (Addendum)
Controlled, continue with Lasix, potassium, benazepril. Last BMP stable. Recheck a BMP on return to the office in 4-5 months.

## 2014-07-21 NOTE — Assessment & Plan Note (Addendum)
Volume seems to be controlled, check a cholesterol panel

## 2014-07-21 NOTE — Assessment & Plan Note (Signed)
On tramadol chronically, check a UDS and refill when necessary

## 2014-07-21 NOTE — Patient Instructions (Signed)
Get your blood work before you leave    Please come back to the office in 4-5 months  for a routine check up  No  fasting     Fall Prevention and Home Safety Falls cause injuries and can affect all age groups. It is possible to use preventive measures to significantly decrease the likelihood of falls. There are many simple measures which can make your home safer and prevent falls. OUTDOORS  Repair cracks and edges of walkways and driveways.  Remove high doorway thresholds.  Trim shrubbery on the main path into your home.  Have good outside lighting.  Clear walkways of tools, rocks, debris, and clutter.  Check that handrails are not broken and are securely fastened. Both sides of steps should have handrails.  Have leaves, snow, and ice cleared regularly.  Use sand or salt on walkways during winter months.  In the garage, clean up grease or oil spills. BATHROOM  Install night lights.  Install grab bars by the toilet and in the tub and shower.  Use non-skid mats or decals in the tub or shower.  Place a plastic non-slip stool in the shower to sit on, if needed.  Keep floors dry and clean up all water on the floor immediately.  Remove soap buildup in the tub or shower on a regular basis.  Secure bath mats with non-slip, double-sided rug tape.  Remove throw rugs and tripping hazards from the floors. BEDROOMS  Install night lights.  Make sure a bedside light is easy to reach.  Do not use oversized bedding.  Keep a telephone by your bedside.  Have a firm chair with side arms to use for getting dressed.  Remove throw rugs and tripping hazards from the floor. KITCHEN  Keep handles on pots and pans turned toward the center of the stove. Use back burners when possible.  Clean up spills quickly and allow time for drying.  Avoid walking on wet floors.  Avoid hot utensils and knives.  Position shelves so they are not too high or low.  Place commonly used objects  within easy reach.  If necessary, use a sturdy step stool with a grab bar when reaching.  Keep electrical cables out of the way.  Do not use floor polish or wax that makes floors slippery. If you must use wax, use non-skid floor wax.  Remove throw rugs and tripping hazards from the floor. STAIRWAYS  Never leave objects on stairs.  Place handrails on both sides of stairways and use them. Fix any loose handrails. Make sure handrails on both sides of the stairways are as long as the stairs.  Check carpeting to make sure it is firmly attached along stairs. Make repairs to worn or loose carpet promptly.  Avoid placing throw rugs at the top or bottom of stairways, or properly secure the rug with carpet tape to prevent slippage. Get rid of throw rugs, if possible.  Have an electrician put in a light switch at the top and bottom of the stairs. OTHER FALL PREVENTION TIPS  Wear low-heel or rubber-soled shoes that are supportive and fit well. Wear closed toe shoes.  When using a stepladder, make sure it is fully opened and both spreaders are firmly locked. Do not climb a closed stepladder.  Add color or contrast paint or tape to grab bars and handrails in your home. Place contrasting color strips on first and last steps.  Learn and use mobility aids as needed. Install an electrical emergency response system.  Turn on lights to avoid dark areas. Replace light bulbs that burn out immediately. Get light switches that glow.  Arrange furniture to create clear pathways. Keep furniture in the same place.  Firmly attach carpet with non-skid or double-sided tape.  Eliminate uneven floor surfaces.  Select a carpet pattern that does not visually hide the edge of steps.  Be aware of all pets. OTHER HOME SAFETY TIPS  Set the water temperature for 120 F (48.8 C).  Keep emergency numbers on or near the telephone.  Keep smoke detectors on every level of the home and near sleeping  areas. Document Released: 04/28/2002 Document Revised: 11/07/2011 Document Reviewed: 07/28/2011 [facility] Patient Information 2015 [place], [place]. This information is not intended to replace advice given to you by your health care provider. Make sure you discuss any questions you have with your health care provider.   Preventive Care for Adults Ages 60 and over  Blood pressure check.** / Every 1 to 2 years.  Lipid and cholesterol check.**/ Every 5 years beginning at age 32.  Lung cancer screening. / Every year if you are aged 79-80 years and have a 30-pack-year history of smoking and currently smoke or have quit within the past 15 years. Yearly screening is stopped once you have quit smoking for at least 15 years or develop a health problem that would prevent you from having lung cancer treatment.  Fecal occult blood test (FOBT) of stool. / Every year beginning at age 68 and continuing until age 19. You may not have to do this test if you get a colonoscopy every 10 years.  Flexible sigmoidoscopy** or colonoscopy.** / Every 5 years for a flexible sigmoidoscopy or every 10 years for a colonoscopy beginning at age 71 and continuing until age 50.  Hepatitis C blood test.** / For all people born from [birth year] through 1963 and any individual with known risks for hepatitis C.  Abdominal aortic aneurysm (AAA) screening.** / A one-time screening for ages 87 to 67 years who are current or former smokers.  Skin self-exam. / Monthly.  Influenza vaccine. / Every year.  Tetanus, diphtheria, and acellular pertussis (Tdap/Td) vaccine.** / 1 dose of Td every 10 years.  Varicella vaccine.** / Consult your health care provider.  Zoster vaccine.** / 1 dose for adults aged 65 years or older.  Pneumococcal 13-valent conjugate (PCV13) vaccine.** / Consult your health care provider.  Pneumococcal polysaccharide (PPSV23) vaccine.** / 1 dose for all adults aged 34 years and older.  Meningococcal vaccine.** /  Consult your health care provider.  Hepatitis A vaccine.** / Consult your health care provider.  Hepatitis B vaccine.** / Consult your health care provider.  Haemophilus influenzae type b (Hib) vaccine.** / Consult your health care provider. **Family history and personal history of risk and conditions may change your health care provider's recommendations. Document Released: 07/04/2001 Document Revised: 05/13/2013 Document Reviewed: 10/03/2010 [facility] Patient Information 2015 [place], [place]. This information is not intended to replace advice given to you by your health care provider. Make sure you discuss any questions you have with your health care provider.

## 2014-07-21 NOTE — Assessment & Plan Note (Addendum)
Td --07-21-14 pnm shot 2012 Prevnar: 07-21-14 Zostavax, will discuss and return to the office  Colonoscopy 2006, screening no longer indicated due to age and patient desires.  Last mammogram 2007, normal, screening no longer indicated due to age and patient desires  History of uterine cancer, hysterectomy in the 90s, has not seen gynecology in many years, currently asymptomatic and does not desire any further eval.

## 2014-07-21 NOTE — Progress Notes (Signed)
Pre visit review using our clinic review tool, if applicable. No additional management support is needed unless otherwise documented below in the visit note. 

## 2014-07-21 NOTE — Progress Notes (Signed)
Subjective:    Patient ID: Christina Martin, female    DOB: 1924/07/09, 81 y.o.   MRN: [id number]  DOS:  07/21/2014 Type of visit - description :  Here for Medicare AWV:  1. Risk factors based on Past M, S, F history: reviewed 2. Physical Activities:  Mobile within her house w/ a walker  3. Depression/mood: neg screening  4. Hearing:  Has hearing aids  5. ADL's: independent, still drives  6. Fall Risk: had a recent mechanical  Fall, no injuries,  prevention discussed  7. home Safety: does feel safe at home  8. Height, weight, & visual acuity: see VS, sees eye doctor regulalrly 9. Counseling: provided 10. Labs ordered based on risk factors: if needed  11. Referral Coordination: if needed 12. Care Plan, see assessment and plan , written personalized plan provided  13. Cognitive Assessment: motor skills and cognition appropriate for age 9. Care team updated 15. End-of-life care discussed, has a healthcare power of attorney, rec to bring a copy   In addition, today we discussed the following: CHF, good compliance with medication, reports edema is at baseline and decreases with leg elevation Hypertension, good compliance of medication, not ambulatory BPs Neurogenic bladder, closely follow-up by urology, having problems removing the catheter. GERD, symptoms well-controlled DJD, on Ultram, mobility limited    Review of Systems Constitutional: No fever, chills. No unexplained wt changes. No unusual sweats HEENT: No dental problems, ear discharge, facial swelling, voice changes. No eye discharge, redness or intolerance to light Respiratory: No wheezing or difficulty breathing. No cough , mucus production Cardiovascular: No CP, or palpitations GI: no nausea, vomiting, diarrhea or abdominal pain.  No blood in the stools. No dysphagia   Endocrine: No polyphagia, polyuria or polydipsia GU: Denies vaginal bleeding, discharge. Musculoskeletal: No joint swellings or unusual aches or  pains Skin: No change in the color of the skin, palor or rash Allergic, immunologic: No environmental allergies or food allergies Neurological: No dizziness or syncope. No headaches. No diplopia, slurred speech, motor deficits, facial numbness Hematological: No enlarged lymph nodes, easy bruising or bleeding Psychiatry: No suicidal ideas, hallucinations, behavior problems or confusion. No unusual/severe anxiety or depression.     Past Medical History  Diagnosis Date  . Nonspecific elevation of levels of transaminase or lactic acid dehydrogenase (LDH) 1996    + Hepatitis srology  . GERD (gastroesophageal reflux disease)   . HTN (hypertension) 12/30/2007  . Diastolic dysfunction, left ventricle 05/30/2013    05/2013 see ECHO   . DJD (degenerative joint disease)   . Uterine cancer  (h/O)     no recent f/u   . ATONIC BLADDER-- has a permananet suprapubic catheter    . Prediabetes 04/07/2014    Past Surgical History  Procedure Laterality Date  . Esophageal dilation  2006  . Total knee arthroplasty  1996 & 2002  . Appendectomy    . Total abdominal hysterectomy w/ bilateral salpingoophorectomy  1990    uterine cancer  . Colonoscopy w/ polypectomy  1998    Tics in 2006; Dr Sharlett Iles  . Sbo  1991    adhesions resected  . Posterior laminectomy / decompression lumbar spine  2009    Dr Lorin Mercy  . Cataract surgery  2014    bilateral; Dr Prudencio Burly    History   Social History  . Marital Status: Widowed    Spouse Name: N/A  . Number of Children: 3  . Years of Education: N/A   Occupational History  .  Retired-office work    Social History Main Topics  . Smoking status: Former Smoker    Quit date: 05/22/1986  . Smokeless tobacco: Not on file     Comment: smoked 1944-1988 , up to 1 ppd  . Alcohol Use: No  . Drug Use: No  . Sexual Activity: Not on file   Other Topics Concern  . Not on file   Social History Narrative   Lives by herself, drives        Family History  Problem  Relation Age of Onset  . Liver disease Sister     non hepatitis  . Colon cancer Mother   . Diabetes Mother     mother, brother  . Stroke Mother     > 68  . Heart failure Sister   . CAD Brother     brother, mother , sister  . Breast cancer Neg Hx        Medication List       This list is accurate as of: 07/21/14  6:34 PM.  Always use your most recent med list.               acetaminophen 325 MG tablet  Commonly known as:  TYLENOL  Take 650 mg by mouth as needed.     benazepril 40 MG tablet  Commonly known as:  LOTENSIN  TAKE 1 TABLET BY MOUTH DAILY.     CALCIUM 600 + D PO  Take by mouth. 2 by mouth daily     CITRUCEL oral powder  Generic drug:  methylcellulose  Take by mouth daily.     Fish Oil 1200 MG Caps  Take by mouth daily.     furosemide 80 MG tablet  Commonly known as:  LASIX  TAKE 1 TABLET (80 MG TOTAL) BY MOUTH 2 (TWO) TIMES DAILY.     KLOR-CON M20 20 MEQ tablet  Generic drug:  potassium chloride SA  TAKE 1 TABLET BY MOUTH DAILY     multivitamin tablet  Take 1 tablet by mouth daily.     omeprazole 20 MG capsule  Commonly known as:  PRILOSEC  Take 20 mg by mouth. Sun, Tu,Thurs, Sat     STOOL SOFTENER PO  Take by mouth daily.     traMADol 50 MG tablet  Commonly known as:  ULTRAM  TAKE 1 TABLET BY MOUTH 3 TIMES A DAY AS NEEDED     vitamin E 400 UNIT capsule  Take 400 Units by mouth daily.           Objective:   Physical Exam BP 132/78 mmHg  Pulse 79  Temp(Src) 97.6 F (36.4 C) (Oral)  Resp 16  Ht 5\' 2"  (1.575 m)  Wt 244 lb 4 oz (110.791 kg)  BMI 44.66 kg/m2 General:   Well developed, well nourished . NAD.  Neck:  Full range of motion. Supple. No  Thyromegaly  HEENT:  Normocephalic . Face symmetric, atraumatic Lungs:  CTA B Normal respiratory effort, no intercostal retractions, no accessory muscle use. Heart: RRR,  no murmur.  Abdomen:  Not distended, soft, non-tender. Muscle skeletal: +/+++ pretibial edema bilaterally   Skin: Exposed areas without rash. Not pale. Not jaundice Neurologic:  alert & oriented X3.  Speech normal, gait appropriate for age and  Assisted by a walker. Strength symmetric and appropriate for age.  Psych: Cognition and judgment appear intact.  Cooperative with normal attention span and concentration.  Behavior appropriate. No anxious or depressed appearing.  Assessment & Plan:

## 2014-08-06 ENCOUNTER — Telehealth: Payer: Self-pay

## 2014-08-06 NOTE — Telephone Encounter (Signed)
UDS: 07/21/2014  Positive for Tramadol  Low risk per Dr. Larose Kells 08/06/2014

## 2014-08-17 ENCOUNTER — Other Ambulatory Visit: Payer: Self-pay | Admitting: Internal Medicine

## 2014-08-17 MED ORDER — TRAMADOL HCL 50 MG PO TABS: 50.0000 mg | ORAL_TABLET | Freq: Three times a day (TID) | ORAL | Status: DC | PRN

## 2014-08-17 NOTE — Telephone Encounter (Signed)
Rx printed, awaiting signature by Dr. Paz.  

## 2014-08-17 NOTE — Telephone Encounter (Signed)
Pt is requesting refill on Tramadol.  Last OV: 07/21/2014 Last Fill: 07/01/2014 #[id number] UDS: 07/21/2014 Low risk  Please advise.

## 2014-08-17 NOTE — Telephone Encounter (Signed)
Please print 2 prescriptions

## 2014-08-17 NOTE — Telephone Encounter (Signed)
Rx's faxed to [facility].

## 2014-08-19 ENCOUNTER — Other Ambulatory Visit: Payer: Self-pay | Admitting: Cardiovascular Disease

## 2014-08-25 DIAGNOSIS — R339 Retention of urine, unspecified: Secondary | ICD-10-CM | POA: Diagnosis not present

## 2014-09-01 ENCOUNTER — Ambulatory Visit (HOSPITAL_BASED_OUTPATIENT_CLINIC_OR_DEPARTMENT_OTHER): Admission: RE | Admit: 2014-09-01 | Payer: Medicare Other | Source: Ambulatory Visit

## 2014-09-01 ENCOUNTER — Ambulatory Visit (HOSPITAL_BASED_OUTPATIENT_CLINIC_OR_DEPARTMENT_OTHER)
Admission: RE | Admit: 2014-09-01 | Discharge: 2014-09-01 | Disposition: A | Discharge status: Home / Self Care | Payer: Medicare Other | Source: Ambulatory Visit | Attending: Internal Medicine | Admitting: Internal Medicine

## 2014-09-01 ENCOUNTER — Other Ambulatory Visit: Payer: Self-pay

## 2014-09-01 ENCOUNTER — Encounter: Payer: Self-pay | Admitting: Internal Medicine

## 2014-09-01 ENCOUNTER — Telehealth: Payer: Self-pay

## 2014-09-01 ENCOUNTER — Ambulatory Visit (INDEPENDENT_AMBULATORY_CARE_PROVIDER_SITE_OTHER): Payer: Medicare Other | Admitting: Internal Medicine

## 2014-09-01 VITALS — BP 128/72 | HR 70 | Temp 98.0°F | Ht 62.0 in | Wt 234.0 lb

## 2014-09-01 DIAGNOSIS — M25562 Pain in left knee: Secondary | ICD-10-CM

## 2014-09-01 DIAGNOSIS — M79605 Pain in left leg: Secondary | ICD-10-CM | POA: Diagnosis present

## 2014-09-01 DIAGNOSIS — I82412 Acute embolism and thrombosis of left femoral vein: Secondary | ICD-10-CM | POA: Insufficient documentation

## 2014-09-01 DIAGNOSIS — I82402 Acute embolism and thrombosis of unspecified deep veins of left lower extremity: Secondary | ICD-10-CM | POA: Diagnosis not present

## 2014-09-01 DIAGNOSIS — M1990 Unspecified osteoarthritis, unspecified site: Secondary | ICD-10-CM | POA: Diagnosis not present

## 2014-09-01 DIAGNOSIS — I82432 Acute embolism and thrombosis of left popliteal vein: Secondary | ICD-10-CM | POA: Diagnosis not present

## 2014-09-01 MED ORDER — LACTULOSE 10 GM/15ML PO SOLN: 10.0000 g | Freq: Every day | ORAL | Status: AC | PRN

## 2014-09-01 MED ORDER — HYDROCODONE-ACETAMINOPHEN 5-325 MG PO TABS
1.0000 | ORAL_TABLET | Freq: Three times a day (TID) | ORAL | Status: DC | PRN
Start: 2014-09-01 — End: 2014-10-02

## 2014-09-01 NOTE — Patient Instructions (Signed)
Get the ultrasound today  Pain management: Stop tramadol Take hydrocodone 1 tablet 3 times a day, okay to occasionally take 2 tablets together if the pain is severe Continue taking your fiber supplements and other OTCs for constipation Use lactulose once daily if needed. If the constipation severe please let me know  If you have fever, chills, chest pain or difficulty breathing: Call anytime  Come back in one month for a checkup

## 2014-09-01 NOTE — Progress Notes (Signed)
Subjective:    Patient ID: Christina Martin, female    DOB: [birth year], 81 y.o.   MRN: [id number]  DOS:  09/01/2014 Type of visit - description : rov Interval history:  Patient is a 81 year old female with a history of diastolic CHF, spinal stenosis, and a neurogenic bladder in today to discuss a recent reaction to Lithostat which her urologist began her on four weeks ago. She states that two weeks ago she was overcome with feelings of exhaustion, weakness, nervousness, mental confusion, and lack of appetite. She stopped the medication and since then has noticed a slow increase in symptoms but is frustrated by persistent feelings of weakness and exhaustion. She denies any headaches, dizziness, slurred speech, or other stroke-like symptoms.   The patient also mentions three days of swelling in her lower extremities, particularly her left lower leg. She states the area  tender to the touch, She has had lower leg edema in the past which has been typically well controlled by lasix. She denies any shortness of breath, fever, chills, or chest pain. She states she has plans to see her cardiologist in May.  No injury.  She notes she is currently taking Tramadol tid which is helping manage her back pain from her spinal stenosis which she had surgery on in 2009 but she mentions wanting something stronger. She states a history of constipation and is reluctant to begin any opioids.   Review of Systems  Constitutional: No fever, chills. No unexplained wt changes. No unusual sweats Respiratory: No wheezing or difficulty breathing. No cough , mucus production Cardiovascular: Bilateral leg swelling No CP or palpitations, GI: no nausea, vomiting, diarrhea or abdominal pain.  No blood in the stools. No dysphagia   Endocrine: No polyphagia, polyuria or polydipsia GU: suprapubic catheter in place, no urination Musculoskeletal: no back and shoulder pain Skin: No change in the color of the skin, palor or  rash Allergic, immunologic: No environmental allergies or food allergies Neurological: No dizziness or syncope. No headaches. No diplopia, slurred speech, motor deficits, facial numbness Hematological: No enlarged lymph nodes, easy bruising or bleeding Psychiatry: No suicidal ideas, hallucinations, behavior problems or confusion.  Patient expresses mild depression due to pain and circumstances.   Past Medical History  Diagnosis Date  . Nonspecific elevation of levels of transaminase or lactic acid dehydrogenase (LDH) 1996    + Hepatitis srology  . GERD (gastroesophageal reflux disease)   . HTN (hypertension) 12/30/2007  . Diastolic dysfunction, left ventricle 05/30/2013    05/2013 see ECHO   . DJD (degenerative joint disease)   . Uterine cancer  (h/O)     no recent f/u   . ATONIC BLADDER-- has a permananet suprapubic catheter    . Prediabetes 04/07/2014    Past Surgical History  Procedure Laterality Date  . Esophageal dilation  2006  . Total knee arthroplasty  1996 & 2002  . Appendectomy    . Total abdominal hysterectomy w/ bilateral salpingoophorectomy  1990    uterine cancer  . Colonoscopy w/ polypectomy  1998    Tics in 2006; Dr Sharlett Iles  . Sbo  1991    adhesions resected  . Posterior laminectomy / decompression lumbar spine  2009    Dr Lorin Mercy  . Cataract surgery  2014    bilateral; Dr Prudencio Burly    History   Social History  . Marital Status: Widowed    Spouse Name: N/A  . Number of Children: 3  . Years of Education: N/A  Occupational History  . Retired-office work    Social History Main Topics  . Smoking status: Former Smoker    Quit date: 05/22/1986  . Smokeless tobacco: Not on file     Comment: smoked 1944-1988 , up to 1 ppd  . Alcohol Use: No  . Drug Use: No  . Sexual Activity: Not on file   Other Topics Concern  . Not on file   Social History Narrative   Lives by herself, drives           Medication List       This list is accurate as of:  09/01/14  1:16 PM.  Always use your most recent med list.               acetaminophen 325 MG tablet  Commonly known as:  TYLENOL  Take 650 mg by mouth as needed.     benazepril 40 MG tablet  Commonly known as:  LOTENSIN  TAKE 1 TABLET BY MOUTH DAILY.     CALCIUM 600 + D PO  Take by mouth. 2 by mouth daily     CITRUCEL oral powder  Generic drug:  methylcellulose  Take by mouth daily.     Fish Oil 1200 MG Caps  Take by mouth daily.     furosemide 80 MG tablet  Commonly known as:  LASIX  TAKE 1 TABLET (80 MG TOTAL) BY MOUTH 2 (TWO) TIMES DAILY.     KLOR-CON M20 20 MEQ tablet  Generic drug:  potassium chloride SA  TAKE 1 TABLET BY MOUTH DAILY     multivitamin tablet  Take 1 tablet by mouth daily.     omeprazole 20 MG capsule  Commonly known as:  PRILOSEC  Take 20 mg by mouth. Sun, Tu,Thurs, Sat     STOOL SOFTENER PO  Take by mouth daily.     traMADol 50 MG tablet  Commonly known as:  ULTRAM  Take 1 tablet (50 mg total) by mouth 3 (three) times daily as needed.     vitamin E 400 UNIT capsule  Take 400 Units by mouth daily.           Objective:   Physical Exam Temp(Src) 98 F (36.7 C) (Oral)  Ht 5\' 2"  (1.575 m)  Wt 234 lb (106.142 kg)  BMI 42.79 kg/m2  Vitals reviewed General:   Well developed, well nourished . NAD.  HEENT:  Normocephalic . Face symmetric, atraumatic Lungs:  CTA B Normal respiratory effort, no intercostal retractions, no accessory muscle use. Heart: RRR,  no murmur  Muscle skeletal: 2+ pitting edema bilaterally with tenderness in lower legs Soft mass  felt on posterior of lower leg  Right calf circumference: 40 cm Left calf circumference: 43 cm Pedal pulses present bilaterally Capillary refill slightly slow on all toes Skin: Not pale. Not jaundice Neurologic:  alert & oriented X3.  Speech normal, currently in wheelchair Psych--  Cognition and judgment appear intact.  Cooperative with normal attention span and concentration.   Behavior appropriate. Appeared mildly depressed      Assessment & Plan:    Urologic: Lithostat started one month ago for reduction of bladder stones adhering to suprapubic catheter in place for neurogenic bladder. Literature reviewed, lithostat does cause side effects very similar to the patient's symptoms; she has been steadily improving and we discussed the nature of this recovery.   Plan: Continue to observe the progression of recovery. Patient was instructed to contact us should symptoms return or worsen.  -------  Lower leg edema: Patient sees a cardiologist for chronic diastolic CHF and presents with L>>R lower leg edema. Her left leg (43cm circumference) is significantly larger than her right (40cm circumference) and both are tender to palpation. There is no warmth or erythema. A nodule was felt mid-calf posteriorly in her left leg. She has plans for an appointment with her cardiologist in May.  DDX: DVT, cellulitis, MSK issue (muscle tear) Plan: Obtain doppler ultrasound today to check for DVT, further advise w/ results   Addendum--  preliminar [place] + DVT, plan xarelto  (per literature given age and creat needs a "reduce dose") : 20 mg one tablet daily. #10 tablets provided  Situation discussed with the patient's daughter who is aware of the diagnosis and potential complications of anticoagulation. Recommend rest, leg elevation Call anytime if there is bleeding, stomach pain, chest pain, difficulty breathing, headaches. Will discuss dosing of Xarelto with pharmacy in the morning. JP 6.54 PM Addendum-- Discuss with our pharmacist, okay to proceed with normal Xarelto dose. See phone note JP   DJD Patient has chronic lower back pain secondary to spinal stenosis for which she had back surgery on in 2009. Patient is currently taking Tramadol and would like something stronger. Patient has a history of constipation and is reluctant to try an opioid due to side effects.  Plan: We  counseled the patient on the risks and benefits of starting Vicodin, including risk of constipation-somnolence , and patient decided to begin a trial of Vicodin 5-325 mg tid as needed with Lactulose 10gm/7ml prn. She will continue with OTC laxative as needed and fiber   Today , I spent more than 45 minutes with the patient: >50% of the time counseling regards the new diagnosis of DVT and  also coordinating her care

## 2014-09-01 NOTE — Telephone Encounter (Signed)
Medical record release completed and signed by Pt for [facility] for OV notes, and labs. Faxed to [facility] on 09/01/2014, fax confirmation received on 09/01/2014 at 1318.

## 2014-09-01 NOTE — Progress Notes (Signed)
Pre visit review using our clinic review tool, if applicable. No additional management support is needed unless otherwise documented below in the visit note. 

## 2014-09-02 ENCOUNTER — Telehealth: Payer: Self-pay | Admitting: Internal Medicine

## 2014-09-02 MED ORDER — RIVAROXABAN 15 MG PO TABS: ORAL_TABLET | ORAL | Status: DC

## 2014-09-02 NOTE — Telephone Encounter (Signed)
Spoke with Arby Barrette, Pt's daughter informed her of Dr. Larose Kells recommendations. Xarelto 15 mg 1 tab bid for 3 weeks [facility]. Informed Paige in 2.5 weeks I will send Xarelto 20 mg 1 tab daily [facility]. Paige verbalized understanding. F/U appt scheduled for 10/02/2014 at 2:15 PM. Informed Paige to call if she has any questions.

## 2014-09-02 NOTE — Telephone Encounter (Signed)
Spoke with Pt's daughter Arby Barrette, informed her that # 42 [facility]. She also wanted to inform that the Pt took 1-20 mg tablet this morning and is wondering if okay to take 1-15 mg tablet tonight. Per Dr. Larose Kells okay to take the 15 mg tablet tonight. Paige verbalized understanding.

## 2014-09-02 NOTE — Telephone Encounter (Signed)
I discussed the case with our pharmacist today, based on the information is okay to proceed with a regular dose on xarelto. Please call the patient or the patient's daughter: Take xarelto 15  mg twice a day for 3 weeks Then take Xarelto 20 mg daily for 3 months Follow-up one month

## 2014-09-02 NOTE — Telephone Encounter (Signed)
Arby Barrette called back stating that the samples that she received yesterday were 20mg . Wants to make sure that the 15mg  will get her through the three weeks. Best # [phone]

## 2014-09-17 ENCOUNTER — Other Ambulatory Visit: Payer: Self-pay

## 2014-09-17 ENCOUNTER — Other Ambulatory Visit: Payer: Self-pay | Admitting: Internal Medicine

## 2014-09-17 MED ORDER — RIVAROXABAN 20 MG PO TABS: 20.0000 mg | ORAL_TABLET | Freq: Every day | ORAL | Status: DC

## 2014-09-17 NOTE — Telephone Encounter (Signed)
Xarelto 15 mg d/c, Xarelto 20 mg 1 tablet daily for 3 months, #30 and 2 refills, [facility] Pt has F/U appt scheduled for Oct 02, 2014 at 1415.

## 2014-09-18 DIAGNOSIS — R339 Retention of urine, unspecified: Secondary | ICD-10-CM | POA: Diagnosis not present

## 2014-09-18 DIAGNOSIS — N210 Calculus in bladder: Secondary | ICD-10-CM | POA: Diagnosis not present

## 2014-09-18 DIAGNOSIS — N319 Neuromuscular dysfunction of bladder, unspecified: Secondary | ICD-10-CM | POA: Diagnosis not present

## 2014-10-02 ENCOUNTER — Encounter: Payer: Self-pay | Admitting: Internal Medicine

## 2014-10-02 ENCOUNTER — Ambulatory Visit (INDEPENDENT_AMBULATORY_CARE_PROVIDER_SITE_OTHER): Payer: Medicare Other | Admitting: Internal Medicine

## 2014-10-02 VITALS — BP 128/78 | HR 72 | Temp 98.2°F | Ht 63.0 in | Wt 225.1 lb

## 2014-10-02 DIAGNOSIS — I2510 Atherosclerotic heart disease of native coronary artery without angina pectoris: Secondary | ICD-10-CM | POA: Diagnosis not present

## 2014-10-02 DIAGNOSIS — I209 Angina pectoris, unspecified: Secondary | ICD-10-CM | POA: Diagnosis not present

## 2014-10-02 DIAGNOSIS — I82402 Acute embolism and thrombosis of unspecified deep veins of left lower extremity: Secondary | ICD-10-CM

## 2014-10-02 DIAGNOSIS — N312 Flaccid neuropathic bladder, not elsewhere classified: Secondary | ICD-10-CM

## 2014-10-02 DIAGNOSIS — E119 Type 2 diabetes mellitus without complications: Secondary | ICD-10-CM | POA: Diagnosis not present

## 2014-10-02 DIAGNOSIS — M159 Polyosteoarthritis, unspecified: Secondary | ICD-10-CM

## 2014-10-02 DIAGNOSIS — M150 Primary generalized (osteo)arthritis: Secondary | ICD-10-CM | POA: Diagnosis not present

## 2014-10-02 DIAGNOSIS — R600 Localized edema: Secondary | ICD-10-CM

## 2014-10-02 DIAGNOSIS — I25119 Atherosclerotic heart disease of native coronary artery with unspecified angina pectoris: Secondary | ICD-10-CM | POA: Diagnosis not present

## 2014-10-02 LAB — BASIC METABOLIC PANEL
BUN: 28 mg/dL — ABNORMAL HIGH (ref 6–23)
CALCIUM: 9.3 mg/dL (ref 8.4–10.5)
CO2: 29 mEq/L (ref 19–32)
Chloride: 100 mEq/L (ref 96–112)
Creat: 1.87 mg/dL — ABNORMAL HIGH (ref 0.50–1.10)
Glucose, Bld: 112 mg/dL — ABNORMAL HIGH (ref 70–99)
POTASSIUM: 4.8 meq/L (ref 3.5–5.3)
Sodium: 139 mEq/L (ref 135–145)

## 2014-10-02 MED ORDER — TRAMADOL HCL 50 MG PO TABS: 50.0000 mg | ORAL_TABLET | Freq: Three times a day (TID) | ORAL | Status: DC | PRN

## 2014-10-02 NOTE — Patient Instructions (Signed)
Get your blood work before you leave   Come back to the office in 2 months   for a routine check up

## 2014-10-02 NOTE — Progress Notes (Signed)
Subjective:    Patient ID: Christina Martin, female    DOB: [birth year], 81 y.o.   MRN: [id number]  DOS:  10/02/2014 Type of visit - description : f/u Interval history: Recently diagnosed with a left leg DVT,  On xarelto, good compliance without apparent side effects DJD, back pain: was switch from Ultram to hydrocodone, does not see any difference, would like to go back on tramadol which allows her to take extra Tylenol now and then   Review of Systems Continue with edema, worse at the end of the day, worse on the left. Mild redness and they pretibial area which is not far from baseline Denies any nausea, vomiting, diarrhea. No dysuria, gross hematuria or blood in the stools.   Past Medical History  Diagnosis Date  . Nonspecific elevation of levels of transaminase or lactic acid dehydrogenase (LDH) 1996    + Hepatitis srology  . GERD (gastroesophageal reflux disease)   . HTN (hypertension) 12/30/2007  . Diastolic dysfunction, left ventricle 05/30/2013    05/2013 see ECHO   . DJD (degenerative joint disease)   . Uterine cancer  (h/O)     no recent f/u   . ATONIC BLADDER-- has a permananet suprapubic catheter    . Prediabetes 04/07/2014  . Calculus in urethra     Dr. Diona Fanti    Past Surgical History  Procedure Laterality Date  . Esophageal dilation  2006  . Total knee arthroplasty  1996 & 2002  . Appendectomy    . Total abdominal hysterectomy w/ bilateral salpingoophorectomy  1990    uterine cancer  . Colonoscopy w/ polypectomy  1998    Tics in 2006; Dr Sharlett Iles  . Sbo  1991    adhesions resected  . Posterior laminectomy / decompression lumbar spine  2009    Dr Lorin Mercy  . Cataract surgery  2014    bilateral; Dr Prudencio Burly    History   Social History  . Marital Status: Widowed    Spouse Name: N/A  . Number of Children: 3  . Years of Education: N/A   Occupational History  . Retired-office work    Social History Main Topics  . Smoking status: Former Smoker    Quit  date: 05/22/1986  . Smokeless tobacco: Not on file     Comment: smoked 1944-1988 , up to 1 ppd  . Alcohol Use: No  . Drug Use: No  . Sexual Activity: Not on file   Other Topics Concern  . Not on file   Social History Narrative   Lives by herself, drives           Medication List       This list is accurate as of: 10/02/14 11:59 PM.  Always use your most recent med list.               acetaminophen 325 MG tablet  Commonly known as:  TYLENOL  Take 650 mg by mouth as needed.     benazepril 40 MG tablet  Commonly known as:  LOTENSIN  TAKE 1 TABLET BY MOUTH DAILY.     CALCIUM 600 + D PO  Take by mouth. 2 by mouth daily     CITRUCEL oral powder  Generic drug:  methylcellulose  Take by mouth daily.     Fish Oil 1200 MG Caps  Take by mouth daily.     furosemide 80 MG tablet  Commonly known as:  LASIX  TAKE 1 TABLET (80 MG TOTAL) BY MOUTH  2 (TWO) TIMES DAILY.     KLOR-CON M20 20 MEQ tablet  Generic drug:  potassium chloride SA  TAKE 1 TABLET BY MOUTH DAILY     lactulose 10 GM/15ML solution  Commonly known as:  CHRONULAC  Take 15 mLs (10 g total) by mouth daily as needed for mild constipation.     multivitamin tablet  Take 1 tablet by mouth daily.     omeprazole 20 MG capsule  Commonly known as:  PRILOSEC  Take 20 mg by mouth. Sun, Tu,Thurs, Sat     rivaroxaban 20 MG Tabs tablet  Commonly known as:  XARELTO  Take 1 tablet (20 mg total) by mouth daily with supper. For 3 months.     STOOL SOFTENER PO  Take by mouth daily.     traMADol 50 MG tablet  Commonly known as:  ULTRAM  Take 1 tablet (50 mg total) by mouth 3 (three) times daily as needed.     vitamin E 400 UNIT capsule  Take 400 Units by mouth daily.           Objective:   Physical Exam BP 128/78 mmHg  Pulse 72  Temp(Src) 98.2 F (36.8 C) (Oral)  Ht 5\' 3"  (1.6 m)  Wt 225 lb 2 oz (102.116 kg)  BMI 39.89 kg/m2  SpO2 98%  General:   Well developed, well nourished . NAD.  HEENT:    Normocephalic . Face symmetric, atraumatic Lungs:  CTA B Normal respiratory effort, no intercostal retractions, no accessory muscle use. Heart: RRR,  no murmur.  no pretibial edema bilaterally  MSK: + Pretibial edema, worse on the left, calf is approximately 1 inch larger on the left. Skin in the pretibial area is a slightly red but no warm or TTP. Pedal pulses present. Has a non- tender cord at left calf Neurologic:  alert & oriented X3.  Speech normal, gait appropriate for age and unassisted Psych--  Cognition and judgment appear intact.  Cooperative with normal attention span and concentration.  Behavior appropriate. No anxious or depressed appearing.      Assessment & Plan:   CHF, check a BMP, has an appointment with cardiology soon.  Check A1c

## 2014-10-02 NOTE — Progress Notes (Signed)
Pre visit review using our clinic review tool, if applicable. No additional management support is needed unless otherwise documented below in the visit note. 

## 2014-10-03 DIAGNOSIS — I82402 Acute embolism and thrombosis of unspecified deep veins of left lower extremity: Secondary | ICD-10-CM | POA: Insufficient documentation

## 2014-10-03 LAB — HEMOGLOBIN A1C
HEMOGLOBIN A1C: 5.8 % — AB (ref <5.7)
MEAN PLASMA GLUCOSE: 120 mg/dL — AB (ref <117)

## 2014-10-03 NOTE — Assessment & Plan Note (Signed)
Back pain, likes to go back on tramadol, Vicodin did not help any better.

## 2014-10-03 NOTE — Assessment & Plan Note (Signed)
DVT diagnosis 08-2014, all Xarelto, good compliance, no apparent complications. DVT happened in the context of the patient in a slightly more sedentary than usual. Plan: Continue xarelto , follow-up 2 months, at the time ultrasound will be ordered and the will decide for how long to stay anticoagulated .

## 2014-10-03 NOTE — Assessment & Plan Note (Signed)
Edema, Multiple questions answered to the best of my ability, time counseling more than 30 minutes. We discussed edema prevention with Ace wrap both legs  early in the morning, rest with legs elevated 1 hour twice a day. If possible, use compression stocking however that may be difficult.

## 2014-10-09 ENCOUNTER — Encounter: Payer: Self-pay | Admitting: Cardiovascular Disease

## 2014-10-09 ENCOUNTER — Other Ambulatory Visit: Payer: Self-pay

## 2014-10-09 ENCOUNTER — Ambulatory Visit (INDEPENDENT_AMBULATORY_CARE_PROVIDER_SITE_OTHER): Payer: Medicare Other | Admitting: Cardiovascular Disease

## 2014-10-09 VITALS — BP 108/70 | HR 87 | Ht 62.5 in | Wt 239.0 lb

## 2014-10-09 DIAGNOSIS — I5032 Chronic diastolic (congestive) heart failure: Secondary | ICD-10-CM | POA: Diagnosis not present

## 2014-10-09 DIAGNOSIS — I25119 Atherosclerotic heart disease of native coronary artery with unspecified angina pectoris: Secondary | ICD-10-CM | POA: Diagnosis not present

## 2014-10-09 NOTE — Patient Instructions (Signed)
Medication Instructions:  Your physician recommends that you continue on your current medications as directed. Please refer to the Current Medication list given to you today.   Labwork: none  Testing/Procedures: none  Follow-Up: Your physician wants you to follow-up in:  12 months.  You will receive a reminder letter in the mail two months in advance. If you don't receive a letter, please call our office to schedule the follow-up appointment.        

## 2014-10-09 NOTE — Progress Notes (Signed)
Chief Complaint  Patient presents with  . Leg Swelling    History of Present Illness: 81 yo female with history of HTN, GERD, neurogenic bladder with suprapubic catheter, DVT now on Xarelto here today for cardiac followup. She was seen as a new patient 06/03/13 for evaluation of lower ext edema. No chest pain. She did note dyspnea with exertion. Echo per Dr. Linna Darner with normal LV systolic function with diastolic dysfunction, mild MR. No prior cardiac conditions. I increased her Lasix at the first visit in hopes that we could manage her with medications alone. I saw her 07/01/13 and she had lost 17 lbs, LE edema improved. She lost over 25 lbs with lasix therapy. Diagnosed with DVT April 2016 and started on Xarelto.   She is here today for follow up. She is feeling well. No chest pain or SOB. LE edema is stable now with some residual swelling in the left leg from DVT. No dizziness, near syncope or near syncope. Weight stable.   Primary Care Physician: Larose Kells   Last Lipid Profile:Lipid Panel     Component Value Date/Time   CHOL 183 07/21/2014 1413   TRIG 65.0 07/21/2014 1413   HDL 65.00 07/21/2014 1413   CHOLHDL 3 07/21/2014 1413   VLDL 13.0 07/21/2014 1413   LDLCALC 105* 07/21/2014 1413     Past Medical History  Diagnosis Date  . Nonspecific elevation of levels of transaminase or lactic acid dehydrogenase (LDH) 1996    + Hepatitis srology  . GERD (gastroesophageal reflux disease)   . HTN (hypertension) 12/30/2007  . Diastolic dysfunction, left ventricle 05/30/2013    05/2013 see ECHO   . DJD (degenerative joint disease)   . Uterine cancer  (h/O)     no recent f/u   . ATONIC BLADDER-- has a permananet suprapubic catheter    . Prediabetes 04/07/2014  . Calculus in urethra     Dr. Diona Fanti    Past Surgical History  Procedure Laterality Date  . Esophageal dilation  2006  . Total knee arthroplasty  1996 & 2002  . Appendectomy    . Total abdominal hysterectomy w/ bilateral  salpingoophorectomy  1990    uterine cancer  . Colonoscopy w/ polypectomy  1998    Tics in 2006; Dr Sharlett Iles  . Sbo  1991    adhesions resected  . Posterior laminectomy / decompression lumbar spine  2009    Dr Lorin Mercy  . Cataract surgery  2014    bilateral; Dr Prudencio Burly    Current Outpatient Prescriptions  Medication Sig Dispense Refill  . acetaminophen (TYLENOL) 325 MG tablet Take 650 mg by mouth every 6 (six) hours as needed (pain).     . benazepril (LOTENSIN) 40 MG tablet TAKE 1 TABLET BY MOUTH DAILY. 90 tablet 1  . Calcium Carbonate-Vitamin D (CALCIUM 600 + D PO) Take 2 capsules by mouth daily. \    . Docusate Calcium (STOOL SOFTENER PO) Take 1 capsule by mouth daily.     . furosemide (LASIX) 80 MG tablet TAKE 1 TABLET (80 MG TOTAL) BY MOUTH 2 (TWO) TIMES DAILY. 180 tablet 1  . KLOR-CON M20 20 MEQ tablet TAKE 1 TABLET BY MOUTH DAILY 90 tablet 1  . lactulose (CHRONULAC) 10 GM/15ML solution Take 15 mLs (10 g total) by mouth daily as needed for mild constipation. 472 mL 0  . Methylcellulose, Laxative, (CITRUCEL PO) Take 2 tablets by mouth daily.    . Multiple Vitamin (MULTIVITAMIN) tablet Take 1 tablet by mouth daily.      Marland Kitchen  Omega-3 Fatty Acids (FISH OIL) 1200 MG CAPS Take 1 capsule by mouth daily.     Marland Kitchen omeprazole (PRILOSEC) 20 MG capsule Take 20 mg by mouth as directed. Take only on Sun, Tues,Thurs, Sat    . rivaroxaban (XARELTO) 20 MG TABS tablet Take 1 tablet (20 mg total) by mouth daily with supper. For 3 months. 30 tablet 2  . traMADol (ULTRAM) 50 MG tablet Take 1 tablet (50 mg total) by mouth 3 (three) times daily as needed. (Patient taking differently: Take 50 mg by mouth 3 (three) times daily as needed (pain). ) 90 tablet 1  . vitamin E 400 UNIT capsule Take 400 Units by mouth daily.       No current facility-administered medications for this visit.    Allergies  Allergen Reactions  . Lithostat [Acetohydroxamic Acid] Other (See Comments)    Unusual tiredness, weakness,  nervousness, mental confusion, sore throat, Poor appetite    History   Social History  . Marital Status: Widowed    Spouse Name: N/A  . Number of Children: 3  . Years of Education: N/A   Occupational History  . Retired-office work    Social History Main Topics  . Smoking status: Former Smoker    Quit date: 05/22/1986  . Smokeless tobacco: Not on file     Comment: smoked 1944-1988 , up to 1 ppd  . Alcohol Use: No  . Drug Use: No  . Sexual Activity: Not on file   Other Topics Concern  . Not on file   Social History Narrative   Lives by herself, drives       Family History  Problem Relation Age of Onset  . Liver disease Sister     non hepatitis  . Colon cancer Mother   . Diabetes Mother     mother, brother  . Stroke Mother     > 73  . Heart failure Sister   . CAD Brother     brother, mother , sister  . Breast cancer Neg Hx     Review of Systems:  As stated in the HPI and otherwise negative.   BP 108/70 mmHg  Pulse 87  Ht 5' 2.5" (1.588 m)  Wt 239 lb (108.41 kg)  BMI 42.99 kg/m2  Physical Examination: General: Well developed, well nourished, NAD HEENT: OP clear, mucus membranes moist SKIN: warm, dry. No rashes. Neuro: No focal deficits Musculoskeletal: Muscle strength 5/5 all ext Psychiatric: Mood and affect normal Neck: No JVD, no carotid bruits, no thyromegaly, no lymphadenopathy. Lungs:Clear bilaterally, no wheezes, rhonci, crackles Cardiovascular: Regular rate and rhythm. No murmurs, gallops or rubs. Abdomen:Soft. Bowel sounds present. Non-tender.  Extremities: 1+ bilateral lower extremity edema with chronic venous stasis changes. Pulses are non-palpable.   Echo 05/30/13:  Left ventricle: The cavity size was normal. There was mild focal basal and mild concentric hypertrophy of the septum. Systolic function was normal. The estimated ejection fraction was in the range of 60% to 65%. Wall motion was normal; there were no regional wall motion  abnormalities. Doppler parameters are consistent with abnormal left ventricular relaxation (grade 1 diastolic dysfunction). The E/e' ratio is >15, suggesting markedly elevated LV filling pressure. - Mitral valve: Posterior calcified annulus. Mild regurgitation. - Left atrium: The atrium was at the upper limits of normal in size. - Right ventricle: The cavity size was normal. Wall thickness was normal. The moderator band was prominent. Systolic function was normal. RV systolic pressure: 96EX Hg (S, est). - Right atrium: The  atrium was mildly dilated (18.5 cm2). - Atrial septum: Bowing of the IAS from left to right suggests high LA pressure. - Tricuspid valve: Dilated annulus at 4.07 cm. Moderate regurgitation. - Pulmonic valve: Trivial regurgitation. - Inferior vena cava: The vessel was normal in size; the respirophasic diameter changes were in the normal range (= 50%); findings are consistent with normal central venous pressure. - Pericardium, extracardiac: There was no pericardial Effusion.  EKG:  EKG is not ordered today. The ekg ordered today demonstrates   Recent Labs: 04/07/2014: Hemoglobin 14.1; Platelets 246.0 10/02/2014: BUN 28*; Creatinine 1.87*; Potassium 4.8; Sodium 139   Lipid Panel    Component Value Date/Time   CHOL 183 07/21/2014 1413   TRIG 65.0 07/21/2014 1413   HDL 65.00 07/21/2014 1413   CHOLHDL 3 07/21/2014 1413   VLDL 13.0 07/21/2014 1413   LDLCALC 105* 07/21/2014 1413   LDLDIRECT 116.3 02/13/2011 1158     Wt Readings from Last 3 Encounters:  10/09/14 239 lb (108.41 kg)  10/02/14 225 lb 2 oz (102.116 kg)  09/01/14 234 lb (106.142 kg)     Other studies Reviewed: Additional studies/ records that were reviewed today include: . Review of the above records demonstrates:    Assessment and Plan:   1. Chronic diastolic CHF: Lower ext edema is much improved. Her echo shows normal LV systolic function but she does have LVH and evidence of diastolic  dysfunction. Her lower extremity edema is likely secondary to her diastolic dysfunction and worsened on the left recently from DVT. Doing well on Lasix. Continue KDur 65meq po daily.  She will elevate legs, limit salt intake. She cannot tolerate compression stockings.   Current medicines are reviewed at length with the patient today.  The patient does not have concerns regarding medicines.  The following changes have been made:  no change  Labs/ tests ordered today include:  No orders of the defined types were placed in this encounter.    Disposition:   FU with me in 12  months  Signed, Lauree Chandler, MD 10/09/2014 2:43 PM    [address], [place], [place]  [address] Phone: [phone]; Fax: [phone]

## 2014-10-27 DIAGNOSIS — N319 Neuromuscular dysfunction of bladder, unspecified: Secondary | ICD-10-CM | POA: Diagnosis not present

## 2014-12-04 DIAGNOSIS — N319 Neuromuscular dysfunction of bladder, unspecified: Secondary | ICD-10-CM | POA: Diagnosis not present

## 2014-12-04 DIAGNOSIS — N210 Calculus in bladder: Secondary | ICD-10-CM | POA: Diagnosis not present

## 2014-12-07 ENCOUNTER — Ambulatory Visit (INDEPENDENT_AMBULATORY_CARE_PROVIDER_SITE_OTHER): Payer: Medicare Other | Admitting: Internal Medicine

## 2014-12-07 ENCOUNTER — Encounter: Payer: Self-pay | Admitting: Internal Medicine

## 2014-12-07 VITALS — BP 128/76 | HR 76 | Temp 98.8°F | Resp 20 | Ht 63.0 in | Wt 235.2 lb

## 2014-12-07 DIAGNOSIS — I10 Essential (primary) hypertension: Secondary | ICD-10-CM | POA: Diagnosis not present

## 2014-12-07 DIAGNOSIS — I82402 Acute embolism and thrombosis of unspecified deep veins of left lower extremity: Secondary | ICD-10-CM | POA: Diagnosis not present

## 2014-12-07 DIAGNOSIS — I25119 Atherosclerotic heart disease of native coronary artery with unspecified angina pectoris: Secondary | ICD-10-CM | POA: Diagnosis not present

## 2014-12-07 DIAGNOSIS — K219 Gastro-esophageal reflux disease without esophagitis: Secondary | ICD-10-CM

## 2014-12-07 LAB — BASIC METABOLIC PANEL
BUN: 28 mg/dL — ABNORMAL HIGH (ref 6–23)
CO2: 31 meq/L (ref 19–32)
CREATININE: 1.42 mg/dL — AB (ref 0.40–1.20)
Calcium: 9.8 mg/dL (ref 8.4–10.5)
Chloride: 99 mEq/L (ref 96–112)
GFR: 37.08 mL/min — AB (ref >60.00)
Glucose, Bld: 106 mg/dL — ABNORMAL HIGH (ref 70–99)
Potassium: 4.1 mEq/L (ref 3.5–5.1)
SODIUM: 138 meq/L (ref 135–145)

## 2014-12-07 NOTE — Patient Instructions (Signed)
Get your blood work before you leave   will schedule a ultrasound

## 2014-12-07 NOTE — Progress Notes (Signed)
Pre visit review using our clinic review tool, if applicable. No additional management support is needed unless otherwise documented below in the visit note. 

## 2014-12-07 NOTE — Progress Notes (Signed)
Subjective:    Patient ID: Christina Martin, female    DOB: [birth year], 81 y.o.   MRN: [id number]  DOS:  12/07/2014 Type of visit - description : Follow-up Interval history:  DVT, on Xarelto, good compliance, no apparent side effects. History of GERD and esophageal stenosis, having very mild difficulty swallowing in the last few months Hypertension, last creatinine elevated, due for a repeated BMP  Review of Systems Denies chest pain or difficulty breathing. No blood in the urine or stools. Denies lower extremity pain but she has persistent left lower extremity edema, worse at the end of the day but swelling never clears.   Past Medical History  Diagnosis Date  . Nonspecific elevation of levels of transaminase or lactic acid dehydrogenase (LDH) 1996    + Hepatitis srology  . GERD (gastroesophageal reflux disease)   . HTN (hypertension) 12/30/2007  . Diastolic dysfunction, left ventricle 05/30/2013    05/2013 see ECHO   . DJD (degenerative joint disease)   . Uterine cancer  (h/O)     no recent f/u   . ATONIC BLADDER-- has a permananet suprapubic catheter    . Prediabetes 04/07/2014  . Calculus in urethra     Dr. Diona Fanti    Past Surgical History  Procedure Laterality Date  . Esophageal dilation  2006  . Total knee arthroplasty  1996 & 2002  . Appendectomy    . Total abdominal hysterectomy w/ bilateral salpingoophorectomy  1990    uterine cancer  . Colonoscopy w/ polypectomy  1998    Tics in 2006; Dr Sharlett Iles  . Sbo  1991    adhesions resected  . Posterior laminectomy / decompression lumbar spine  2009    Dr Lorin Mercy  . Cataract surgery  2014    bilateral; Dr Prudencio Burly    History   Social History  . Marital Status: Widowed    Spouse Name: N/A  . Number of Children: 3  . Years of Education: N/A   Occupational History  . Retired-office work    Social History Main Topics  . Smoking status: Former Smoker    Quit date: 05/22/1986  . Smokeless tobacco: Not on file   Comment: smoked 1944-1988 , up to 1 ppd  . Alcohol Use: No  . Drug Use: No  . Sexual Activity: Not on file   Other Topics Concern  . Not on file   Social History Narrative   Lives by herself, drives           Medication List       This list is accurate as of: 12/07/14 11:59 PM.  Always use your most recent med list.               acetaminophen 325 MG tablet  Commonly known as:  TYLENOL  Take 650 mg by mouth every 6 (six) hours as needed (pain).     benazepril 40 MG tablet  Commonly known as:  LOTENSIN  TAKE 1 TABLET BY MOUTH DAILY.     CALCIUM 600 + D PO  Take 2 capsules by mouth daily. \     CITRUCEL PO  Take 2 tablets by mouth daily.     Fish Oil 1200 MG Caps  Take 1 capsule by mouth daily.     furosemide 80 MG tablet  Commonly known as:  LASIX  TAKE 1 TABLET (80 MG TOTAL) BY MOUTH 2 (TWO) TIMES DAILY.     KLOR-CON M20 20 MEQ tablet  Generic drug:  potassium chloride SA  TAKE 1 TABLET BY MOUTH DAILY     lactulose 10 GM/15ML solution  Commonly known as:  CHRONULAC  Take 15 mLs (10 g total) by mouth daily as needed for mild constipation.     multivitamin tablet  Take 1 tablet by mouth daily.     omeprazole 20 MG capsule  Commonly known as:  PRILOSEC  Take 20 mg by mouth as directed. Take only on Sun, Tues,Thurs, Sat     rivaroxaban 20 MG Tabs tablet  Commonly known as:  XARELTO  Take 1 tablet (20 mg total) by mouth daily with supper. For 3 months.     STOOL SOFTENER PO  Take 1 capsule by mouth daily.     traMADol 50 MG tablet  Commonly known as:  ULTRAM  Take 1 tablet (50 mg total) by mouth 3 (three) times daily as needed.     vitamin E 400 UNIT capsule  Take 400 Units by mouth daily.           Objective:   Physical Exam BP 128/76 mmHg  Pulse 76  Temp(Src) 98.8 F (37.1 C) (Oral)  Resp 20  Ht 5\' 3"  (1.6 m)  Wt 235 lb 4 oz (106.709 kg)  BMI 41.68 kg/m2     General:   Well developed, well nourished . NAD.  HEENT:  Normocephalic  . Face symmetric, atraumatic Lungs:  CTA B Normal respiratory effort, no intercostal retractions, no accessory muscle use. Heart: RRR,  no murmur.  No pretibial edema bilaterally  Extremities: Left calf is 48 cm in circumference. No TTP. Trace pitting edema bilaterally  Skin: Not pale. Not jaundice Neurologic:  alert & oriented X3.  Speech normal, gait appropriate for age and unassisted Psych--  Cognition and judgment appear intact.  Cooperative with normal attention span and concentration.  Behavior appropriate. No anxious or depressed appearing.    Assessment & Plan:

## 2014-12-08 NOTE — Assessment & Plan Note (Signed)
Hypertension, well-controlled, last creatinine slightly elevated, check a BMP

## 2014-12-08 NOTE — Assessment & Plan Note (Signed)
History of dysphagia, status post esophageal stretching, symptoms are returning to some extent. Fortunately they are mild so far. Plan is to increase PPI from as needed to every day. She knows to call me if symptoms increase

## 2014-12-08 NOTE — Assessment & Plan Note (Signed)
Recent DVT in the context of being slightly more sedentary than usual, on xarelto, good compliance and tolerance. Today she reports that several family members have taking Coumadin on and off, she does not why. Plan: Ultrasound, hematology referral to see about duration of treatment.

## 2014-12-13 ENCOUNTER — Other Ambulatory Visit: Payer: Self-pay | Admitting: Internal Medicine

## 2014-12-14 ENCOUNTER — Other Ambulatory Visit: Payer: Self-pay | Admitting: Internal Medicine

## 2014-12-15 ENCOUNTER — Ambulatory Visit: Payer: PRIVATE HEALTH INSURANCE | Admitting: Internal Medicine

## 2014-12-16 ENCOUNTER — Ambulatory Visit (HOSPITAL_BASED_OUTPATIENT_CLINIC_OR_DEPARTMENT_OTHER)
Admission: RE | Admit: 2014-12-16 | Discharge: 2014-12-16 | Disposition: A | Discharge status: Home / Self Care | Payer: Medicare Other | Source: Ambulatory Visit | Attending: Internal Medicine | Admitting: Internal Medicine

## 2014-12-16 DIAGNOSIS — I82412 Acute embolism and thrombosis of left femoral vein: Secondary | ICD-10-CM | POA: Diagnosis not present

## 2014-12-16 DIAGNOSIS — I82432 Acute embolism and thrombosis of left popliteal vein: Secondary | ICD-10-CM | POA: Diagnosis not present

## 2014-12-16 DIAGNOSIS — R609 Edema, unspecified: Secondary | ICD-10-CM | POA: Diagnosis present

## 2014-12-16 DIAGNOSIS — H26491 Other secondary cataract, right eye: Secondary | ICD-10-CM | POA: Diagnosis not present

## 2014-12-16 DIAGNOSIS — I82402 Acute embolism and thrombosis of unspecified deep veins of left lower extremity: Secondary | ICD-10-CM

## 2014-12-16 DIAGNOSIS — Z961 Presence of intraocular lens: Secondary | ICD-10-CM | POA: Diagnosis not present

## 2014-12-17 ENCOUNTER — Telehealth: Payer: Self-pay | Admitting: Internal Medicine

## 2014-12-17 NOTE — Telephone Encounter (Signed)
Caller name: Pearlie Oyster Relation to pt: daughter  Call back number:  [phone]   Reason for call:  Returning calling inquiring about lab results . Thank you

## 2014-12-18 NOTE — Telephone Encounter (Signed)
Caller name: Pearlie Oyster  Relation to pt: daughter  Call back number: [phone]   Returning call for [place] results

## 2014-12-18 NOTE — Telephone Encounter (Signed)
refwding to [facility]!

## 2014-12-31 ENCOUNTER — Other Ambulatory Visit: Payer: Self-pay | Admitting: Internal Medicine

## 2014-12-31 NOTE — Telephone Encounter (Signed)
Pt is requesting refill on Tramadol.   Last OV: 12/07/2014 Last Fill: 10/02/2014 #90 1RF UDS: 07/21/2014 Low risk   Please advise.

## 2015-01-01 NOTE — Telephone Encounter (Signed)
Rx faxed to [facility] pharmacy.  

## 2015-01-01 NOTE — Telephone Encounter (Signed)
Okay 90 and one refill

## 2015-01-01 NOTE — Telephone Encounter (Signed)
Rx printed, awaiting MD signature.  

## 2015-01-06 DIAGNOSIS — N319 Neuromuscular dysfunction of bladder, unspecified: Secondary | ICD-10-CM | POA: Diagnosis not present

## 2015-01-19 ENCOUNTER — Other Ambulatory Visit (HOSPITAL_BASED_OUTPATIENT_CLINIC_OR_DEPARTMENT_OTHER): Payer: Medicare Other

## 2015-01-19 ENCOUNTER — Ambulatory Visit (HOSPITAL_BASED_OUTPATIENT_CLINIC_OR_DEPARTMENT_OTHER): Payer: Medicare Other | Admitting: Hematology & Oncology

## 2015-01-19 ENCOUNTER — Ambulatory Visit: Payer: Medicare Other

## 2015-01-19 ENCOUNTER — Ambulatory Visit (HOSPITAL_BASED_OUTPATIENT_CLINIC_OR_DEPARTMENT_OTHER)
Admission: RE | Admit: 2015-01-19 | Discharge: 2015-01-19 | Disposition: A | Discharge status: Home / Self Care | Payer: Medicare Other | Source: Ambulatory Visit | Attending: Hematology & Oncology | Admitting: Hematology & Oncology

## 2015-01-19 ENCOUNTER — Encounter: Payer: Self-pay | Admitting: Hematology & Oncology

## 2015-01-19 VITALS — BP 139/55 | HR 89 | Temp 97.2°F | Resp 18 | Ht 63.0 in | Wt 246.0 lb

## 2015-01-19 DIAGNOSIS — I25119 Atherosclerotic heart disease of native coronary artery with unspecified angina pectoris: Secondary | ICD-10-CM

## 2015-01-19 DIAGNOSIS — I517 Cardiomegaly: Secondary | ICD-10-CM | POA: Diagnosis not present

## 2015-01-19 DIAGNOSIS — I82402 Acute embolism and thrombosis of unspecified deep veins of left lower extremity: Secondary | ICD-10-CM | POA: Diagnosis not present

## 2015-01-19 DIAGNOSIS — R05 Cough: Secondary | ICD-10-CM | POA: Insufficient documentation

## 2015-01-19 DIAGNOSIS — R0602 Shortness of breath: Secondary | ICD-10-CM | POA: Insufficient documentation

## 2015-01-19 DIAGNOSIS — R059 Cough, unspecified: Secondary | ICD-10-CM

## 2015-01-19 DIAGNOSIS — J449 Chronic obstructive pulmonary disease, unspecified: Secondary | ICD-10-CM | POA: Diagnosis not present

## 2015-01-19 DIAGNOSIS — I82409 Acute embolism and thrombosis of unspecified deep veins of unspecified lower extremity: Secondary | ICD-10-CM | POA: Diagnosis not present

## 2015-01-19 LAB — BASIC METABOLIC PANEL
BUN: 26 mg/dL — AB (ref 7–25)
CHLORIDE: 99 mmol/L (ref 98–110)
CO2: 29 mmol/L (ref 20–31)
Calcium: 9.2 mg/dL (ref 8.6–10.4)
Creatinine, Ser: 1.43 mg/dL — ABNORMAL HIGH (ref 0.60–0.88)
Glucose, Bld: 110 mg/dL — ABNORMAL HIGH (ref 65–99)
Potassium: 4.3 mmol/L (ref 3.5–5.3)
Sodium: 140 mmol/L (ref 135–146)

## 2015-01-19 LAB — CBC WITH DIFFERENTIAL (CANCER CENTER ONLY)
BASO#: 0.1 10*3/uL (ref 0.0–0.2)
BASO%: 0.9 % (ref 0.0–2.0)
EOS ABS: 0.4 10*3/uL (ref 0.0–0.5)
EOS%: 5.3 % (ref 0.0–7.0)
HCT: 35.3 % (ref 34.8–46.6)
HGB: 11.8 g/dL (ref 11.6–15.9)
LYMPH#: 2.3 10*3/uL (ref 0.9–3.3)
LYMPH%: 28.7 % (ref 14.0–48.0)
MCH: 30.3 pg (ref 26.0–34.0)
MCHC: 33.4 g/dL (ref 32.0–36.0)
MCV: 91 fL (ref 81–101)
MONO#: 1.1 10*3/uL — AB (ref 0.1–0.9)
MONO%: 14.4 % — ABNORMAL HIGH (ref 0.0–13.0)
NEUT#: 4 10*3/uL (ref 1.5–6.5)
NEUT%: 50.7 % (ref 39.6–80.0)
Platelets: 243 10*3/uL (ref 145–400)
RBC: 3.9 10*6/uL (ref 3.70–5.32)
RDW: 13.8 % (ref 11.1–15.7)
WBC: 7.9 10*3/uL (ref 3.9–10.0)

## 2015-01-19 NOTE — Progress Notes (Signed)
Referral MD  Reason for Referral: Thrombus in the left leg - idiopathic   Chief Complaint  Patient presents with  . OTHER    New Patient  : I have a blood clot in my left leg.  HPI: Christina Martin is a very nice 81 year old white female. She has past history significant for uterine cancer. She's had a hysterectomy. This was probably about 20 years ago.  Back in April, she began have some swelling in the left leg. She has a was has some lower extremity swelling. She is seeing cardiology for this. She's been put on a diuretic.  She saw her family doctor, Dr.Paz, and he one-handed Doppler. Surprising of, the Doppler showed that she had an extensive thrombus in the left leg. This extended from the femoral vein down to the popliteal vein. This was an occlusive thrombus.  She had no recent surgery. She had no recent travel. She was not inactive. She is somewhat large. However, there is no change in weight loss or weight gain. Per she does have a past history of tobacco use. She's not smoked probably for about 25 years. She does not recall her last chest x-ray was done.  She's had a little bit of a cough. There has been no hemoptysis.  She's recently had a repeat Doppler of the left leg. This showed continued thrombus.  She was placed on Xarelto. She has had no problem with the Xarelto. There's been no bleeding. There's been no change in bowel or bladder habits. She is incontinent of urine. This is because of a spinal surgery procedure.  She still has some swelling in the left leg. Is not painful. It is not warm. There is some slight stasis dermatitis changes noted.  Overall, her performance status is ECOG 2.                 Past Medical History  Diagnosis Date  . Nonspecific elevation of levels of transaminase or lactic acid dehydrogenase (LDH) 1996    + Hepatitis srology  . GERD (gastroesophageal reflux disease)   . HTN (hypertension) 12/30/2007  . Diastolic dysfunction, left  ventricle 05/30/2013    05/2013 see ECHO   . DJD (degenerative joint disease)   . Uterine cancer  (h/O)     no recent f/u   . ATONIC BLADDER-- has a permananet suprapubic catheter    . Prediabetes 04/07/2014  . Calculus in urethra     Dr. Diona Fanti  :  Past Surgical History  Procedure Laterality Date  . Esophageal dilation  2006  . Total knee arthroplasty  1996 & 2002  . Appendectomy    . Total abdominal hysterectomy w/ bilateral salpingoophorectomy  1990    uterine cancer  . Colonoscopy w/ polypectomy  1998    Tics in 2006; Dr Sharlett Iles  . Sbo  1991    adhesions resected  . Posterior laminectomy / decompression lumbar spine  2009    Dr Lorin Mercy  . Cataract surgery  2014    bilateral; Dr Prudencio Burly  :   Current outpatient prescriptions:  .  acetaminophen (TYLENOL) 325 MG tablet, Take 650 mg by mouth every 6 (six) hours as needed (pain). , Disp: , Rfl:  .  benazepril (LOTENSIN) 40 MG tablet, Take 1 tablet (40 mg total) by mouth daily., Disp: 90 tablet, Rfl: 1 .  Calcium Carbonate-Vitamin D (CALCIUM 600 + D PO), Take 2 capsules by mouth daily. \, Disp: , Rfl:  .  Docusate Calcium (STOOL SOFTENER PO),  Take 1 capsule by mouth daily. , Disp: , Rfl:  .  furosemide (LASIX) 80 MG tablet, TAKE 1 TABLET (80 MG TOTAL) BY MOUTH 2 (TWO) TIMES DAILY., Disp: 180 tablet, Rfl: 1 .  KLOR-CON M20 20 MEQ tablet, TAKE 1 TABLET BY MOUTH DAILY, Disp: 90 tablet, Rfl: 1 .  lactulose (CHRONULAC) 10 GM/15ML solution, Take 15 mLs (10 g total) by mouth daily as needed for mild constipation., Disp: 472 mL, Rfl: 0 .  Methylcellulose, Laxative, (CITRUCEL PO), Take 2 tablets by mouth daily., Disp: , Rfl:  .  Multiple Vitamin (MULTIVITAMIN) tablet, Take 1 tablet by mouth daily.  , Disp: , Rfl:  .  Omega-3 Fatty Acids (FISH OIL) 1200 MG CAPS, Take 1 capsule by mouth daily. , Disp: , Rfl:  .  omeprazole (PRILOSEC) 20 MG capsule, Take 20 mg by mouth as directed. Take only on Sun, Tues,Thurs, Sat, Disp: , Rfl:  .   rivaroxaban (XARELTO) 20 MG TABS tablet, Take 1 tablet (20 mg total) by mouth daily with supper., Disp: 30 tablet, Rfl: 2 .  traMADol (ULTRAM) 50 MG tablet, Take 1 tablet (50 mg total) by mouth 3 (three) times daily as needed., Disp: 90 tablet, Rfl: 1 .  vitamin E 400 UNIT capsule, Take 400 Units by mouth daily.  , Disp: , Rfl: :  :  Allergies  Allergen Reactions  . Lithostat [Acetohydroxamic Acid] Other (See Comments)    Unusual tiredness, weakness, nervousness, mental confusion, sore throat, Poor appetite  :  Family History  Problem Relation Age of Onset  . Liver disease Sister     non hepatitis  . Colon cancer Mother   . Diabetes Mother     mother, brother  . Stroke Mother     > 40  . Heart failure Sister   . CAD Brother     brother, mother , sister  . Breast cancer Neg Hx   :  Social History   Social History  . Marital Status: Widowed    Spouse Name: N/A  . Number of Children: 3  . Years of Education: N/A   Occupational History  . Retired-office work    Social History Main Topics  . Smoking status: Former Smoker    Quit date: 05/22/1986  . Smokeless tobacco: Not on file     Comment: smoked 1944-1988 , up to 1 ppd  . Alcohol Use: No  . Drug Use: No  . Sexual Activity: Not on file   Other Topics Concern  . Not on file   Social History Narrative   Lives by herself, drives     :  Pertinent items are noted in HPI.  Exam: @IPVITALS @  obese white female in no obvious distress. Vital signs are temperature of 97.2. Pulse 89. Blood pressure 139/55. Weight is 246 pounds. Head and neck exam shows no ocular or oral lesions. There are no palpable cervical or supraclavicular lymph nodes. Lungs are clear. Cardiac exam regular rate and rhythm with no murmurs, rubs or bruits. Abdomen is soft. She has good bowel sounds. Abdomen is obese. There is no fluid wave. There is no palpable liver or spleen tip. Back exam shows no tenderness over the spine, ribs or hips. Extremities  shows moderate nonpitting edema in the left leg. She has some stasis dermatitis changes in the left leg. Right leg has some chronic mild nonpitting edema. Skin exam shows no rashes, ecchymoses or petechia. Again, she has the dermatitis type changes in the lower extremities. Neurological  exam shows no focal neurological deficits.    Recent Labs  01/19/15 1021  WBC 7.9  HGB 11.8  HCT 35.3  PLT 243   No results for input(s): NA, K, CL, CO2, GLUCOSE, BUN, CREATININE, CALCIUM in the last 72 hours.  Blood smear review:  None  Pathology: None     Assessment and Plan:  Christina Martin is a very nice 36-year-old white female. She has a thrombus in the left leg.  I do believe that this is idiopathic. At her age, I don't believe there is any role for doing a thrombophilic panel. I think this would be highly, highly likely to be positive.  I'm not sure as to why she would have this thrombus at 81 years old. She used to smoke. I think it would be reasonable to get a chest x-ray and make sure that everything is okay with her lungs.  She has been on Xarelto now for about 4 months or so. I pry would keep her on Xarelto for a year. She has a fairly extensive thrombus. I would think that she would be at increased risk for a pulmonary embolism if she got off anticoagulation.  I think she will always have swelling in the left leg. The problem is that she does not have the body habitus to be able to put on and take off a compression stocking.  I spent a good 45 minutes with she and her daughter.  I will like to see her back in about 3 months. I will get a Doppler the last see her back.  Pete E.

## 2015-01-20 ENCOUNTER — Telehealth: Payer: Self-pay | Admitting: *Deleted

## 2015-01-20 NOTE — Telephone Encounter (Signed)
-----   Message from Volanda Napoleon, MD sent at 01/20/2015  6:41 AM EDT ----- Call her dgtr - CXR is ok!!  pete

## 2015-02-02 DIAGNOSIS — N319 Neuromuscular dysfunction of bladder, unspecified: Secondary | ICD-10-CM | POA: Diagnosis not present

## 2015-02-13 ENCOUNTER — Other Ambulatory Visit: Payer: Self-pay | Admitting: Cardiovascular Disease

## 2015-03-03 DIAGNOSIS — N319 Neuromuscular dysfunction of bladder, unspecified: Secondary | ICD-10-CM | POA: Diagnosis not present

## 2015-03-16 ENCOUNTER — Other Ambulatory Visit: Payer: Self-pay | Admitting: Internal Medicine

## 2015-03-16 NOTE — Telephone Encounter (Signed)
Ok #90, no refills

## 2015-03-16 NOTE — Telephone Encounter (Signed)
Rx faxed to [facility] pharmacy.  

## 2015-03-16 NOTE — Telephone Encounter (Signed)
Pt is requesting refill on Tramadol.  Last OV: 12/07/2014, next appt on 04/12/2015 at 1:30 PM Last Fill: 01/01/2015 #90 and 1RF UDS: 07/21/2014 Low risk  Needs contract at next OV.  Please advise.

## 2015-03-16 NOTE — Telephone Encounter (Signed)
Rx printed, awaiting MD signature.  

## 2015-03-16 NOTE — Telephone Encounter (Signed)
Awaiting MD signature 

## 2015-03-18 ENCOUNTER — Other Ambulatory Visit: Payer: Self-pay | Admitting: Internal Medicine

## 2015-03-26 ENCOUNTER — Encounter: Payer: Self-pay | Admitting: Internal Medicine

## 2015-03-26 ENCOUNTER — Inpatient Hospital Stay (HOSPITAL_COMMUNITY)
Admission: EM | Admit: 2015-03-26 | Discharge: 2015-03-29 | DRG: 606 | Disposition: A | Discharge status: Home Health | Payer: Medicare Other | Attending: Internal Medicine | Admitting: Internal Medicine

## 2015-03-26 ENCOUNTER — Emergency Department (HOSPITAL_COMMUNITY): Payer: Medicare Other

## 2015-03-26 ENCOUNTER — Ambulatory Visit (INDEPENDENT_AMBULATORY_CARE_PROVIDER_SITE_OTHER): Payer: Medicare Other | Admitting: Internal Medicine

## 2015-03-26 ENCOUNTER — Encounter (HOSPITAL_COMMUNITY): Payer: Self-pay | Admitting: *Deleted

## 2015-03-26 VITALS — BP 128/76 | HR 84 | Temp 98.2°F | Resp 16 | Ht 63.0 in | Wt 262.1 lb

## 2015-03-26 DIAGNOSIS — Z823 Family history of stroke: Secondary | ICD-10-CM | POA: Diagnosis not present

## 2015-03-26 DIAGNOSIS — N312 Flaccid neuropathic bladder, not elsewhere classified: Secondary | ICD-10-CM | POA: Diagnosis present

## 2015-03-26 DIAGNOSIS — L03119 Cellulitis of unspecified part of limb: Secondary | ICD-10-CM | POA: Diagnosis not present

## 2015-03-26 DIAGNOSIS — N183 Chronic kidney disease, stage 3 (moderate): Secondary | ICD-10-CM | POA: Diagnosis present

## 2015-03-26 DIAGNOSIS — K5900 Constipation, unspecified: Secondary | ICD-10-CM | POA: Diagnosis present

## 2015-03-26 DIAGNOSIS — I739 Peripheral vascular disease, unspecified: Secondary | ICD-10-CM | POA: Diagnosis present

## 2015-03-26 DIAGNOSIS — I130 Hypertensive heart and chronic kidney disease with heart failure and stage 1 through stage 4 chronic kidney disease, or unspecified chronic kidney disease: Secondary | ICD-10-CM | POA: Diagnosis present

## 2015-03-26 DIAGNOSIS — R609 Edema, unspecified: Secondary | ICD-10-CM | POA: Diagnosis not present

## 2015-03-26 DIAGNOSIS — L03116 Cellulitis of left lower limb: Secondary | ICD-10-CM | POA: Diagnosis not present

## 2015-03-26 DIAGNOSIS — L03115 Cellulitis of right lower limb: Secondary | ICD-10-CM | POA: Diagnosis not present

## 2015-03-26 DIAGNOSIS — Z6841 Body Mass Index (BMI) 40.0 and over, adult: Secondary | ICD-10-CM | POA: Diagnosis not present

## 2015-03-26 DIAGNOSIS — Z90710 Acquired absence of both cervix and uterus: Secondary | ICD-10-CM

## 2015-03-26 DIAGNOSIS — I5033 Acute on chronic diastolic (congestive) heart failure: Secondary | ICD-10-CM | POA: Diagnosis present

## 2015-03-26 DIAGNOSIS — N179 Acute kidney failure, unspecified: Secondary | ICD-10-CM | POA: Diagnosis not present

## 2015-03-26 DIAGNOSIS — R600 Localized edema: Secondary | ICD-10-CM

## 2015-03-26 DIAGNOSIS — I519 Heart disease, unspecified: Secondary | ICD-10-CM | POA: Diagnosis not present

## 2015-03-26 DIAGNOSIS — N189 Chronic kidney disease, unspecified: Secondary | ICD-10-CM

## 2015-03-26 DIAGNOSIS — I25119 Atherosclerotic heart disease of native coronary artery with unspecified angina pectoris: Secondary | ICD-10-CM

## 2015-03-26 DIAGNOSIS — Z8542 Personal history of malignant neoplasm of other parts of uterus: Secondary | ICD-10-CM

## 2015-03-26 DIAGNOSIS — K219 Gastro-esophageal reflux disease without esophagitis: Secondary | ICD-10-CM | POA: Diagnosis present

## 2015-03-26 DIAGNOSIS — Z87891 Personal history of nicotine dependence: Secondary | ICD-10-CM

## 2015-03-26 DIAGNOSIS — D649 Anemia, unspecified: Secondary | ICD-10-CM | POA: Diagnosis present

## 2015-03-26 DIAGNOSIS — R0600 Dyspnea, unspecified: Secondary | ICD-10-CM | POA: Diagnosis not present

## 2015-03-26 DIAGNOSIS — Z8249 Family history of ischemic heart disease and other diseases of the circulatory system: Secondary | ICD-10-CM

## 2015-03-26 DIAGNOSIS — I890 Lymphedema, not elsewhere classified: Secondary | ICD-10-CM | POA: Diagnosis present

## 2015-03-26 DIAGNOSIS — Z86718 Personal history of other venous thrombosis and embolism: Secondary | ICD-10-CM | POA: Diagnosis not present

## 2015-03-26 DIAGNOSIS — Z09 Encounter for follow-up examination after completed treatment for conditions other than malignant neoplasm: Secondary | ICD-10-CM

## 2015-03-26 DIAGNOSIS — E8770 Fluid overload, unspecified: Secondary | ICD-10-CM | POA: Diagnosis not present

## 2015-03-26 DIAGNOSIS — Z800 Family history of malignant neoplasm of digestive organs: Secondary | ICD-10-CM

## 2015-03-26 DIAGNOSIS — Z96653 Presence of artificial knee joint, bilateral: Secondary | ICD-10-CM | POA: Diagnosis present

## 2015-03-26 DIAGNOSIS — Z833 Family history of diabetes mellitus: Secondary | ICD-10-CM | POA: Diagnosis not present

## 2015-03-26 DIAGNOSIS — Z7901 Long term (current) use of anticoagulants: Secondary | ICD-10-CM

## 2015-03-26 DIAGNOSIS — R0602 Shortness of breath: Secondary | ICD-10-CM | POA: Diagnosis not present

## 2015-03-26 DIAGNOSIS — J9601 Acute respiratory failure with hypoxia: Secondary | ICD-10-CM | POA: Diagnosis present

## 2015-03-26 DIAGNOSIS — I82402 Acute embolism and thrombosis of unspecified deep veins of left lower extremity: Secondary | ICD-10-CM | POA: Diagnosis present

## 2015-03-26 LAB — COMPREHENSIVE METABOLIC PANEL
ALBUMIN: 3 g/dL — AB (ref 3.5–5.0)
ALT: 16 U/L (ref 14–54)
ANION GAP: 9 (ref 5–15)
AST: 23 U/L (ref 15–41)
Alkaline Phosphatase: 91 U/L (ref 38–126)
BUN: 35 mg/dL — ABNORMAL HIGH (ref 6–20)
CO2: 28 mmol/L (ref 22–32)
Calcium: 9.4 mg/dL (ref 8.9–10.3)
Chloride: 99 mmol/L — ABNORMAL LOW (ref 101–111)
Creatinine, Ser: 1.8 mg/dL — ABNORMAL HIGH (ref 0.44–1.00)
GFR calc Af Amer: 28 mL/min — ABNORMAL LOW (ref >60)
GFR calc non Af Amer: 24 mL/min — ABNORMAL LOW (ref >60)
GLUCOSE: 115 mg/dL — AB (ref 65–99)
POTASSIUM: 4.8 mmol/L (ref 3.5–5.1)
SODIUM: 136 mmol/L (ref 135–145)
TOTAL PROTEIN: 6.6 g/dL (ref 6.5–8.1)
Total Bilirubin: 1.1 mg/dL (ref 0.3–1.2)

## 2015-03-26 LAB — CBC
HCT: 33.3 % — ABNORMAL LOW (ref 36.0–46.0)
HEMOGLOBIN: 10.8 g/dL — AB (ref 12.0–15.0)
MCH: 29.2 pg (ref 26.0–34.0)
MCHC: 32.4 g/dL (ref 30.0–36.0)
MCV: 90 fL (ref 78.0–100.0)
Platelets: 251 10*3/uL (ref 150–400)
RBC: 3.7 MIL/uL — AB (ref 3.87–5.11)
RDW: 14.4 % (ref 11.5–15.5)
WBC: 7.5 10*3/uL (ref 4.0–10.5)

## 2015-03-26 LAB — I-STAT TROPONIN, ED: TROPONIN I, POC: 0.04 ng/mL (ref 0.00–0.08)

## 2015-03-26 LAB — BRAIN NATRIURETIC PEPTIDE: B Natriuretic Peptide: 89.7 pg/mL (ref 0.0–100.0)

## 2015-03-26 MED ORDER — ACETAMINOPHEN 325 MG PO TABS: 650.0000 mg | ORAL_TABLET | Freq: Four times a day (QID) | ORAL | Status: DC | PRN

## 2015-03-26 MED ORDER — SODIUM CHLORIDE 0.9 % IV SOLN: 250.0000 mL | INTRAVENOUS | Status: DC | PRN

## 2015-03-26 MED ORDER — RIVAROXABAN 20 MG PO TABS
20.0000 mg | ORAL_TABLET | Freq: Every day | ORAL | Status: DC
  Administered 2015-03-26 – 2015-03-28 (×3): 20 mg via ORAL
  Filled 2015-03-26 (×3): qty 1

## 2015-03-26 MED ORDER — OMEGA-3-ACID ETHYL ESTERS 1 G PO CAPS
1.0000 g | ORAL_CAPSULE | Freq: Every day | ORAL | Status: DC
  Administered 2015-03-27 – 2015-03-29 (×3): 1 g via ORAL
  Filled 2015-03-26 (×3): qty 1

## 2015-03-26 MED ORDER — SODIUM CHLORIDE 0.9 % IJ SOLN: 3.0000 mL | INTRAMUSCULAR | Status: DC | PRN

## 2015-03-26 MED ORDER — ADULT MULTIVITAMIN W/MINERALS CH
1.0000 | ORAL_TABLET | Freq: Every day | ORAL | Status: DC
  Administered 2015-03-27 – 2015-03-29 (×3): 1 via ORAL
  Filled 2015-03-26 (×3): qty 1

## 2015-03-26 MED ORDER — DOCUSATE SODIUM 100 MG PO CAPS
100.0000 mg | ORAL_CAPSULE | Freq: Every day | ORAL | Status: DC
  Administered 2015-03-27 – 2015-03-29 (×3): 100 mg via ORAL
  Filled 2015-03-26 (×3): qty 1

## 2015-03-26 MED ORDER — POTASSIUM CHLORIDE CRYS ER 20 MEQ PO TBCR
20.0000 meq | EXTENDED_RELEASE_TABLET | Freq: Every day | ORAL | Status: DC
  Administered 2015-03-26 – 2015-03-29 (×4): 20 meq via ORAL
  Filled 2015-03-26 (×4): qty 1

## 2015-03-26 MED ORDER — IPRATROPIUM-ALBUTEROL 0.5-2.5 (3) MG/3ML IN SOLN
3.0000 mL | Freq: Four times a day (QID) | RESPIRATORY_TRACT | Status: DC
  Administered 2015-03-26 – 2015-03-27 (×2): 3 mL via RESPIRATORY_TRACT
  Filled 2015-03-26: qty 3

## 2015-03-26 MED ORDER — TRAMADOL HCL 50 MG PO TABS
50.0000 mg | ORAL_TABLET | Freq: Four times a day (QID) | ORAL | Status: DC | PRN
  Administered 2015-03-26 – 2015-03-29 (×7): 50 mg via ORAL
  Filled 2015-03-26 (×7): qty 1

## 2015-03-26 MED ORDER — ACETAMINOPHEN 325 MG PO TABS: 650.0000 mg | ORAL_TABLET | ORAL | Status: DC | PRN

## 2015-03-26 MED ORDER — FUROSEMIDE 10 MG/ML IJ SOLN
80.0000 mg | Freq: Once | INTRAMUSCULAR | Status: AC
  Administered 2015-03-26: 80 mg via INTRAVENOUS
  Filled 2015-03-26: qty 8

## 2015-03-26 MED ORDER — CEFAZOLIN SODIUM 1-5 GM-% IV SOLN
1.0000 g | Freq: Once | INTRAVENOUS | Status: AC
  Administered 2015-03-26: 1 g via INTRAVENOUS
  Filled 2015-03-26: qty 50

## 2015-03-26 MED ORDER — ONDANSETRON HCL 4 MG/2ML IJ SOLN: 4.0000 mg | Freq: Four times a day (QID) | INTRAMUSCULAR | Status: DC | PRN

## 2015-03-26 MED ORDER — SODIUM CHLORIDE 0.9 % IJ SOLN
3.0000 mL | Freq: Two times a day (BID) | INTRAMUSCULAR | Status: DC
  Administered 2015-03-27 – 2015-03-28 (×4): 3 mL via INTRAVENOUS

## 2015-03-26 MED ORDER — BUDESONIDE 0.25 MG/2ML IN SUSP
0.2500 mg | Freq: Two times a day (BID) | RESPIRATORY_TRACT | Status: DC
  Administered 2015-03-26 – 2015-03-29 (×6): 0.25 mg via RESPIRATORY_TRACT
  Filled 2015-03-26 (×6): qty 2

## 2015-03-26 MED ORDER — VITAMIN E 180 MG (400 UNIT) PO CAPS
400.0000 [IU] | ORAL_CAPSULE | Freq: Every day | ORAL | Status: DC
  Administered 2015-03-27 – 2015-03-29 (×3): 400 [IU] via ORAL
  Filled 2015-03-26 (×3): qty 1

## 2015-03-26 MED ORDER — METHYLCELLULOSE (LAXATIVE) PO POWD: 1.0000 | Freq: Every day | ORAL | Status: DC

## 2015-03-26 MED ORDER — PANTOPRAZOLE SODIUM 40 MG PO TBEC
40.0000 mg | DELAYED_RELEASE_TABLET | Freq: Every day | ORAL | Status: DC
  Administered 2015-03-27 – 2015-03-29 (×3): 40 mg via ORAL
  Filled 2015-03-26 (×3): qty 1

## 2015-03-26 MED ORDER — BENAZEPRIL HCL 40 MG PO TABS
40.0000 mg | ORAL_TABLET | Freq: Every day | ORAL | Status: DC
  Administered 2015-03-27 – 2015-03-29 (×3): 40 mg via ORAL
  Filled 2015-03-26 (×3): qty 1

## 2015-03-26 MED ORDER — CEFAZOLIN SODIUM-DEXTROSE 2-3 GM-% IV SOLR
2.0000 g | Freq: Two times a day (BID) | INTRAVENOUS | Status: DC
  Administered 2015-03-26 – 2015-03-27 (×3): 2 g via INTRAVENOUS
  Filled 2015-03-26 (×7): qty 50

## 2015-03-26 MED ORDER — ONE-DAILY MULTI VITAMINS PO TABS: 1.0000 | ORAL_TABLET | Freq: Every day | ORAL | Status: DC

## 2015-03-26 MED ORDER — LACTULOSE 10 GM/15ML PO SOLN: 10.0000 g | Freq: Every day | ORAL | Status: DC | PRN

## 2015-03-26 NOTE — Progress Notes (Signed)
Subjective:    Patient ID: Christina Martin, female    DOB: 24-May-1924, 81 y.o.   MRN: [id number]  DOS:  03/26/2015 Type of visit - description : Acute visit Interval history: In the last few weeks, edema has been getting worse and has developed redness on both pretibial areas. The area of redness is tender to palpation. Reports good compliance with medication, is trying to be careful with her sodium intake. Not taking any NSAIDs. Good compliance with Lasix and potassium.   Wt Readings from Last 3 Encounters:  03/26/15 262 lb 2 oz (118.899 kg)  01/19/15 246 lb (111.585 kg)  12/07/14 235 lb 4 oz (106.709 kg)     Review of Systems No fever or chills Had a single episode of chest pain at the R distal anterior chest 2 weeks ago, lasted a couple of days and then self resolves. Difficulty breathing definitely more than baseline. Sleeping in a recliner mostly because right hip pain  Past Medical History  Diagnosis Date  . Nonspecific elevation of levels of transaminase or lactic acid dehydrogenase (LDH) 1996    + Hepatitis srology  . GERD (gastroesophageal reflux disease)   . HTN (hypertension) 12/30/2007  . Diastolic dysfunction, left ventricle 05/30/2013    05/2013 see ECHO   . DJD (degenerative joint disease)   . Uterine cancer  (h/O)     no recent f/u   . ATONIC BLADDER-- has a permananet suprapubic catheter    . Prediabetes 04/07/2014  . Calculus in urethra     Dr. Diona Fanti    Past Surgical History  Procedure Laterality Date  . Esophageal dilation  2006  . Total knee arthroplasty  1996 & 2002  . Appendectomy    . Total abdominal hysterectomy w/ bilateral salpingoophorectomy  1990    uterine cancer  . Colonoscopy w/ polypectomy  1998    Tics in 2006; Dr Sharlett Iles  . Sbo  1991    adhesions resected  . Posterior laminectomy / decompression lumbar spine  2009    Dr Lorin Mercy  . Cataract surgery  2014    bilateral; Dr Prudencio Burly    Social History   Social History  . Marital  Status: Widowed    Spouse Name: N/A  . Number of Children: 3  . Years of Education: N/A   Occupational History  . Retired-office work    Social History Main Topics  . Smoking status: Former Smoker    Quit date: 05/22/1986  . Smokeless tobacco: Not on file     Comment: smoked 1944-1988 , up to 1 ppd  . Alcohol Use: No  . Drug Use: No  . Sexual Activity: Not on file   Other Topics Concern  . Not on file   Social History Narrative   Lives by herself, drives           Medication List       This list is accurate as of: 03/26/15  9:50 AM.  Always use your most recent med list.               acetaminophen 325 MG tablet  Commonly known as:  TYLENOL  Take 650 mg by mouth every 6 (six) hours as needed (pain).     benazepril 40 MG tablet  Commonly known as:  LOTENSIN  Take 1 tablet (40 mg total) by mouth daily.     CALCIUM 600 + D PO  Take 2 capsules by mouth daily. \     CITRUCEL  PO  Take 2 tablets by mouth daily.     Fish Oil 1200 MG Caps  Take 1 capsule by mouth daily.     furosemide 80 MG tablet  Commonly known as:  LASIX  TAKE 1 TABLET (80 MG TOTAL) BY MOUTH 2 (TWO) TIMES DAILY.     KLOR-CON M20 20 MEQ tablet  Generic drug:  potassium chloride SA  TAKE 1 TABLET BY MOUTH DAILY     lactulose 10 GM/15ML solution  Commonly known as:  CHRONULAC  Take 15 mLs (10 g total) by mouth daily as needed for mild constipation.     multivitamin tablet  Take 1 tablet by mouth daily.     omeprazole 20 MG capsule  Commonly known as:  PRILOSEC  Take 20 mg by mouth as directed. Take only on Sun, Tues,Thurs, Sat     STOOL SOFTENER PO  Take 1 capsule by mouth daily.     traMADol 50 MG tablet  Commonly known as:  ULTRAM  Take 1 tablet (50 mg total) by mouth 3 (three) times daily as needed.     vitamin E 400 UNIT capsule  Take 400 Units by mouth daily.     XARELTO 20 MG Tabs tablet  Generic drug:  rivaroxaban  TAKE 1 TABLET (20 MG TOTAL) BY MOUTH DAILY WITH SUPPER.            Objective:   Physical Exam  Skin:      BP 128/76 mmHg  Pulse 84  Temp(Src) 98.2 F (36.8 C) (Oral)  Resp 16  Ht 5\' 3"  (1.6 m)  Wt 262 lb 2 oz (118.899 kg)  BMI 46.44 kg/m2 General:   Well developed, well nourished . NAD.  HEENT:  Normocephalic . Face symmetric, atraumatic Neck: + JVD near the jaw  at 45. Lungs:  CTA B Normal respiratory effort, no intercostal retractions, no accessory muscle use. Heart: RRR,  no murmur.  + Pitting edema bilaterally, left calf is slightly larger than the right. Positive pedal pulses. Skin: Not pale. Not jaundice + redness and slightly tenderness anteriorly. See graphic Neurologic:  alert & oriented X3.  Speech normal, gait appropriate for age and unassisted . Pinprick examination: Decrease sensitivity of the feet Psych--  Cognition and judgment appear intact.  Cooperative with normal attention span and concentration.  Behavior appropriate. No anxious or depressed appearing.      Assessment & Plan:   Assessment>  Prediabetes w/ neuropathy ( + sx and decreased pinprick examination 03-2015) HTN DJD -- tramadol prn  GI: --GERD  --H/o Esophageal stricture  L leg DVT 08-2014, saw hematology 12-2014, idiopathic, rec   xarelto x 1 year CHF: echo 1062 diastolic dysfx, LVH, nl EF Dr Angelena Form Atonic  bladder, permanent suprapubic catheter Renal stones h/oUterine cancer  TAH/BSO (1990s) H/p Elevated LFTs (1990s, h/o  + hep serology)  Plan: CHF exacerbation:  Findings consistent with CHF exacerbaion, volume overload, she does not look in distress but  O2 sat 85% RA EKG today who acue changes .  No clear etiology for exacerbation, discussed  with cardiology, they recommended ER d/t Low O2 sat. After d/w [facility] ER , pt and her daughter I  rec to go to [facility] ER Stasis dermatitis +/- cellulitis : per hospital team    Today, I spent more than 42    min with the patient: >50% of the time counseling regards admission, coordinating her  care with different doctors.

## 2015-03-26 NOTE — Progress Notes (Signed)
Discussed Cr Cl with pharmacist. The calculator I used did not account for obesity. Her true Cr Cl is closer to 28. Xarelto is not indicated for DVT with a Cr Cl of less than 30 (no indication for dose reduction as with AF). Her current  is above her baseline. Will leave her on Xarelto for now but consider changing to Coumadin if her SCr doesn't come down.  Kerin Ransom PA-C 03/26/2015 4:09 PM

## 2015-03-26 NOTE — ED Notes (Signed)
Attempted report 

## 2015-03-26 NOTE — ED Notes (Signed)
Called Dr.Mcleans office to make aware pt is in ED.

## 2015-03-26 NOTE — ED Notes (Signed)
Pt reports SOB and increase in leg swelling over past week. Sent here for possible new onset CHF. EKG done at triage, spo2 94%.

## 2015-03-26 NOTE — Progress Notes (Signed)
ANTIBIOTIC CONSULT NOTE - INITIAL  Pharmacy Consult:  Ancef Indication:  Empiric therapy  Allergies  Allergen Reactions  . Lithostat [Acetohydroxamic Acid] Other (See Comments)    Unusual tiredness, weakness, nervousness, mental confusion, sore throat, Poor appetite    Patient Measurements: Height: 5' 2.5" (158.8 cm) Weight: 260 lb 4.8 oz (118.071 kg) IBW/kg (Calculated) : 51.25  Vital Signs: Temp: 97.9 F (36.6 C) (11/04 1553) Temp Source: Oral (11/04 1553) BP: 163/41 mmHg (11/04 1553) Pulse Rate: 88 (11/04 1553) Intake/Output from this shift: Total I/O In: -  Out: 450 [Urine:450]  Labs:  Recent Labs  03/26/15 1212  WBC 7.5  HGB 10.8*  PLT 251  CREATININE 1.80*   Estimated Creatinine Clearance: 26.6 mL/min (by C-G formula based on Cr of 1.8). No results for input(s): VANCOTROUGH, VANCOPEAK, VANCORANDOM, GENTTROUGH, GENTPEAK, GENTRANDOM, TOBRATROUGH, TOBRAPEAK, TOBRARND, AMIKACINPEAK, AMIKACINTROU, AMIKACIN in the last 72 hours.   Microbiology: No results found for this or any previous visit (from the past 720 hour(s)).  Medical History: Past Medical History  Diagnosis Date  . Nonspecific elevation of levels of transaminase or lactic acid dehydrogenase (LDH) 1996    + Hepatitis srology  . GERD (gastroesophageal reflux disease)   . HTN (hypertension) 12/30/2007  . Diastolic dysfunction, left ventricle 05/30/2013    05/2013 see ECHO   . DJD (degenerative joint disease)   . Uterine cancer  (h/O)     no recent f/u   . ATONIC BLADDER-- has a permananet suprapubic catheter    . Prediabetes 04/07/2014  . Calculus in urethra     Dr. Diona Fanti      Assessment: 19 YOF presented with weight gain and dyspnea.  Patient's bilateral lower extremities are warm and weeping and Pharmacy consulted to continue Ancef as empiric therapy.  She has acute on chronic CKD.   Goal of Therapy:  Infection prevention / Resolution of infection   Plan:  - Ancef 2gm IV Q12H -  Monitor renal fxn, clinical course, abx LOT - Consider holding Xarelto in setting of AKI; could switch to IV heparin bridge   Sutter Ahlgren D. Mina Marble, PharmD, BCPS Pager:  [phone] 03/26/2015, 4:09 PM

## 2015-03-26 NOTE — H&P (Signed)
[facility] History and Physical  Christina Martin XAJ:[id number] DOB: 05-27-1924 DOA: 03/26/2015  Referring physician: Emergency Department PCP: Christina November, MD   CHIEF COMPLAINT: Dyspnea, weight gain   HPI: Christina Martin is a 81 y.o. female sent by PCP to emergency room department today for fluid overload. Patient has a history of diastolic heart failure. She presented to PCPs office with weight gain, dyspnea and was found to be hypoxic with O2 saturation of 85% on room air.   Patient states she became more short of breath moving around her house last week. She is also had increasing bilateral lower extremity edema over the last week. Patient is compliant with Lasix. She tries to follow a low-sodium diet. She's gained several pounds over the last several days.  No cough, chest pain. No other complaints. Patient has a suprapubic catheter which is due to be changed next Tuesday. When emptying bladder patient has noticed slower stream than normal. Bowels are moving fine.  ED COURSE:     Labs:   WBC 7.5, hemoglobin 10.8, BUN 35, creatinine 1.8 Troponin 0.04 BNP 89   CXR:   No edema or consolidation  EKG:    Normal sinus rhythm Anteroseptal infarct , age undetermined Abnormal ECG  Medications  furosemide (LASIX) injection 80 mg (not administered)  benazepril (LOTENSIN) tablet 40 mg (not administered)  docusate sodium (COLACE) capsule 100 mg (not administered)  acetaminophen (TYLENOL) tablet 650 mg (not administered)  potassium chloride SA (K-DUR,KLOR-CON) CR tablet 20 mEq (not administered)  lactulose (CHRONULAC) 10 GM/15ML solution 10 g (not administered)  methylcellulose oral powder POWD 1 packet (not administered)  multivitamin tablet 1 tablet (not administered)  omega-3 acid ethyl esters (LOVAZA) capsule 1 g (not administered)  pantoprazole (PROTONIX) EC tablet 40 mg (not administered)  traMADol (ULTRAM) tablet 50 mg (not administered)  vitamin E capsule 400 Units (not  administered)  rivaroxaban (XARELTO) tablet 20 mg (not administered)  ceFAZolin (ANCEF) IVPB 1 g/50 mL premix (not administered)    Review of Systems  Constitutional: Negative.   HENT: Negative.   Eyes: Negative.   Respiratory: Positive for shortness of breath and wheezing.   Cardiovascular: Positive for leg swelling.  Gastrointestinal: Negative.   Genitourinary: Negative.   Musculoskeletal: Negative.   Skin: Negative.   Neurological: Negative.   Endo/Heme/Allergies: Negative.   Psychiatric/Behavioral: Negative.     Past Medical History  Diagnosis Date  . Nonspecific elevation of levels of transaminase or lactic acid dehydrogenase (LDH) 1996    + Hepatitis srology  . GERD (gastroesophageal reflux disease)   . HTN (hypertension) 12/30/2007  . Diastolic dysfunction, left ventricle 05/30/2013    05/2013 see ECHO   . DJD (degenerative joint disease)   . Uterine cancer  (h/O)     no recent f/u   . ATONIC BLADDER-- has a permananet suprapubic catheter    . Prediabetes 04/07/2014  . Calculus in urethra     Dr. Diona Fanti   Past Surgical History  Procedure Laterality Date  . Esophageal dilation  2006  . Total knee arthroplasty  1996 & 2002  . Appendectomy    . Total abdominal hysterectomy w/ bilateral salpingoophorectomy  1990    uterine cancer  . Colonoscopy w/ polypectomy  1998    Tics in 2006; Dr Sharlett Iles  . Sbo  1991    adhesions resected  . Posterior laminectomy / decompression lumbar spine  2009    Dr Lorin Mercy  . Cataract surgery  2014    bilateral; Dr  Lyles    SOCIAL HISTORY:  reports that she quit smoking about 28 years ago. She does not have any smokeless tobacco history on file. She reports that she does not drink alcohol or use illicit drugs. Lives:   At home    With:   Alone  Assistive devices:   Uses walker for ambulation.   Allergies  Allergen Reactions  . Lithostat [Acetohydroxamic Acid] Other (See Comments)    Unusual tiredness, weakness, nervousness,  mental confusion, sore throat, Poor appetite    Family History  Problem Relation Age of Onset  . Liver disease Sister     non hepatitis  . Colon cancer Mother   . Diabetes Mother     mother, brother  . Stroke Mother     > 16  . Heart failure Sister   . CAD Brother     brother, mother , sister  . Breast cancer Neg Hx      Prior to Admission medications   Medication Sig Start Date End Date Taking? Authorizing Provider  acetaminophen (TYLENOL) 325 MG tablet Take 650 mg by mouth every 6 (six) hours as needed (pain).     Historical Provider, MD  benazepril (LOTENSIN) 40 MG tablet Take 1 tablet (40 mg total) by mouth daily. 12/14/14   Colon Branch, MD  Calcium Carbonate-Vitamin D (CALCIUM 600 + D PO) Take 2 capsules by mouth daily. \    Historical Provider, MD  Docusate Calcium (STOOL SOFTENER PO) Take 1 capsule by mouth daily.     Historical Provider, MD  furosemide (LASIX) 80 MG tablet TAKE 1 TABLET (80 MG TOTAL) BY MOUTH 2 (TWO) TIMES DAILY. 02/15/15   Burnell Blanks, MD  KLOR-CON M20 20 MEQ tablet TAKE 1 TABLET BY MOUTH DAILY 02/15/15   Burnell Blanks, MD  lactulose (CHRONULAC) 10 GM/15ML solution Take 15 mLs (10 g total) by mouth daily as needed for mild constipation. 09/01/14   Colon Branch, MD  Methylcellulose, Laxative, (CITRUCEL PO) Take 2 tablets by mouth daily.    Historical Provider, MD  Multiple Vitamin (MULTIVITAMIN) tablet Take 1 tablet by mouth daily.      Historical Provider, MD  Omega-3 Fatty Acids (FISH OIL) 1200 MG CAPS Take 1 capsule by mouth daily.     Historical Provider, MD  omeprazole (PRILOSEC) 20 MG capsule Take 20 mg by mouth as directed. Take only on Sun, Tues,Thurs, Sat    Historical Provider, MD  traMADol (ULTRAM) 50 MG tablet Take 1 tablet (50 mg total) by mouth 3 (three) times daily as needed. 03/16/15   Colon Branch, MD  vitamin E 400 UNIT capsule Take 400 Units by mouth daily.      Historical Provider, MD  XARELTO 20 MG TABS tablet TAKE 1 TABLET (20  MG TOTAL) BY MOUTH DAILY WITH SUPPER. 03/22/15   Volanda Napoleon, MD   PHYSICAL EXAM: Filed Vitals:   03/26/15 1139 03/26/15 1157 03/26/15 1215 03/26/15 1351  BP: 138/48  116/42 152/45  Pulse:   88 92  Temp:      TempSrc:      Resp:    20  Height:      Weight:  118.842 kg (262 lb)    SpO2:   93% 92%    Wt Readings from Last 3 Encounters:  03/26/15 118.842 kg (262 lb)  03/26/15 118.899 kg (262 lb 2 oz)  01/19/15 111.585 kg (246 lb)    General:  Pleasant obese white female. Appears calm  and comfortable Eyes: PER, normal lids, irises & conjunctiva ENT: grossly normal hearing, lips & tongue Neck: no LAD, no masses Cardiovascular: RRR, pitting edema of bilateral lower extremities. Bilateral pretibial erythema with areas of denuded skin and mild weeping  Respiratory: Respirations even and unlabored. Normal respiratory effort. Bilateral lower lobe expiratory wheezes  Abdomen: soft, obese , suprapubic catheter intact , active bowel sounds. Mild left lower quadrant tenderness . No obvious masses.  Skin: Unremarkable except for bilateral extremity findings as noted above Musculoskeletal: grossly normal tone BUE/BLE Psychiatric: grossly normal mood and affect, speech fluent and appropriate Neurologic: grossly non-focal.         LABS ON ADMISSION:    Basic Metabolic Panel:  Recent Labs Lab 03/26/15 1212  NA 136  K 4.8  CL 99*  CO2 28  GLUCOSE 115*  BUN 35*  CREATININE 1.80*  CALCIUM 9.4   Liver Function Tests:  Recent Labs Lab 03/26/15 1212  AST 23  ALT 16  ALKPHOS 91  BILITOT 1.1  PROT 6.6  ALBUMIN 3.0*    CBC:  Recent Labs Lab 03/26/15 1212  WBC 7.5  HGB 10.8*  HCT 33.3*  MCV 90.0  PLT 251    BNP (last 3 results)  Recent Labs  03/26/15 1212  BNP 89.7    CREATININE: 1.8 mg/dL ABNORMAL (03/26/15 1212) Estimated creatinine clearance - 26.7 mL/min  Radiological Exams on Admission: Dg Chest 2 View  03/26/2015  CLINICAL DATA:  Shortness of breath  with lower extremity edema for 2 weeks EXAM: CHEST  2 VIEW COMPARISON:  January 19, 2015 FINDINGS: There is no edema or consolidation. Heart is upper normal in size with pulmonary vascularity within normal limits. No adenopathy. There is atherosclerotic calcification in aorta. Bones are osteoporotic. There is postoperative change in the right shoulder. IMPRESSION: No edema or consolidation. Electronically Signed   By: Lowella Grip III M.D.   On: 03/26/2015 12:59     ASSESSMENT / PLAN    1. Acute on chronic diastolic heart failure. Grade 1 diastolic dysfunction on last echocardiogram January 2016.  -Admit to telemetry -Cardiology has already evaluated the patient and recommends continuation of    ACEI and IV Lasix 80mg  BID.  -echocardiogram -Low-sodium diet -Daily weights -Strict input / output  2. PVD with bilateral lower extremity edema / erythema. Both lower extremities warm and weeping - Obtain ABI bilateral lower extremities -patient has received a dose of antibiotics in the emergency department, we will continue  antibiotics in the form of Ancef  2. Acute on chronic renal failure (stage 2-3). Baseline BUN 26 / creatinine 1.4, up to 35 and 1.8 today. GFR of 24.  -continue with lasix and ACE but with close renal monitoring.   3.  LLE DVT.  -continue home Xarelto  4. Hypertension. Stable -Continue home Benazepril, Lasix  5. GERD.  -continue home PPI  6. Chronic constipation.  -continue home Chronulac, colace, fiber  7. Chronic suprapubic catheter since back surgery several years ago. She is due for change next week. Complains of slow stream when emptying.  -Will check a urinalysis.   CONSULTANTS:   Cardiology- Dr.Skains  Code Status: Do not intubate DVT Prophylaxis: Continue home Xarelto Family Communication:   Patient alert, oriented and understands plan of care. Daughter at bedside and also understands and agrees with the plan.  Disposition Plan: Discharge to home  in 3-4 days   Time spent: 60 minutes Tye Savoy  NP [facility] Pager [phone]

## 2015-03-26 NOTE — Consult Note (Signed)
Reason for Consult:   Edema, wgt gain, dyspnea  Requesting Physician: ED Primary Cardiologist Dr Julianne Handler  HPI:   Pleasant 81 y/o obese female seen by Dr Julianne Handler in Jan 20156 for LE edema. She has a past history of HTN and CRI.  Her wgt then was 258. Echo showed preserved LVF with grade 1 diastolic dysfunction. She responded well to Lasix and her wgt was down to 241 with improvement in her edema when she was seen in Feb 2015. In April this year she was diagnosed with a DVT Lt leg and placed on Xarelto with plans to continue it for a year (Dr Marin Olp). Today she went to Dr Ethel Rana office with complaints of increasing LE edema and dyspnea x 2 weeks. Her wgt is 262. She was sent to the ED for further evaluation. She has been sleeping in a recliner but this has been because of pain in her hip. Her CXR does not show CHF, BNP is pending.   PMHx:  Past Medical History  Diagnosis Date  . Nonspecific elevation of levels of transaminase or lactic acid dehydrogenase (LDH) 1996    + Hepatitis srology  . GERD (gastroesophageal reflux disease)   . HTN (hypertension) 12/30/2007  . Diastolic dysfunction, left ventricle 05/30/2013    05/2013 see ECHO   . DJD (degenerative joint disease)   . Uterine cancer  (h/O)     no recent f/u   . ATONIC BLADDER-- has a permananet suprapubic catheter    . Prediabetes 04/07/2014  . Calculus in urethra     Dr. Diona Fanti    Past Surgical History  Procedure Laterality Date  . Esophageal dilation  2006  . Total knee arthroplasty  1996 & 2002  . Appendectomy    . Total abdominal hysterectomy w/ bilateral salpingoophorectomy  1990    uterine cancer  . Colonoscopy w/ polypectomy  1998    Tics in 2006; Dr Sharlett Iles  . Sbo  1991    adhesions resected  . Posterior laminectomy / decompression lumbar spine  2009    Dr Lorin Mercy  . Cataract surgery  2014    bilateral; Dr Prudencio Burly    SOCHx:  reports that she quit smoking about 28 years ago. She does not have  any smokeless tobacco history on file. She reports that she does not drink alcohol or use illicit drugs.  FAMHx: Family History  Problem Relation Age of Onset  . Liver disease Sister     non hepatitis  . Colon cancer Mother   . Diabetes Mother     mother, brother  . Stroke Mother     > 72  . Heart failure Sister   . CAD Brother     brother, mother , sister  . Breast cancer Neg Hx     ALLERGIES: Allergies  Allergen Reactions  . Lithostat [Acetohydroxamic Acid] Other (See Comments)    Unusual tiredness, weakness, nervousness, mental confusion, sore throat, Poor appetite    ROS: Review of Systems: General: negative for chills, fever, night sweats or weight changes.  Cardiovascular: negative for chest pain, dyspnea on exertion, orthopnea, palpitations HEENT: negative for any visual disturbances, blindness, glaucoma Dermatological: blisters an redness in both LE Respiratory: negative for cough, hemoptysis, or wheezing Urologic: negative for hematuria or dysuria Abdominal: negative for nausea, vomiting, diarrhea, bright red blood per rectum, melena, or hematemesis Neurologic: negative for visual changes, syncope, or dizziness Musculoskeletal: negative for back pain, joint pain, or swelling Psych:  cooperative and appropriate All other systems reviewed and are otherwise negative except as noted above.   HOME MEDICATIONS: Prior to Admission medications   Medication Sig Start Date End Date Taking? Authorizing Provider  acetaminophen (TYLENOL) 325 MG tablet Take 650 mg by mouth every 6 (six) hours as needed (pain).     Historical Provider, MD  benazepril (LOTENSIN) 40 MG tablet Take 1 tablet (40 mg total) by mouth daily. 12/14/14   Colon Branch, MD  Calcium Carbonate-Vitamin D (CALCIUM 600 + D PO) Take 2 capsules by mouth daily. \    Historical Provider, MD  Docusate Calcium (STOOL SOFTENER PO) Take 1 capsule by mouth daily.     Historical Provider, MD  furosemide (LASIX) 80 MG  tablet TAKE 1 TABLET (80 MG TOTAL) BY MOUTH 2 (TWO) TIMES DAILY. 02/15/15   Burnell Blanks, MD  KLOR-CON M20 20 MEQ tablet TAKE 1 TABLET BY MOUTH DAILY 02/15/15   Burnell Blanks, MD  lactulose (CHRONULAC) 10 GM/15ML solution Take 15 mLs (10 g total) by mouth daily as needed for mild constipation. 09/01/14   Colon Branch, MD  Methylcellulose, Laxative, (CITRUCEL PO) Take 2 tablets by mouth daily.    Historical Provider, MD  Multiple Vitamin (MULTIVITAMIN) tablet Take 1 tablet by mouth daily.      Historical Provider, MD  Omega-3 Fatty Acids (FISH OIL) 1200 MG CAPS Take 1 capsule by mouth daily.     Historical Provider, MD  omeprazole (PRILOSEC) 20 MG capsule Take 20 mg by mouth as directed. Take only on Sun, Tues,Thurs, Sat    Historical Provider, MD  traMADol (ULTRAM) 50 MG tablet Take 1 tablet (50 mg total) by mouth 3 (three) times daily as needed. 03/16/15   Colon Branch, MD  vitamin E 400 UNIT capsule Take 400 Units by mouth daily.      Historical Provider, MD  XARELTO 20 MG TABS tablet TAKE 1 TABLET (20 MG TOTAL) BY MOUTH DAILY WITH SUPPER. 03/22/15   Volanda Napoleon, MD    HOSPITAL MEDICATIONS: I have reviewed the patient's current medications.  VITALS: Blood pressure 116/42, pulse 88, temperature 97.9 F (36.6 C), temperature source Oral, resp. rate 20, height 5' 2.5" (1.588 m), weight 262 lb (118.842 kg), SpO2 93 %.  PHYSICAL EXAM: General appearance: alert, cooperative, no distress and moderately obese Neck: no JVD and RCA bruit Lungs: crackles Lt base Heart: regular rate and rhythm Abdomen: obese, non tender Extremities: venous stasis dermatitis noted and 2+ bilateral edema with blisters and redness Pulses: 2+ and symmetric Skin: Skin color, texture, turgor normal. No rashes or lesions Neurologic: Grossly normal  LABS: Results for orders placed or performed during the hospital encounter of 03/26/15 (from the past 24 hour(s))  CBC     Status: Abnormal   Collection  Time: 03/26/15 12:12 PM  Result Value Ref Range   WBC 7.5 4.0 - 10.5 K/uL   RBC 3.70 (L) 3.87 - 5.11 MIL/uL   Hemoglobin 10.8 (L) 12.0 - 15.0 g/dL   HCT 33.3 (L) 36.0 - 46.0 %   MCV 90.0 78.0 - 100.0 fL   MCH 29.2 26.0 - 34.0 pg   MCHC 32.4 30.0 - 36.0 g/dL   RDW 14.4 11.5 - 15.5 %   Platelets 251 150 - 400 K/uL  Comprehensive metabolic panel     Status: Abnormal   Collection Time: 03/26/15 12:12 PM  Result Value Ref Range   Sodium 136 135 - 145 mmol/L   Potassium  4.8 3.5 - 5.1 mmol/L   Chloride 99 (L) 101 - 111 mmol/L   CO2 28 22 - 32 mmol/L   Glucose, Bld 115 (H) 65 - 99 mg/dL   BUN 35 (H) 6 - 20 mg/dL   Creatinine, Ser 1.80 (H) 0.44 - 1.00 mg/dL   Calcium 9.4 8.9 - 10.3 mg/dL   Total Protein 6.6 6.5 - 8.1 g/dL   Albumin 3.0 (L) 3.5 - 5.0 g/dL   AST 23 15 - 41 U/L   ALT 16 14 - 54 U/L   Alkaline Phosphatase 91 38 - 126 U/L   Total Bilirubin 1.1 0.3 - 1.2 mg/dL   GFR calc non Af Amer 24 (L) >60 mL/min   GFR calc Af Amer 28 (L) >60 mL/min   Anion gap 9 5 - 15  I-stat troponin, ED (not at [facility], [facility])     Status: None   Collection Time: 03/26/15 12:23 PM  Result Value Ref Range   Troponin i, poc 0.04 0.00 - 0.08 ng/mL   Comment 3            EKG: NSR, septal Qs (old)  IMAGING: Dg Chest 2 View  03/26/2015  CLINICAL DATA:  Shortness of breath with lower extremity edema for 2 weeks EXAM: CHEST  2 VIEW COMPARISON:  January 19, 2015 FINDINGS: There is no edema or consolidation. Heart is upper normal in size with pulmonary vascularity within normal limits. No adenopathy. There is atherosclerotic calcification in aorta. Bones are osteoporotic. There is postoperative change in the right shoulder. IMPRESSION: No edema or consolidation. Electronically Signed   By: Lowella Grip III M.D.   On: 03/26/2015 12:59    IMPRESSION: Active Problems:   Acute on chronic diastolic (congestive) heart failure (HCC)   Dyspnea   Edema of both legs   HTN (hypertension)   Left leg DVT  08-2014    Anticoagulated-Xarelto   GERD   Severe obesity (BMI >= 40) (HCC)   RECOMMENDATION: Will use current lasix dose and give IV. Monitor renal function. Continue  ACE for now. She may have superimposed cellulitis, defer to internal medicine. Continue Xarelto (Cr Cl =40)  Time Spent Directly with Patient: 45 minutes  KILROY,LUKE K [phone] beeper 03/26/2015, 1:24 PM   Personally seen and examined. Agree with above.  81 year old female with 20 pound weight gain, blistered lower extremities, acute on chronic diastolic heart failure with chronic lower extremity edema,  Chronic erythema, dyspnea.   - Lasix 80 mg IV twice a day  - Continue Xarelto deep vein thrombosis  - She will likely need a few days of diuresis, monitor basic metabolic profile, creatinine slightly elevated. Continue ACE inhibitor unless blood pressure becomes an issue.  - Continue to encourage weight loss  - Question if she potentially may have a degree of cellulitis however she and her daughter do state that the erythema on her lower extremities is chronic but the blistering is new.  We will follow along.  Candee Furbish, MD

## 2015-03-26 NOTE — Patient Instructions (Signed)
Please go to the ER a [facility] I already discussed the case with cardiology

## 2015-03-26 NOTE — ED Provider Notes (Signed)
CSN: [id number]     Arrival date & time 03/26/15  1124 History   First MD Initiated Contact with Patient 03/26/15 1132     Chief Complaint  Patient presents with  . Leg Swelling  . Shortness of Breath     (Consider location/radiation/quality/duration/timing/severity/associated sxs/prior Treatment) The history is provided by the patient and medical records. No language interpreter was used.     Christina Martin is a 81 y.o. female  with a hx of GERD, HTN, CHF (diastolic dysfunction), DJD, atonic bladder with suprapubic catheter, bladder calculi, DVT (left leg - on Xarelto)  presents to the Emergency Department complaining of gradual, persistent, progressively worsening leg swelling, 15 lb weight gain and increased SOb especially with walking onset 1.5 weeks ago.  Pt taking 40mg  lasix BID as directed.  Patient reports a single episode of left chest pain under the left breast 2 weeks ago, constant for several days and then resolving spontaneously. Patient reports that she sleeps in her recliner and she believes that she pulled a muscle in her left chest from working the lever.  No chest pain at this time. Pt reports associated pain in her legs from the swelling.  Nothing makes it better. Pt denies fever, chills, headache neck pain, abdominal pain, nausea, vomiting, diarrhea, weakness, dizziness, syncope.    Pt was seen at Dr. Ethel Rana office this morning who consulted with her cardiologist who recommended she present to the ED.     Past Medical History  Diagnosis Date  . Nonspecific elevation of levels of transaminase or lactic acid dehydrogenase (LDH) 1996    + Hepatitis srology  . GERD (gastroesophageal reflux disease)   . HTN (hypertension) 12/30/2007  . Diastolic dysfunction, left ventricle 05/30/2013    05/2013 see ECHO   . DJD (degenerative joint disease)   . Uterine cancer  (h/O)     no recent f/u   . ATONIC BLADDER-- has a permananet suprapubic catheter    . Prediabetes 04/07/2014  .  Calculus in urethra     Dr. Diona Fanti   Past Surgical History  Procedure Laterality Date  . Esophageal dilation  2006  . Total knee arthroplasty  1996 & 2002  . Appendectomy    . Total abdominal hysterectomy w/ bilateral salpingoophorectomy  1990    uterine cancer  . Colonoscopy w/ polypectomy  1998    Tics in 2006; Dr Sharlett Iles  . Sbo  1991    adhesions resected  . Posterior laminectomy / decompression lumbar spine  2009    Dr Lorin Mercy  . Cataract surgery  2014    bilateral; Dr Prudencio Burly   Family History  Problem Relation Age of Onset  . Liver disease Sister     non hepatitis  . Colon cancer Mother   . Diabetes Mother     mother, brother  . Stroke Mother     > 14  . Heart failure Sister   . CAD Brother     brother, mother , sister  . Breast cancer Neg Hx    Social History  Substance Use Topics  . Smoking status: Former Smoker    Quit date: 05/22/1986  . Smokeless tobacco: None     Comment: smoked 1944-1988 , up to 1 ppd  . Alcohol Use: No   OB History    No data available     Review of Systems  Constitutional: Negative for fever, diaphoresis, appetite change, fatigue and unexpected weight change.  HENT: Negative for mouth sores.  Eyes: Negative for visual disturbance.  Respiratory: Positive for shortness of breath and wheezing. Negative for cough and chest tightness.   Cardiovascular: Positive for leg swelling. Negative for chest pain.  Gastrointestinal: Negative for nausea, vomiting, abdominal pain, diarrhea and constipation.  Endocrine: Negative for polydipsia, polyphagia and polyuria.  Genitourinary: Negative for dysuria, urgency, frequency and hematuria.  Musculoskeletal: Negative for back pain and neck stiffness.  Skin: Negative for rash.  Allergic/Immunologic: Negative for immunocompromised state.  Neurological: Negative for syncope, light-headedness and headaches.  Hematological: Does not bruise/bleed easily.  Psychiatric/Behavioral: Negative for sleep  disturbance. The patient is not nervous/anxious.       Allergies  Lithostat  Home Medications   Prior to Admission medications   Medication Sig Start Date End Date Taking? Authorizing Provider  acetaminophen (TYLENOL) 325 MG tablet Take 650 mg by mouth every 6 (six) hours as needed (pain).     Historical Provider, MD  benazepril (LOTENSIN) 40 MG tablet Take 1 tablet (40 mg total) by mouth daily. 12/14/14   Colon Branch, MD  Calcium Carbonate-Vitamin D (CALCIUM 600 + D PO) Take 2 capsules by mouth daily. \    Historical Provider, MD  Docusate Calcium (STOOL SOFTENER PO) Take 1 capsule by mouth daily.     Historical Provider, MD  furosemide (LASIX) 80 MG tablet TAKE 1 TABLET (80 MG TOTAL) BY MOUTH 2 (TWO) TIMES DAILY. 02/15/15   Burnell Blanks, MD  KLOR-CON M20 20 MEQ tablet TAKE 1 TABLET BY MOUTH DAILY 02/15/15   Burnell Blanks, MD  lactulose (CHRONULAC) 10 GM/15ML solution Take 15 mLs (10 g total) by mouth daily as needed for mild constipation. 09/01/14   Colon Branch, MD  Methylcellulose, Laxative, (CITRUCEL PO) Take 2 tablets by mouth daily.    Historical Provider, MD  Multiple Vitamin (MULTIVITAMIN) tablet Take 1 tablet by mouth daily.      Historical Provider, MD  Omega-3 Fatty Acids (FISH OIL) 1200 MG CAPS Take 1 capsule by mouth daily.     Historical Provider, MD  omeprazole (PRILOSEC) 20 MG capsule Take 20 mg by mouth as directed. Take only on Sun, Tues,Thurs, Sat    Historical Provider, MD  traMADol (ULTRAM) 50 MG tablet Take 1 tablet (50 mg total) by mouth 3 (three) times daily as needed. 03/16/15   Colon Branch, MD  vitamin E 400 UNIT capsule Take 400 Units by mouth daily.      Historical Provider, MD  XARELTO 20 MG TABS tablet TAKE 1 TABLET (20 MG TOTAL) BY MOUTH DAILY WITH SUPPER. 03/22/15   Volanda Napoleon, MD   BP 152/45 mmHg  Pulse 92  Temp(Src) 97.9 F (36.6 C) (Oral)  Resp 20  Ht 5' 2.5" (1.588 m)  Wt 262 lb (118.842 kg)  BMI 47.13 kg/m2  SpO2 92% Physical  Exam  Constitutional: She appears well-developed and well-nourished. No distress.  Awake, alert, nontoxic appearance  HENT:  Head: Normocephalic and atraumatic.  Mouth/Throat: Oropharynx is clear and moist. No oropharyngeal exudate.  Eyes: Conjunctivae are normal. No scleral icterus.  Neck: Normal range of motion. Neck supple.  Cardiovascular: Normal rate, regular rhythm, normal heart sounds and intact distal pulses.   Pulmonary/Chest: Effort normal. No respiratory distress. She has wheezes.  Equal chest expansion Mild expiratory wheezing throughout  Abdominal: Soft. Bowel sounds are normal. She exhibits no mass. There is no tenderness. There is no rebound and no guarding.  Musculoskeletal: Normal range of motion. She exhibits edema.  3+ pitting  edema with multiple areas of erythema without increased warmth to the bilateral legs; edema extends through the thighs into the groin Tenderness to palpation of the edematous areas Well-healed surgical incisions from total knee replacements  Neurological: She is alert.  Speech is clear and goal oriented Moves extremities without ataxia  Skin: Skin is warm and dry. She is not diaphoretic. There is erythema.  Erythema of the anterior lower legs with some faint streaking into the thighs and extension around the ankles  Psychiatric: She has a normal mood and affect.  Nursing note and vitals reviewed.   ED Course  Procedures (including critical care time) Labs Review Labs Reviewed  CBC - Abnormal; Notable for the following:    RBC 3.70 (*)    Hemoglobin 10.8 (*)    HCT 33.3 (*)    All other components within normal limits  COMPREHENSIVE METABOLIC PANEL - Abnormal; Notable for the following:    Chloride 99 (*)    Glucose, Bld 115 (*)    BUN 35 (*)    Creatinine, Ser 1.80 (*)    Albumin 3.0 (*)    GFR calc non Af Amer 24 (*)    GFR calc Af Amer 28 (*)    All other components within normal limits  CULTURE, BLOOD (ROUTINE X 2)  CULTURE,  BLOOD (ROUTINE X 2)  BRAIN NATRIURETIC PEPTIDE  I-STAT TROPOININ, ED    Imaging Review Dg Chest 2 View  03/26/2015  CLINICAL DATA:  Shortness of breath with lower extremity edema for 2 weeks EXAM: CHEST  2 VIEW COMPARISON:  January 19, 2015 FINDINGS: There is no edema or consolidation. Heart is upper normal in size with pulmonary vascularity within normal limits. No adenopathy. There is atherosclerotic calcification in aorta. Bones are osteoporotic. There is postoperative change in the right shoulder. IMPRESSION: No edema or consolidation. Electronically Signed   By: Lowella Grip III M.D.   On: 03/26/2015 12:59   I have personally reviewed and evaluated these images and lab results as part of my medical decision-making.   EKG Interpretation None      MDM   Final diagnoses:  Acute on chronic diastolic (congestive) heart failure (HCC)  Edema of both legs  Anticoagulated-Xarelto  Dyspnea  Cellulitis of lower extremity, unspecified laterality  Acute on chronic renal failure (HCC)   Christina Martin presents from her primary care office with 15 pound weight gain, peripheral edema and shortness of breath. Patient with a history of diastolic dysfunction.  Patient with hypoxia to 85% on her arrival at her primary care office. 92% on arrival to the ED. She does not wear home oxygen. This appears to be a CHF exacerbation.  1:57 PM Labs are reassuring. EKG without ischemia. Troponin 0.04. Worsening acute on chronic renal failure. Patient has been evaluated by Dr. Marlou Porch of cardiology he wishes for hospitalist admission. They will follow for her CHF exacerbation.  Concern for possible cellulitis of her legs. Will begin cefazolin.  Blood cultures pending. No evidence of sepsis.  2:13 PM  Discussed with [facility] who will admit.  BP 152/45 mmHg  Pulse 92  Temp(Src) 97.9 F (36.6 C) (Oral)  Resp 20  Ht 5' 2.5" (1.588 m)  Wt 262 lb (118.842 kg)  BMI 47.13 kg/m2  SpO2 92%   Abigail Butts, PA-C 03/26/15 [place], MD 03/26/15 [phone]

## 2015-03-26 NOTE — Progress Notes (Signed)
Pre visit review using our clinic review tool, if applicable. No additional management support is needed unless otherwise documented below in the visit note. 

## 2015-03-27 ENCOUNTER — Other Ambulatory Visit (HOSPITAL_COMMUNITY): Payer: PRIVATE HEALTH INSURANCE

## 2015-03-27 ENCOUNTER — Observation Stay (HOSPITAL_COMMUNITY): Payer: Medicare Other

## 2015-03-27 DIAGNOSIS — D649 Anemia, unspecified: Secondary | ICD-10-CM | POA: Diagnosis present

## 2015-03-27 DIAGNOSIS — Z800 Family history of malignant neoplasm of digestive organs: Secondary | ICD-10-CM | POA: Diagnosis not present

## 2015-03-27 DIAGNOSIS — L03115 Cellulitis of right lower limb: Secondary | ICD-10-CM

## 2015-03-27 DIAGNOSIS — Z9359 Other cystostomy status: Secondary | ICD-10-CM

## 2015-03-27 DIAGNOSIS — I890 Lymphedema, not elsewhere classified: Secondary | ICD-10-CM | POA: Diagnosis present

## 2015-03-27 DIAGNOSIS — L03116 Cellulitis of left lower limb: Secondary | ICD-10-CM | POA: Diagnosis not present

## 2015-03-27 DIAGNOSIS — K219 Gastro-esophageal reflux disease without esophagitis: Secondary | ICD-10-CM | POA: Diagnosis present

## 2015-03-27 DIAGNOSIS — N189 Chronic kidney disease, unspecified: Secondary | ICD-10-CM

## 2015-03-27 DIAGNOSIS — I130 Hypertensive heart and chronic kidney disease with heart failure and stage 1 through stage 4 chronic kidney disease, or unspecified chronic kidney disease: Secondary | ICD-10-CM | POA: Diagnosis present

## 2015-03-27 DIAGNOSIS — Z90710 Acquired absence of both cervix and uterus: Secondary | ICD-10-CM | POA: Diagnosis not present

## 2015-03-27 DIAGNOSIS — I10 Essential (primary) hypertension: Secondary | ICD-10-CM

## 2015-03-27 DIAGNOSIS — Z833 Family history of diabetes mellitus: Secondary | ICD-10-CM | POA: Diagnosis not present

## 2015-03-27 DIAGNOSIS — R609 Edema, unspecified: Secondary | ICD-10-CM | POA: Diagnosis not present

## 2015-03-27 DIAGNOSIS — N179 Acute kidney failure, unspecified: Secondary | ICD-10-CM

## 2015-03-27 DIAGNOSIS — N312 Flaccid neuropathic bladder, not elsewhere classified: Secondary | ICD-10-CM | POA: Diagnosis not present

## 2015-03-27 DIAGNOSIS — R0600 Dyspnea, unspecified: Secondary | ICD-10-CM | POA: Diagnosis not present

## 2015-03-27 DIAGNOSIS — Z09 Encounter for follow-up examination after completed treatment for conditions other than malignant neoplasm: Secondary | ICD-10-CM | POA: Insufficient documentation

## 2015-03-27 DIAGNOSIS — K5900 Constipation, unspecified: Secondary | ICD-10-CM | POA: Diagnosis present

## 2015-03-27 DIAGNOSIS — Z96653 Presence of artificial knee joint, bilateral: Secondary | ICD-10-CM | POA: Diagnosis present

## 2015-03-27 DIAGNOSIS — E8770 Fluid overload, unspecified: Secondary | ICD-10-CM | POA: Diagnosis present

## 2015-03-27 DIAGNOSIS — Z8249 Family history of ischemic heart disease and other diseases of the circulatory system: Secondary | ICD-10-CM | POA: Diagnosis not present

## 2015-03-27 DIAGNOSIS — Z7901 Long term (current) use of anticoagulants: Secondary | ICD-10-CM | POA: Diagnosis not present

## 2015-03-27 DIAGNOSIS — I5033 Acute on chronic diastolic (congestive) heart failure: Secondary | ICD-10-CM | POA: Diagnosis not present

## 2015-03-27 DIAGNOSIS — Z86718 Personal history of other venous thrombosis and embolism: Secondary | ICD-10-CM | POA: Diagnosis not present

## 2015-03-27 DIAGNOSIS — R600 Localized edema: Secondary | ICD-10-CM

## 2015-03-27 DIAGNOSIS — Z87891 Personal history of nicotine dependence: Secondary | ICD-10-CM | POA: Diagnosis not present

## 2015-03-27 DIAGNOSIS — Z8542 Personal history of malignant neoplasm of other parts of uterus: Secondary | ICD-10-CM | POA: Diagnosis not present

## 2015-03-27 DIAGNOSIS — L03119 Cellulitis of unspecified part of limb: Secondary | ICD-10-CM | POA: Diagnosis not present

## 2015-03-27 DIAGNOSIS — N183 Chronic kidney disease, stage 3 (moderate): Secondary | ICD-10-CM | POA: Diagnosis present

## 2015-03-27 DIAGNOSIS — J9601 Acute respiratory failure with hypoxia: Secondary | ICD-10-CM | POA: Diagnosis present

## 2015-03-27 DIAGNOSIS — Z823 Family history of stroke: Secondary | ICD-10-CM | POA: Diagnosis not present

## 2015-03-27 DIAGNOSIS — Z6841 Body Mass Index (BMI) 40.0 and over, adult: Secondary | ICD-10-CM | POA: Diagnosis not present

## 2015-03-27 DIAGNOSIS — I739 Peripheral vascular disease, unspecified: Secondary | ICD-10-CM | POA: Diagnosis present

## 2015-03-27 LAB — CBC
HCT: 32.5 % — ABNORMAL LOW (ref 36.0–46.0)
HEMOGLOBIN: 10.5 g/dL — AB (ref 12.0–15.0)
MCH: 28.8 pg (ref 26.0–34.0)
MCHC: 32.3 g/dL (ref 30.0–36.0)
MCV: 89.3 fL (ref 78.0–100.0)
PLATELETS: 253 10*3/uL (ref 150–400)
RBC: 3.64 MIL/uL — AB (ref 3.87–5.11)
RDW: 14.5 % (ref 11.5–15.5)
WBC: 6.8 10*3/uL (ref 4.0–10.5)

## 2015-03-27 LAB — BASIC METABOLIC PANEL
ANION GAP: 9 (ref 5–15)
BUN: 35 mg/dL — ABNORMAL HIGH (ref 6–20)
CO2: 26 mmol/L (ref 22–32)
Calcium: 9.1 mg/dL (ref 8.9–10.3)
Chloride: 102 mmol/L (ref 101–111)
Creatinine, Ser: 1.83 mg/dL — ABNORMAL HIGH (ref 0.44–1.00)
GFR, EST AFRICAN AMERICAN: 27 mL/min — AB (ref >60)
GFR, EST NON AFRICAN AMERICAN: 24 mL/min — AB (ref >60)
GLUCOSE: 128 mg/dL — AB (ref 65–99)
POTASSIUM: 5 mmol/L (ref 3.5–5.1)
Sodium: 137 mmol/L (ref 135–145)

## 2015-03-27 MED ORDER — IPRATROPIUM-ALBUTEROL 0.5-2.5 (3) MG/3ML IN SOLN
3.0000 mL | Freq: Two times a day (BID) | RESPIRATORY_TRACT | Status: DC
  Administered 2015-03-27 – 2015-03-29 (×4): 3 mL via RESPIRATORY_TRACT
  Filled 2015-03-27 (×4): qty 3

## 2015-03-27 MED ORDER — FUROSEMIDE 10 MG/ML IJ SOLN
60.0000 mg | Freq: Once | INTRAMUSCULAR | Status: AC
  Administered 2015-03-27: 60 mg via INTRAVENOUS
  Filled 2015-03-27: qty 6

## 2015-03-27 NOTE — Progress Notes (Signed)
Patient ID: Christina Martin, female   DOB: April 15, 1925, 81 y.o.   MRN: [id number]    Subjective:  Denies SSCP, palpitations or Dyspnea Sitting in chair   Objective:  Filed Vitals:   03/27/15 0036 03/27/15 0500 03/27/15 0748 03/27/15 0802  BP: 119/43 124/48 100/38   Pulse: 87 91 90   Temp: 98.1 F (36.7 C) 98.2 F (36.8 C) 98.5 F (36.9 C)   TempSrc: Oral Oral Oral   Resp: 18 20 20    Height:      Weight:  116.847 kg (257 lb 9.6 oz)    SpO2: 100% 94% 91% 94%    Intake/Output from previous day:  Intake/Output Summary (Last 24 hours) at 03/27/15 0944 Last data filed at 03/27/15 9562  Gross per 24 hour  Intake    630 ml  Output   1925 ml  Net  -1295 ml    Physical Exam: Affect appropriate Chronically ill white female  HEENT: normal Neck supple with no adenopathy JVP normal no bruits no thyromegaly Lungs clear with no wheezing and good diaphragmatic motion Heart:  S1/S2 SEM  murmur, no rub, gallop or click PMI normal Abdomen: benighn, BS positve, no tenderness, no AAA no bruit.  No HSM or HJR Distal pulses intact with no bruits Plus 3 bilateral LE  Edema with erythema  Neuro non-focal Skin warm and dry No muscular weakness   Lab Results: Basic Metabolic Panel:  Recent Labs  03/26/15 1212  NA 136  K 4.8  CL 99*  CO2 28  GLUCOSE 115*  BUN 35*  CREATININE 1.80*  CALCIUM 9.4   Liver Function Tests:  Recent Labs  03/26/15 1212  AST 23  ALT 16  ALKPHOS 91  BILITOT 1.1  PROT 6.6  ALBUMIN 3.0*   CBC:  Recent Labs  03/26/15 1212 03/27/15 0416  WBC 7.5 6.8  HGB 10.8* 10.5*  HCT 33.3* 32.5*  MCV 90.0 89.3  PLT 251 253    Imaging: Dg Chest 2 View  03/26/2015  CLINICAL DATA:  Shortness of breath with lower extremity edema for 2 weeks EXAM: CHEST  2 VIEW COMPARISON:  January 19, 2015 FINDINGS: There is no edema or consolidation. Heart is upper normal in size with pulmonary vascularity within normal limits. No adenopathy. There is atherosclerotic  calcification in aorta. Bones are osteoporotic. There is postoperative change in the right shoulder. IMPRESSION: No edema or consolidation. Electronically Signed   By: Lowella Grip III M.D.   On: 03/26/2015 12:59    Cardiac Studies:  ECG:  SR rate 89 normal    Telemetry:  NSR 03/27/2015   Echo: pending  2015 EF 60-65% with only grade one diastolic dysfunction   Medications:   . benazepril  40 mg Oral Daily  . budesonide  0.25 mg Nebulization BID  .  ceFAZolin (ANCEF) IV  2 g Intravenous Q12H  . docusate sodium  100 mg Oral Daily  . ipratropium-albuterol  3 mL Nebulization BID  . multivitamin with minerals  1 tablet Oral Daily  . omega-3 acid ethyl esters  1 g Oral Daily  . pantoprazole  40 mg Oral Daily  . potassium chloride SA  20 mEq Oral Daily  . rivaroxaban  20 mg Oral Q supper  . sodium chloride  3 mL Intravenous Q12H  . vitamin E  400 Units Oral Daily       Assessment/Plan:  Edema:  This is not diastolic CHF.  CXR is clear and BNP is normal. She has  chronic venous LE edema with likely Component of lymphedema.  Legs not warm and no acute erythema.  Continue diuresis and iv antibiotic coverage F/U echo pending Should be ready for d/c 48 hrs No further cardiac recommendations   Jenkins Rouge 03/27/2015, 9:44 AM

## 2015-03-27 NOTE — Assessment & Plan Note (Signed)
CHF exacerbation:  Findings consistent with CHF exacerbaion, volume overload, she does not look in distress but  O2 sat 85% RA EKG today who acue changes .  No clear etiology for exacerbation, discussed  with cardiology, they recommended ER d/t Low O2 sat. After d/w [facility] ER , pt and her daughter I  rec to go to [facility] ER Stasis dermatitis +/- cellulitis : per hospital team

## 2015-03-27 NOTE — Progress Notes (Signed)
VASCULAR LAB PRELIMINARY  ARTERIAL  ABI completed:ABIs are within normal limits.     RIGHT    LEFT    PRESSURE WAVEFORM  PRESSURE WAVEFORM  BRACHIAL 128 T BRACHIAL 128 T  DP   DP    AT 128 B AT 136 B  PT 123 B PT 128 B  PER   PER    GREAT TOE  NA GREAT TOE  NA    RIGHT LEFT  ABI 1.0 1.0     Arlin Savona, RVT 03/27/2015, 4:23 PM

## 2015-03-27 NOTE — Progress Notes (Addendum)
PROGRESS NOTE    Christina Martin VOH:[id number] DOB: Jun 24, 1924 DOA: 03/26/2015 PCP: [name], MD  Cardiology: Dr. Murvin Natal Urology: Dr.Dahlsteadt Hematology: Dr. Marin Olp.   HPI/Brief narrative 81 year old female patient with history of GERD, HTN, chronic kidney disease, left leg DVT on Xarelto, diastolic dysfunction/? Chronic diastolic heart failure, atonic bladder, chronic suprapubic catheter (follows with Dr.Dahlsteadt), sent from PCP's office & admitted to [facility] on 03/26/15 with progressive dyspnea, weight gain (694>854), worsening leg edema & hypoxia at 85%. She was admitted for presumed acute on chronic diastolic CHF, acute respiratory failure with hypoxia and presumed leg cellulitis. Cardiology consulting.  Assessment/Plan:  Lower extremity edema/? Acute on chronic diastolic CHF - Being diuresed with IV Lasix. -1.5 L since admission. Weight down from 262 > 257 pounds since admission - As per cardiology follow-up 11/5, this is not diastolic CHF (chest x-ray clear & BMP normal). However patient was hypoxic on admission. - Chronic lower extremity venous edema and lymphedema. Faint patchy erythema without increased warmth or tenderness-seem to have improved compared to admission (were warm and weeping). - For now will continue IV antibiotics but consider stopping or switching to oral antibiotics soon.  Acute hypoxic respiratory failure - ? Secondary to decompensated CHF versus OHS/? OSA. - Wean oxygen as tolerated.  Acute on stage III chronic kidney disease - Baseline creatinine in the 1.4 range. Admitted with creatinine of 1.8. Follow BMP. - Continue benazepril for now. May have to hold if creatinine is increasing.  Left lower extremity DVT - Continue Xarelto, but may have to change if creatinine worsening.  Essential hypertension - Controlled. Continue benazepril  GERD - PPI.  Atonic bladder, s/p chronic SP catheter - Catheter was last changed 3 weeks ago and has  an appointment with urology to change next week. Follows with urology.  Anemia - Stable    DVT prophylaxis: on Xarelto Code Status: Partial Family Communication: Discussed with Christina Martin on 03/27/2015. Updated care in detail and answered questions. Disposition Plan: DC home, possibly in 48 hours.   Consultants:  Cardiology  Procedures:  None  Antibiotics:  IV ancef 11/4>   Subjective: Feels better. No dyspnea or chest pain.  Objective: Filed Vitals:   03/27/15 0036 03/27/15 0500 03/27/15 0748 03/27/15 0802  BP: 119/43 124/48 100/38   Pulse: 87 91 90   Temp: 98.1 F (36.7 C) 98.2 F (36.8 C) 98.5 F (36.9 C)   TempSrc: Oral Oral Oral   Resp: 18 20 20    Height:      Weight:  116.847 kg (257 lb 9.6 oz)    SpO2: 100% 94% 91% 94%    Intake/Output Summary (Last 24 hours) at 03/27/15 1244 Last data filed at 03/27/15 1021  Gross per 24 hour  Intake    630 ml  Output   2127 ml  Net  -1497 ml   Filed Weights   03/26/15 1157 03/26/15 1553 03/27/15 0500  Weight: 118.842 kg (262 lb) 118.071 kg (260 lb 4.8 oz) 116.847 kg (257 lb 9.6 oz)     Exam:  General exam: Pleasant elderly female sitting up comfortably in chair this morning.  Respiratory system: Clear. No increased work of breathing. Cardiovascular system: S1 & S2 heard, RRR. No JVD, murmurs, gallops, clicks. 2+ pitting bilateral leg edema-appears chronic, patchy faint erythema without tenderness or increased warmth and no weeping at this time.  Gastrointestinal system: Abdomen is nondistended, soft and nontender. Normal bowel sounds heard. Suprapubic catheter intact.  Central nervous system: Alert and oriented.  No focal neurological deficits. Extremities: Symmetric 5 x 5 power.   Data Reviewed: Basic Metabolic Panel:  Recent Labs Lab 03/26/15 1212  NA 136  K 4.8  CL 99*  CO2 28  GLUCOSE 115*  BUN 35*  CREATININE 1.80*  CALCIUM 9.4   Liver Function Tests:  Recent Labs Lab 03/26/15 1212  AST  23  ALT 16  ALKPHOS 91  BILITOT 1.1  PROT 6.6  ALBUMIN 3.0*   No results for input(s): LIPASE, AMYLASE in the last 168 hours. No results for input(s): AMMONIA in the last 168 hours. CBC:  Recent Labs Lab 03/26/15 1212 03/27/15 0416  WBC 7.5 6.8  HGB 10.8* 10.5*  HCT 33.3* 32.5*  MCV 90.0 89.3  PLT 251 253   Cardiac Enzymes: No results for input(s): CKTOTAL, CKMB, CKMBINDEX, TROPONINI in the last 168 hours. BNP (last 3 results) No results for input(s): PROBNP in the last 8760 hours. CBG: No results for input(s): GLUCAP in the last 168 hours.  Recent Results (from the past 240 hour(s))  Blood culture (routine x 2)     Status: None (Preliminary result)   Collection Time: 03/26/15  6:46 PM  Result Value Ref Range Status   Specimen Description BLOOD RIGHT ANTECUBITAL  Final   Special Requests BOTTLES DRAWN AEROBIC AND ANAEROBIC 10CC  Final   Culture NO GROWTH < 24 HOURS  Final   Report Status PENDING  Incomplete  Blood culture (routine x 2)     Status: None (Preliminary result)   Collection Time: 03/26/15  6:56 PM  Result Value Ref Range Status   Specimen Description BLOOD RIGHT HAND  Final   Special Requests IN PEDIATRIC BOTTLE 3CC  Final   Culture NO GROWTH < 24 HOURS  Final   Report Status PENDING  Incomplete         Studies: Dg Chest 2 View  03/26/2015  CLINICAL DATA:  Shortness of breath with lower extremity edema for 2 weeks EXAM: CHEST  2 VIEW COMPARISON:  January 19, 2015 FINDINGS: There is no edema or consolidation. Heart is upper normal in size with pulmonary vascularity within normal limits. No adenopathy. There is atherosclerotic calcification in aorta. Bones are osteoporotic. There is postoperative change in the right shoulder. IMPRESSION: No edema or consolidation. Electronically Signed   By: Lowella Grip III M.D.   On: 03/26/2015 12:59        Scheduled Meds: . benazepril  40 mg Oral Daily  . budesonide  0.25 mg Nebulization BID  .  ceFAZolin  (ANCEF) IV  2 g Intravenous Q12H  . docusate sodium  100 mg Oral Daily  . ipratropium-albuterol  3 mL Nebulization BID  . multivitamin with minerals  1 tablet Oral Daily  . omega-3 acid ethyl esters  1 g Oral Daily  . pantoprazole  40 mg Oral Daily  . potassium chloride SA  20 mEq Oral Daily  . rivaroxaban  20 mg Oral Q supper  . sodium chloride  3 mL Intravenous Q12H  . vitamin E  400 Units Oral Daily   Continuous Infusions:   Principal Problem:   Acute on chronic diastolic (congestive) heart failure (HCC) Active Problems:   HTN (hypertension)   GERD   Chronic constipation   ATONIC BLADDER-- has a permananet suprapubic catheter    Severe obesity (BMI >= 40) (HCC)   Left leg DVT  08-2014   Dyspnea   Edema of both legs   Anticoagulated-Xarelto   Suprapubic catheter ([place])  Acute on chronic renal failure (HCC)   Cellulitis    Time spent: 35 minutes.    Vernell Leep, MD, FACP, FHM. [facility] Pager [phone]  If 7PM-7AM, please contact night-coverage [email or web address] Password [email or web address] 03/27/2015, 12:44 PM

## 2015-03-28 ENCOUNTER — Inpatient Hospital Stay (HOSPITAL_COMMUNITY): Payer: Medicare Other

## 2015-03-28 DIAGNOSIS — R0600 Dyspnea, unspecified: Secondary | ICD-10-CM

## 2015-03-28 LAB — BASIC METABOLIC PANEL
Anion gap: 9 (ref 5–15)
BUN: 34 mg/dL — ABNORMAL HIGH (ref 6–20)
CALCIUM: 8.8 mg/dL — AB (ref 8.9–10.3)
CO2: 29 mmol/L (ref 22–32)
CREATININE: 1.82 mg/dL — AB (ref 0.44–1.00)
Chloride: 100 mmol/L — ABNORMAL LOW (ref 101–111)
GFR, EST AFRICAN AMERICAN: 27 mL/min — AB (ref >60)
GFR, EST NON AFRICAN AMERICAN: 24 mL/min — AB (ref >60)
Glucose, Bld: 101 mg/dL — ABNORMAL HIGH (ref 65–99)
Potassium: 4.5 mmol/L (ref 3.5–5.1)
SODIUM: 138 mmol/L (ref 135–145)

## 2015-03-28 MED ORDER — DOXYCYCLINE HYCLATE 100 MG PO TABS
100.0000 mg | ORAL_TABLET | Freq: Two times a day (BID) | ORAL | Status: DC
  Administered 2015-03-28 – 2015-03-29 (×3): 100 mg via ORAL
  Filled 2015-03-28 (×3): qty 1

## 2015-03-28 MED ORDER — FUROSEMIDE 10 MG/ML IJ SOLN
80.0000 mg | Freq: Once | INTRAMUSCULAR | Status: AC
  Administered 2015-03-28: 80 mg via INTRAVENOUS
  Filled 2015-03-28: qty 8

## 2015-03-28 MED ORDER — FUROSEMIDE 40 MG PO TABS
40.0000 mg | ORAL_TABLET | Freq: Two times a day (BID) | ORAL | Status: DC
  Administered 2015-03-28 – 2015-03-29 (×2): 40 mg via ORAL
  Filled 2015-03-28 (×2): qty 1

## 2015-03-28 NOTE — Progress Notes (Signed)
PROGRESS NOTE    Christina Martin GEX:[id number] DOB: 02-Feb-1925 DOA: 03/26/2015 PCP: [name], MD  Cardiology: Dr. Murvin Natal Urology: Dr.Dahlsteadt Hematology: Dr. Marin Olp.   HPI/Brief narrative 81 year old female patient with history of GERD, HTN, chronic kidney disease, left leg DVT on Xarelto, diastolic dysfunction/? Chronic diastolic heart failure, atonic bladder, chronic suprapubic catheter (follows with Dr.Dahlsteadt), sent from PCP's office & admitted to [facility] on 03/26/15 with progressive dyspnea, weight gain (010>272), worsening leg edema & hypoxia at 85%. She was admitted for presumed acute on chronic diastolic CHF, acute respiratory failure with hypoxia and presumed leg cellulitis. Cardiology consulting. Improving. Possible discharge 11/7.  Assessment/Plan:  Lower extremity edema/? Acute on chronic diastolic CHF - Being diuresed with IV Lasix. -2.3 L since admission. Weight down from 262 > 257 pounds since admission - As per cardiology follow-up, this is not diastolic CHF (chest x-ray clear & BMP normal). However patient was hypoxic on admission - even this is not definite (daughter stated that the pulse oximetry monitor was not fitting adequately on her finger in PCPs office). - Chronic lower extremity venous edema and lymphedema. Faint patchy erythema without increased warmth or tenderness- improved compared to admission (were warm and weeping). - After dose of IV Lasix today, transitioning to oral Lasix. DC IV antibiotics. Start doxycycline PO to complete total one-week course.  Acute hypoxic respiratory failure - ? Secondary to decompensated CHF versus OHS/? OSA. - Wean oxygen as tolerated. - Seems to have resolved. May have an element of COPD (former smoker). Recommend outpatient evaluation i.e. PFTs and sleep study as deemed necessary.  Acute on stage III chronic kidney disease - Baseline creatinine in the 1.4 range. Admitted with creatinine of 1.8.  - Continue  benazepril for now. May have to hold if creatinine is increasing.  - Creatinine stable in the 1.8 range and may have to accept higher stable creatinine while on diuretics.  Left lower extremity DVT - Continue Xarelto, but may have to change if creatinine worsening.  Essential hypertension - Controlled. Continue benazepril  GERD - PPI.  Atonic bladder, s/p chronic SP catheter - Catheter was last changed 3 weeks ago and has an appointment with urology to change next week. Follows with urology.  Anemia - Stable    DVT prophylaxis: on Xarelto Code Status: Partial Family Communication: Discussed with daughter on 03/27/2015. Updated care in detail and answered questions. Disposition Plan: DC home, possibly 11/7.   Consultants:  Cardiology  Procedures:  None  Antibiotics:  IV ancef 11/4>   Subjective: Denies dyspnea or chest pain. Leg swelling, redness and pain have improved. Chronically has some patchy redness and swelling of her legs.  Objective: Filed Vitals:   03/27/15 2040 03/28/15 0530 03/28/15 0932 03/28/15 0938  BP:  116/47    Pulse:  88    Temp:  98.3 F (36.8 C)    TempSrc:  Oral    Resp:  20    Height:      Weight:  116.665 kg (257 lb 3.2 oz)    SpO2: 92% 92% 92% 93%    Intake/Output Summary (Last 24 hours) at 03/28/15 1103 Last data filed at 03/28/15 1000  Gross per 24 hour  Intake    700 ml  Output   1550 ml  Net   -850 ml   Filed Weights   03/26/15 1553 03/27/15 0500 03/28/15 0530  Weight: 118.071 kg (260 lb 4.8 oz) 116.847 kg (257 lb 9.6 oz) 116.665 kg (257 lb 3.2 oz)  Exam:  General exam: Pleasant elderly female sitting up comfortably in bed this morning.  Respiratory system: Clear. No increased work of breathing. Cardiovascular system: S1 & S2 heard, RRR. No JVD, murmurs, gallops, clicks. 2+ pitting bilateral leg edema-appears chronic, patchy faint erythema without tenderness or increased warmth and no weeping at this time. Telemetry:  Sinus rhythm. Gastrointestinal system: Abdomen is nondistended, soft and nontender. Normal bowel sounds heard. Suprapubic catheter intact.  Central nervous system: Alert and oriented. No focal neurological deficits. Extremities: Symmetric 5 x 5 power.   Data Reviewed: Basic Metabolic Panel:  Recent Labs Lab 03/26/15 1212 03/27/15 1414 03/28/15 0152  NA 136 137 138  K 4.8 5.0 4.5  CL 99* 102 100*  CO2 28 26 29   GLUCOSE 115* 128* 101*  BUN 35* 35* 34*  CREATININE 1.80* 1.83* 1.82*  CALCIUM 9.4 9.1 8.8*   Liver Function Tests:  Recent Labs Lab 03/26/15 1212  AST 23  ALT 16  ALKPHOS 91  BILITOT 1.1  PROT 6.6  ALBUMIN 3.0*   No results for input(s): LIPASE, AMYLASE in the last 168 hours. No results for input(s): AMMONIA in the last 168 hours. CBC:  Recent Labs Lab 03/26/15 1212 03/27/15 0416  WBC 7.5 6.8  HGB 10.8* 10.5*  HCT 33.3* 32.5*  MCV 90.0 89.3  PLT 251 253   Cardiac Enzymes: No results for input(s): CKTOTAL, CKMB, CKMBINDEX, TROPONINI in the last 168 hours. BNP (last 3 results) No results for input(s): PROBNP in the last 8760 hours. CBG: No results for input(s): GLUCAP in the last 168 hours.  Recent Results (from the past 240 hour(s))  Blood culture (routine x 2)     Status: None (Preliminary result)   Collection Time: 03/26/15  6:46 PM  Result Value Ref Range Status   Specimen Description BLOOD RIGHT ANTECUBITAL  Final   Special Requests BOTTLES DRAWN AEROBIC AND ANAEROBIC 10CC  Final   Culture NO GROWTH 2 DAYS  Final   Report Status PENDING  Incomplete  Blood culture (routine x 2)     Status: None (Preliminary result)   Collection Time: 03/26/15  6:56 PM  Result Value Ref Range Status   Specimen Description BLOOD RIGHT HAND  Final   Special Requests IN PEDIATRIC BOTTLE 3CC  Final   Culture NO GROWTH 2 DAYS  Final   Report Status PENDING  Incomplete         Studies: Dg Chest 2 View  03/26/2015  CLINICAL DATA:  Shortness of breath  with lower extremity edema for 2 weeks EXAM: CHEST  2 VIEW COMPARISON:  January 19, 2015 FINDINGS: There is no edema or consolidation. Heart is upper normal in size with pulmonary vascularity within normal limits. No adenopathy. There is atherosclerotic calcification in aorta. Bones are osteoporotic. There is postoperative change in the right shoulder. IMPRESSION: No edema or consolidation. Electronically Signed   By: Lowella Grip III M.D.   On: 03/26/2015 12:59        Scheduled Meds: . benazepril  40 mg Oral Daily  . budesonide  0.25 mg Nebulization BID  . docusate sodium  100 mg Oral Daily  . doxycycline  100 mg Oral BID  . furosemide  40 mg Oral BID  . ipratropium-albuterol  3 mL Nebulization BID  . multivitamin with minerals  1 tablet Oral Daily  . omega-3 acid ethyl esters  1 g Oral Daily  . pantoprazole  40 mg Oral Daily  . potassium chloride SA  20 mEq Oral  Daily  . rivaroxaban  20 mg Oral Q supper  . sodium chloride  3 mL Intravenous Q12H  . vitamin E  400 Units Oral Daily   Continuous Infusions:   Principal Problem:   Acute on chronic diastolic (congestive) heart failure (HCC) Active Problems:   HTN (hypertension)   GERD   Chronic constipation   ATONIC BLADDER-- has a permananet suprapubic catheter    Severe obesity (BMI >= 40) (HCC)   Left leg DVT  08-2014   Dyspnea   Edema of both legs   Anticoagulated-Xarelto   Suprapubic catheter (HCC)   Acute on chronic renal failure (HCC)   Cellulitis   Bilateral lower leg cellulitis    Time spent: 15 minutes.    Vernell Leep, MD, FACP, FHM. [facility] Pager [phone]  If 7PM-7AM, please contact night-coverage [email or web address] Password [email or web address] 03/28/2015, 11:03 AM    LOS: 1 day

## 2015-03-28 NOTE — Progress Notes (Signed)
  Echocardiogram 2D Echocardiogram has been performed.  Christina Martin 03/28/2015, 8:52 AM

## 2015-03-28 NOTE — Progress Notes (Signed)
Patient ID: Christina Martin, female   DOB: 03-Jan-1925, 81 y.o.   MRN: [id number]    Subjective:  Denies SSCP, palpitations or Dyspnea Sitting in chair   Legs at baseline   Objective:  Filed Vitals:   03/27/15 1300 03/27/15 1958 03/27/15 2040 03/28/15 0530  BP: 124/45 107/45  116/47  Pulse: 89 81  88  Temp: 98.2 F (36.8 C) 98.5 F (36.9 C)  98.3 F (36.8 C)  TempSrc: Oral Oral  Oral  Resp: 20 20  20   Height:      Weight:    116.665 kg (257 lb 3.2 oz)  SpO2: 93% 93% 92% 92%    Intake/Output from previous day:  Intake/Output Summary (Last 24 hours) at 03/28/15 0853 Last data filed at 03/28/15 0844  Gross per 24 hour  Intake    700 ml  Output   1451 ml  Net   -751 ml    Physical Exam: Affect appropriate Chronically ill white female  HEENT: normal Neck supple with no adenopathy JVP normal no bruits no thyromegaly Lungs clear with no wheezing and good diaphragmatic motion Heart:  S1/S2 SEM  murmur, no rub, gallop or click PMI normal Abdomen: benighn, BS positve, no tenderness, no AAA no bruit.  No HSM or HJR Distal pulses intact with no bruits Plus 3 bilateral LE  Edema with erythema no warmth  Neuro non-focal  No muscular weakness   Lab Results: Basic Metabolic Panel:  Recent Labs  03/27/15 1414 03/28/15 0152  NA 137 138  K 5.0 4.5  CL 102 100*  CO2 26 29  GLUCOSE 128* 101*  BUN 35* 34*  CREATININE 1.83* 1.82*  CALCIUM 9.1 8.8*   Liver Function Tests:  Recent Labs  03/26/15 1212  AST 23  ALT 16  ALKPHOS 91  BILITOT 1.1  PROT 6.6  ALBUMIN 3.0*   CBC:  Recent Labs  03/26/15 1212 03/27/15 0416  WBC 7.5 6.8  HGB 10.8* 10.5*  HCT 33.3* 32.5*  MCV 90.0 89.3  PLT 251 253    Imaging: Dg Chest 2 View  03/26/2015  CLINICAL DATA:  Shortness of breath with lower extremity edema for 2 weeks EXAM: CHEST  2 VIEW COMPARISON:  January 19, 2015 FINDINGS: There is no edema or consolidation. Heart is upper normal in size with pulmonary vascularity  within normal limits. No adenopathy. There is atherosclerotic calcification in aorta. Bones are osteoporotic. There is postoperative change in the right shoulder. IMPRESSION: No edema or consolidation. Electronically Signed   By: Lowella Grip III M.D.   On: 03/26/2015 12:59    Cardiac Studies:  ECG:  SR rate 89 normal    Telemetry:  NSR 03/28/2015   Echo: pending  2015 EF 60-65% with only grade one diastolic dysfunction   Medications:   . benazepril  40 mg Oral Daily  . budesonide  0.25 mg Nebulization BID  .  ceFAZolin (ANCEF) IV  2 g Intravenous Q12H  . docusate sodium  100 mg Oral Daily  . ipratropium-albuterol  3 mL Nebulization BID  . multivitamin with minerals  1 tablet Oral Daily  . omega-3 acid ethyl esters  1 g Oral Daily  . pantoprazole  40 mg Oral Daily  . potassium chloride SA  20 mEq Oral Daily  . rivaroxaban  20 mg Oral Q supper  . sodium chloride  3 mL Intravenous Q12H  . vitamin E  400 Units Oral Daily       Assessment/Plan:  Edema:  This is not diastolic CHF.  CXR is clear and BNP is normal. She has chronic venous LE edema with likely Component of lymphedema.  Legs not warm and no acute erythema.  Continue diuresis  CR 1.8  Will write for 40 po bid Cr may bump to 2.0 range to make impact on edema.  Change to PO antibiotics per IM   Jenkins Rouge 03/28/2015, 8:53 AM

## 2015-03-29 DIAGNOSIS — Z7901 Long term (current) use of anticoagulants: Secondary | ICD-10-CM

## 2015-03-29 LAB — BASIC METABOLIC PANEL
ANION GAP: 8 (ref 5–15)
BUN: 34 mg/dL — ABNORMAL HIGH (ref 6–20)
CALCIUM: 9 mg/dL (ref 8.9–10.3)
CHLORIDE: 100 mmol/L — AB (ref 101–111)
CO2: 30 mmol/L (ref 22–32)
Creatinine, Ser: 1.75 mg/dL — ABNORMAL HIGH (ref 0.44–1.00)
GFR calc non Af Amer: 25 mL/min — ABNORMAL LOW (ref >60)
GFR, EST AFRICAN AMERICAN: 29 mL/min — AB (ref >60)
GLUCOSE: 111 mg/dL — AB (ref 65–99)
Potassium: 4.6 mmol/L (ref 3.5–5.1)
Sodium: 138 mmol/L (ref 135–145)

## 2015-03-29 MED ORDER — DOXYCYCLINE HYCLATE 100 MG PO TABS: 100.0000 mg | ORAL_TABLET | Freq: Two times a day (BID) | ORAL | Status: DC

## 2015-03-29 NOTE — Care Management Note (Signed)
Case Management Note  Patient Details  Name: Christina Martin MRN: [id number] Date of Birth: 08/14/1924  Subjective/Objective:     Admitted with CHF               Action/Plan: Patient lives alone, daughter is supportive and she is planning to stay with the patient at her home in a few days. [place] choices offered, patient chose [place] for [facility] [place]. Tiffany with [facility] called for arrangements. No DME needed, she has a walker at home. Attending MD, at discharge please enter the face to face Medicare document for [facility] services in epic.  Expected Discharge Date:  03/30/15               Expected Discharge Plan:  [place]  Discharge planning Services  CM Consult  Choice offered to:  Patient  HH Arranged:  RN, PT [facility] Agency:  [place]  Status of Service:  In process, will continue to follow  Sherrilyn Rist [phone] 03/29/2015, 10:32 AM

## 2015-03-29 NOTE — Progress Notes (Signed)
Text paged Dr. Algis Liming regarding c/o right leg pain which is new. Told him that leg is red and hot. Good dorsalis pedis pulse, however, the post tibial pulse is weaker. Has 1+ pitting edema. No orders given except to discharge patient as planned.

## 2015-03-29 NOTE — Discharge Summary (Signed)
Physician Discharge Summary  Christina Martin:[id number] DOB: 1925/05/01 DOA: 03/26/2015  PCP: [name], MD  Cardiology: Dr. Murvin Natal Urology: Dr.Dahlsteadt Hematology: Dr. Marin Olp.  Admit date: 03/26/2015 Discharge date: 03/29/2015  Time spent: Less than 30 minutes  Recommendations for Outpatient Follow-up:  1. Dr. [name], PCP in one week with repeat labs (CBC & BMP). Please follow final blood culture results that were sent from the hospital. 2. Home health physical therapy. Patient states that her daughter is going to stay with her for a couple of days for assistance.  Discharge Diagnoses:  Principal Problem:   Acute on chronic diastolic (congestive) heart failure (HCC) Active Problems:   HTN (hypertension)   GERD   Chronic constipation   ATONIC BLADDER-- has a permananet suprapubic catheter    Severe obesity (BMI >= 40) (HCC)   Left leg DVT  08-2014   Dyspnea   Edema of both legs   Anticoagulated-Xarelto   Suprapubic catheter (HCC)   Acute on chronic renal failure (HCC)   Cellulitis   Bilateral lower leg cellulitis   Discharge Condition: Improved & Stable  Diet recommendation: Heart healthy diet.  Filed Weights   03/27/15 0500 03/28/15 0530 03/29/15 0524  Weight: 116.847 kg (257 lb 9.6 oz) 116.665 kg (257 lb 3.2 oz) 116.711 kg (257 lb 4.8 oz)    History of present illness:  81 year old female patient with history of GERD, HTN, chronic kidney disease, left leg DVT on Xarelto, diastolic dysfunction/? Chronic diastolic heart failure, atonic bladder, chronic suprapubic catheter (follows with Dr.Dahlsteadt), sent from PCP's office & [facility] with progressive dyspnea, weight gain (948>546), worsening leg edema & hypoxia at 85%. She was admitted for presumed acute on chronic diastolic CHF, acute respiratory failure with hypoxia and presumed leg cellulitis. Cardiology consulting. Improving.   Hospital Course:   Decompensated Lower extremity  edema complicated by cellulitis - Initially diuresed with IV Lasix. - 3.157 L since admission. Weight down from 262 > 257 pounds since admission - As per cardiology follow-up, this is not diastolic CHF (chest x-ray clear & BMP normal). However patient was hypoxic on admission - even this is not definite (daughter stated that the pulse oximetry monitor was not fitting adequately on her finger in PCPs office). - Likely had worsening of her leg edema related to chronic lower extremity venous edema and lymphedema complicated by cellulitis.  - Clinically improved. Diuretics transitioned back to home dose of oral Lasix and changed to oral antibiotics (was on IV Ancef initially) and complete total one-week course. Patient unable to use hospital compression stockings due to inappropriate size. Does use Ace wrap at home-continue.  Acute hypoxic respiratory failure - ? Secondary to decompensated CHF versus OHS/? OSA.? Not an accurate reading at PCPs office. - resolved. May have an element of COPD (former smoker). Recommend outpatient evaluation i.e. PFTs and sleep study as deemed necessary.  Acute on stage III chronic kidney disease - Baseline creatinine in the 1.4 range. Admitted with creatinine of 1.8.  - Continue benazepril for now. May have to hold if creatinine is increasing.  - Creatinine stable in the 1.8 range and may have to accept higher stable creatinine while on diuretics. -Follow-up BMP in a few days as outpatient.   Left lower extremity DVT - Continue Xarelto, but may have to change if creatinine worsening. Discussed with pharmacy today who recommended continuing current dose.  Essential hypertension - Controlled. Continue benazepril  GERD - PPI.  Atonic bladder, s/p chronic  SP catheter - Catheter was last changed 3 weeks ago and has an appointment with urology to change next week. Follows with urology.Has appointment with urology on 11/8 to have catheter changed.  Anemia -  Stable  Consultations:  Cardiology  Procedures:  2 D Echo 03/28/2015: Study Conclusions  - Left ventricle: The cavity size was normal. Wall thickness was normal. Systolic function was vigorous. The estimated ejection fraction was in the range of 65% to 70%. Wall motion was normal; there were no regional wall motion abnormalities. Doppler parameters are consistent with abnormal left ventricular relaxation (grade 1 diastolic dysfunction). Doppler parameters are consistent with high ventricular filling pressure. - Aortic valve: Trileaflet; mildly thickened leaflets. - Mitral valve: Mildly calcified annulus. There was mild regurgitation. - Left atrium: The atrium was moderately dilated. Volume/bsa, ES (1-plane Simpson&'s, A4C): 37.5 ml/m^2. - Right ventricle: Prominent moderator band. - Right atrium: The atrium was mildly dilated. - Tricuspid valve: There was mild regurgitation. - Pulmonary arteries: Systolic pressure was moderately increased. PA peak pressure: 53 mm Hg (S). - Systemic veins: IVC dilated with normal respiratory variation. Estimated CVP 8 mmHg.   Discharge Exam:  Complaints: Continues to feel better. Leg swelling, redness, pain have all significantly improved. Denies chest pain or dyspnea.  Filed Vitals:   03/28/15 1949 03/28/15 2141 03/29/15 0524 03/29/15 0900  BP: 129/45  137/69   Pulse: 89 94 84   Temp: 98.9 F (37.2 C)  98.7 F (37.1 C)   TempSrc: Oral  Oral   Resp:  20 21   Height:      Weight:   116.711 kg (257 lb 4.8 oz)   SpO2: 91%  93% 95%    General exam: Pleasant elderly female sitting up comfortably in bed eating breakfast  this morning.  Respiratory system: Clear. No increased work of breathing. Cardiovascular system: S1 & S2 heard, RRR. No JVD, murmurs, gallops, clicks. 1+ pitting bilateral leg edema/chronic -appears chronic, patchy faint erythema without tenderness or increased warmth and no weeping at this time.  Gastrointestinal system: Abdomen is nondistended, soft and nontender. Normal bowel sounds heard. Suprapubic catheter intact.  Central nervous system: Alert and oriented. No focal neurological deficits. Extremities: Symmetric 5 x 5 power.  Discharge Instructions      Discharge Instructions    (HEART FAILURE PATIENTS) Call MD:  Anytime you have any of the following symptoms: 1) 3 pound weight gain in 24 hours or 5 pounds in 1 week 2) shortness of breath, with or without a dry hacking cough 3) swelling in the hands, feet or stomach 4) if you have to sleep on extra pillows at night in order to breathe.    Complete by:  As directed      Call MD for:  difficulty breathing, headache or visual disturbances    Complete by:  As directed      Call MD for:  extreme fatigue    Complete by:  As directed      Call MD for:  hives    Complete by:  As directed      Call MD for:  persistant dizziness or light-headedness    Complete by:  As directed      Call MD for:  persistant nausea and vomiting    Complete by:  As directed      Call MD for:  redness, tenderness, or signs of infection (pain, swelling, redness, odor or green/yellow discharge around incision site)    Complete by:  As directed  Call MD for:  severe uncontrolled pain    Complete by:  As directed      Call MD for:  temperature >100.4    Complete by:  As directed      Diet - low sodium heart healthy    Complete by:  As directed      Increase activity slowly    Complete by:  As directed             Medication List    TAKE these medications        acetaminophen 325 MG tablet  Commonly known as:  TYLENOL  Take 650 mg by mouth every 6 (six) hours as needed (pain).     benazepril 40 MG tablet  Commonly known as:  LOTENSIN  Take 1 tablet (40 mg total) by mouth daily.     CALCIUM 600 + D PO  Take 2 capsules by mouth daily. \     CITRUCEL PO  Take 2 tablets by mouth daily.     doxycycline 100 MG tablet  Commonly known as:   VIBRA-TABS  Take 1 tablet (100 mg total) by mouth 2 (two) times daily.     Fish Oil 1200 MG Caps  Take 1 capsule by mouth daily.     furosemide 40 MG tablet  Commonly known as:  LASIX  Take 40 mg by mouth 2 (two) times daily.     KLOR-CON M20 20 MEQ tablet  Generic drug:  potassium chloride SA  TAKE 1 TABLET BY MOUTH DAILY     lactulose 10 GM/15ML solution  Commonly known as:  CHRONULAC  Take 15 mLs (10 g total) by mouth daily as needed for mild constipation.     multivitamin tablet  Take 1 tablet by mouth daily.     omeprazole 20 MG capsule  Commonly known as:  PRILOSEC  Take 20 mg by mouth daily. Take only on Sun, Tues,Thurs, Sat     STOOL SOFTENER PO  Take 1 capsule by mouth daily.     traMADol 50 MG tablet  Commonly known as:  ULTRAM  Take 1 tablet (50 mg total) by mouth 3 (three) times daily as needed.     vitamin E 400 UNIT capsule  Take 400 Units by mouth daily.     XARELTO 20 MG Tabs tablet  Generic drug:  rivaroxaban  TAKE 1 TABLET (20 MG TOTAL) BY MOUTH DAILY WITH SUPPER.       Follow-up Information    Follow up with [facility].   Why:  They will do your home health care at your home   Contact information:   [address] [place] [address] [phone]       Follow up with [name], MD. Schedule an appointment as soon as possible for a visit in 1 week.   Specialty:  Internal Medicine   Why:  To be seen with repeat labs (CBc & BMP).   Contact information:   [place] [address] [phone]        The results of significant diagnostics from this hospitalization (including imaging, microbiology, ancillary and laboratory) are listed below for reference.    Significant Diagnostic Studies: Dg Chest 2 View  03/26/2015  CLINICAL DATA:  Shortness of breath with lower extremity edema for 2 weeks EXAM: CHEST  2 VIEW COMPARISON:  January 19, 2015 FINDINGS: There is no edema or consolidation.  Heart is upper normal in  size with pulmonary vascularity within normal limits. No adenopathy. There is atherosclerotic calcification in aorta. Bones are osteoporotic. There is postoperative change in the right shoulder. IMPRESSION: No edema or consolidation. Electronically Signed   By: Lowella Grip III M.D.   On: 03/26/2015 12:59    Microbiology: Recent Results (from the past 240 hour(s))  Blood culture (routine x 2)     Status: None (Preliminary result)   Collection Time: 03/26/15  6:46 PM  Result Value Ref Range Status   Specimen Description BLOOD RIGHT ANTECUBITAL  Final   Special Requests BOTTLES DRAWN AEROBIC AND ANAEROBIC 10CC  Final   Culture NO GROWTH 2 DAYS  Final   Report Status PENDING  Incomplete  Blood culture (routine x 2)     Status: None (Preliminary result)   Collection Time: 03/26/15  6:56 PM  Result Value Ref Range Status   Specimen Description BLOOD RIGHT HAND  Final   Special Requests IN PEDIATRIC BOTTLE 3CC  Final   Culture NO GROWTH 2 DAYS  Final   Report Status PENDING  Incomplete     Labs: Basic Metabolic Panel:  Recent Labs Lab 03/26/15 1212 03/27/15 1414 03/28/15 0152 03/29/15 0338  NA 136 137 138 138  K 4.8 5.0 4.5 4.6  CL 99* 102 100* 100*  CO2 28 26 29 30   GLUCOSE 115* 128* 101* 111*  BUN 35* 35* 34* 34*  CREATININE 1.80* 1.83* 1.82* 1.75*  CALCIUM 9.4 9.1 8.8* 9.0   Liver Function Tests:  Recent Labs Lab 03/26/15 1212  AST 23  ALT 16  ALKPHOS 91  BILITOT 1.1  PROT 6.6  ALBUMIN 3.0*   No results for input(s): LIPASE, AMYLASE in the last 168 hours. No results for input(s): AMMONIA in the last 168 hours. CBC:  Recent Labs Lab 03/26/15 1212 03/27/15 0416  WBC 7.5 6.8  HGB 10.8* 10.5*  HCT 33.3* 32.5*  MCV 90.0 89.3  PLT 251 253   Cardiac Enzymes: No results for input(s): CKTOTAL, CKMB, CKMBINDEX, TROPONINI in the last 168 hours. BNP: BNP (last 3 results)  Recent Labs  03/26/15 1212  BNP 89.7    ProBNP (last 3  results) No results for input(s): PROBNP in the last 8760 hours.  CBG: No results for input(s): GLUCAP in the last 168 hours.      Signed:  Vernell Leep, MD, FACP, FHM. [facility] Pager [phone]  If 7PM-7AM, please contact night-coverage [email or web address] Password [email or web address] 03/29/2015, 11:05 AM

## 2015-03-29 NOTE — Discharge Instructions (Signed)

## 2015-03-29 NOTE — Evaluation (Signed)
Physical Therapy Evaluation Patient Details Name: Christina Martin MRN: [id number] DOB: 20-Feb-1925 Today's Date: 03/29/2015   History of Present Illness  81 y.o. female who presented to the ED with hypoxia, acute respiratory failure, dyspnea and weight gain. She was admitted with dx of acute on chronic diastolic CHF. PMH consists of PVD, CHF, and HTN.  Clinical Impression  Pt admitted with above diagnosis. Pt currently with functional limitations due to the deficits listed below (see PT Problem List). On eval, pt required min assist for all functional mobility. Gait distance limited to 40 feet due to R knee pain and DOE. Pt will benefit from skilled PT to increase their independence and safety with mobility to allow discharge to the venue listed below.  Pt has RW for home use. Pt reports daughter will be staying with her upon d/c from the hospital.     Follow Up Recommendations Home health PT;Supervision/Assistance - 24 hour    Equipment Recommendations  None recommended by PT    Recommendations for Other Services       Precautions / Restrictions Precautions Precautions: Fall      Mobility  Bed Mobility Overal bed mobility: Needs Assistance Bed Mobility: Supine to Sit     Supine to sit: Min assist     General bed mobility comments: assist to scoot to EOB  Transfers Overall transfer level: Needs assistance Equipment used: Rolling walker (2 wheeled) Transfers: Sit to/from [facility] Sit to Stand: Min assist Stand pivot transfers: Min assist       General transfer comment: verbal cues for hand placement. Assist to power up.  Ambulation/Gait Ambulation/Gait assistance: Min guard Ambulation Distance (Feet): 40 Feet Assistive device: Rolling walker (2 wheeled) Gait Pattern/deviations: Antalgic;Decreased stride length;Step-through pattern Gait velocity: decreased Gait velocity interpretation: Below normal speed for age/gender General Gait Details: Verbal  cues to stay close to RW. Pt with c/o R knee pain limiting gait distance. DOE noted after ambulation. O2 sats at 93% on RA.  Stairs            Wheelchair Mobility    Modified Rankin (Stroke Patients Only)       Balance                                             Pertinent Vitals/Pain Pain Assessment: Faces Faces Pain Scale: Hurts even more Pain Location: R knee with ambulation Pain Descriptors / Indicators: Sore Pain Intervention(s): Limited activity within patient's tolerance;Repositioned    Home Living Family/patient expects to be discharged to:: Private residence Living Arrangements: Alone Available Help at Discharge: Family;Neighbor;Available 24 hours/day (daughter will stay with her upon d/c from hospital) Type of Home: House Home Access: Level entry     Home Layout: One level Home Equipment: Walker - 2 wheels      Prior Function Level of Independence: Needs assistance   Gait / Transfers Assistance Needed: Mod I ambulation with RW household distances  ADL's / Homemaking Assistance Needed: Neighbor assists with ADLs in AM, including bathing dressings, meds, housekeeping and meal prep.        Hand Dominance        Extremity/Trunk Assessment               Lower Extremity Assessment: Generalized weakness         Communication   Communication: No difficulties  Cognition Arousal/Alertness: Awake/alert Behavior  During Therapy: WFL for tasks assessed/performed Overall Cognitive Status: Within Functional Limits for tasks assessed                      General Comments      Exercises        Assessment/Plan    PT Assessment Patient needs continued PT services  PT Diagnosis Difficulty walking;Generalized weakness   PT Problem List Decreased strength;Decreased activity tolerance;Decreased balance;Decreased mobility;Pain  PT Treatment Interventions Gait training;Functional mobility training;Stair  training;Therapeutic activities;Therapeutic exercise;Patient/family education;Balance training   PT Goals (Current goals can be found in the Care Plan section) Acute Rehab PT Goals Patient Stated Goal: home today PT Goal Formulation: With patient Time For Goal Achievement: 04/12/15 Potential to Achieve Goals: Good    Frequency Min 3X/week   Barriers to discharge        Co-evaluation               End of Session Equipment Utilized During Treatment: Gait belt Activity Tolerance: Patient limited by pain (R knee pain) Patient left: in chair;with call bell/phone within reach;with chair alarm set Nurse Communication: Mobility status         Time: 6203-5597 PT Time Calculation (min) (ACUTE ONLY): 34 min   Charges:   PT Evaluation $Initial PT Evaluation Tier I: 1 Procedure PT Treatments $Gait Training: 8-22 mins   PT G Codes:        Christina Martin 03/29/2015, 9:50 AM

## 2015-03-29 NOTE — Progress Notes (Signed)
IV removed per discharge order. Discharge instructions given and explained to patient with teach back. Discharged via wheelchair with volunteer services.

## 2015-03-30 ENCOUNTER — Telehealth: Payer: Self-pay | Admitting: Behavioral Health

## 2015-03-30 DIAGNOSIS — Z792 Long term (current) use of antibiotics: Secondary | ICD-10-CM | POA: Diagnosis not present

## 2015-03-30 DIAGNOSIS — L03116 Cellulitis of left lower limb: Secondary | ICD-10-CM | POA: Diagnosis not present

## 2015-03-30 DIAGNOSIS — I130 Hypertensive heart and chronic kidney disease with heart failure and stage 1 through stage 4 chronic kidney disease, or unspecified chronic kidney disease: Secondary | ICD-10-CM | POA: Diagnosis not present

## 2015-03-30 DIAGNOSIS — E669 Obesity, unspecified: Secondary | ICD-10-CM | POA: Diagnosis not present

## 2015-03-30 DIAGNOSIS — D649 Anemia, unspecified: Secondary | ICD-10-CM | POA: Diagnosis not present

## 2015-03-30 DIAGNOSIS — Z466 Encounter for fitting and adjustment of urinary device: Secondary | ICD-10-CM | POA: Diagnosis not present

## 2015-03-30 DIAGNOSIS — R7303 Prediabetes: Secondary | ICD-10-CM | POA: Diagnosis not present

## 2015-03-30 DIAGNOSIS — I739 Peripheral vascular disease, unspecified: Secondary | ICD-10-CM | POA: Diagnosis not present

## 2015-03-30 DIAGNOSIS — I5032 Chronic diastolic (congestive) heart failure: Secondary | ICD-10-CM | POA: Diagnosis not present

## 2015-03-30 DIAGNOSIS — Z8543 Personal history of malignant neoplasm of ovary: Secondary | ICD-10-CM | POA: Diagnosis not present

## 2015-03-30 DIAGNOSIS — Z86718 Personal history of other venous thrombosis and embolism: Secondary | ICD-10-CM | POA: Diagnosis not present

## 2015-03-30 DIAGNOSIS — I2510 Atherosclerotic heart disease of native coronary artery without angina pectoris: Secondary | ICD-10-CM | POA: Diagnosis not present

## 2015-03-30 DIAGNOSIS — L03115 Cellulitis of right lower limb: Secondary | ICD-10-CM | POA: Diagnosis not present

## 2015-03-30 DIAGNOSIS — Z87891 Personal history of nicotine dependence: Secondary | ICD-10-CM | POA: Diagnosis not present

## 2015-03-30 DIAGNOSIS — N183 Chronic kidney disease, stage 3 (moderate): Secondary | ICD-10-CM | POA: Diagnosis not present

## 2015-03-30 DIAGNOSIS — M1990 Unspecified osteoarthritis, unspecified site: Secondary | ICD-10-CM | POA: Diagnosis not present

## 2015-03-30 DIAGNOSIS — K219 Gastro-esophageal reflux disease without esophagitis: Secondary | ICD-10-CM | POA: Diagnosis not present

## 2015-03-30 DIAGNOSIS — N312 Flaccid neuropathic bladder, not elsewhere classified: Secondary | ICD-10-CM | POA: Diagnosis not present

## 2015-03-30 DIAGNOSIS — Z96659 Presence of unspecified artificial knee joint: Secondary | ICD-10-CM | POA: Diagnosis not present

## 2015-03-30 DIAGNOSIS — Z6841 Body Mass Index (BMI) 40.0 and over, adult: Secondary | ICD-10-CM | POA: Diagnosis not present

## 2015-03-30 NOTE — Telephone Encounter (Signed)
Transition Care Management Follow-up Telephone Call   Date discharged? 03/29/15   How have you been since you were released from the hospital? Patient voiced, "I'm coming along, but having some trouble with getting up and down."   Do you understand why you were in the hospital? yes   Do you understand the discharge instructions? yes   Where were you discharged to? Home   Items Reviewed:  Medications reviewed: yes  Allergies reviewed: yes  Dietary changes reviewed: yes, low-salt diet  Referrals reviewed: yes   Functional Questionnaire:   Activities of Daily Living (ADLs):   She states they are independent in the following: ambulation, bathing and hygiene, feeding, continence, grooming, toileting and dressing States they require assistance with the following: None   Any transportation issues/concerns?: no   Any patient concerns? no   Confirmed importance and date/time of follow-up visits scheduled yes, 04/08/15 at 11:30 AM.  Provider Appointment booked with Dr. Larose Kells.  Confirmed with patient if condition begins to worsen call PCP or go to the ER.  Patient was given the office number and encouraged to call back with question or concerns.  : yes

## 2015-03-31 ENCOUNTER — Ambulatory Visit: Payer: Medicare Other | Admitting: Family

## 2015-03-31 ENCOUNTER — Other Ambulatory Visit: Payer: Medicare Other

## 2015-03-31 ENCOUNTER — Ambulatory Visit (HOSPITAL_BASED_OUTPATIENT_CLINIC_OR_DEPARTMENT_OTHER): Payer: Medicare Other

## 2015-03-31 LAB — CULTURE, BLOOD (ROUTINE X 2)
CULTURE: NO GROWTH
Culture: NO GROWTH

## 2015-04-01 ENCOUNTER — Telehealth: Payer: Self-pay | Admitting: Internal Medicine

## 2015-04-01 DIAGNOSIS — I130 Hypertensive heart and chronic kidney disease with heart failure and stage 1 through stage 4 chronic kidney disease, or unspecified chronic kidney disease: Secondary | ICD-10-CM | POA: Diagnosis not present

## 2015-04-01 DIAGNOSIS — I5032 Chronic diastolic (congestive) heart failure: Secondary | ICD-10-CM | POA: Diagnosis not present

## 2015-04-01 DIAGNOSIS — N183 Chronic kidney disease, stage 3 (moderate): Secondary | ICD-10-CM | POA: Diagnosis not present

## 2015-04-01 DIAGNOSIS — L03115 Cellulitis of right lower limb: Secondary | ICD-10-CM | POA: Diagnosis not present

## 2015-04-01 DIAGNOSIS — L03116 Cellulitis of left lower limb: Secondary | ICD-10-CM | POA: Diagnosis not present

## 2015-04-01 DIAGNOSIS — N312 Flaccid neuropathic bladder, not elsewhere classified: Secondary | ICD-10-CM | POA: Diagnosis not present

## 2015-04-01 NOTE — Telephone Encounter (Signed)
Thank you for calling the patient. Plan: Home health nurse to check a BMP and CBC on Monday. DX edema Hospital nurse to notify us if she is not progressing as expected. Patient to weight herself daily, call if she gains more than 4 pounds Patient to keep the appointment 04/12/2015

## 2015-04-01 NOTE — Telephone Encounter (Signed)
Called patient to follow up.  Patient asked that I speak to daughter.  Spoke to daughter and she says that patient seems to be doing okay.  Was able to walk around without trouble breathing.  Daughter says she doesn't hear wheezing like she did in hospital.  Says nurse heard wheezing via stethoscope.  No complaints of shortness of breath or cough.  Pt has hospital follow up scheduled for 04/12/15.  Advised that patient come in sooner.  Pt declined and daughter stated I'm not sure I can get her out of the house.  Says pt is having some trouble with walking due to knee and ankle pain.  She also says that patient has multiple appts scheduled.  That's why they cancelled the appt scheduled on 04/08/15 and rescheduled it 04/12/15.     Called nurse.  Nurse says that after walking, pt had exp. wheezing in upper lobes.  No crackles.  No coughing or shortness of breath.  She says that pt was on  inhalers twice a day in hospital.  Not on inhalers now and called to see if an inhaler could be prescribed.  Nurse says that pt is still weak, but is getting stronger.  Knee pain.  2-3+ non pitting edema in lower extremities.    Ace wraps have been ordered.  Compression hoses were too tight.     Please advise.

## 2015-04-01 NOTE — Telephone Encounter (Signed)
Caller name: [place]   Relationship to patient:  Nurse   Can be reached:[phone]  Pharmacy: [id number] - [place], [address]  Reason for call: She says that when pt was discharged from the hospital they didn't give the pt anything for her breathing. She says when visiting pt today pt was experiencing some wheezing. She would like to know if pt could be prescribed a inhaler.    Please Advise further.

## 2015-04-01 NOTE — Telephone Encounter (Signed)
Please advise. No hospital follow-up currently scheduled.

## 2015-04-02 DIAGNOSIS — N183 Chronic kidney disease, stage 3 (moderate): Secondary | ICD-10-CM | POA: Diagnosis not present

## 2015-04-02 DIAGNOSIS — I5032 Chronic diastolic (congestive) heart failure: Secondary | ICD-10-CM | POA: Diagnosis not present

## 2015-04-02 DIAGNOSIS — I130 Hypertensive heart and chronic kidney disease with heart failure and stage 1 through stage 4 chronic kidney disease, or unspecified chronic kidney disease: Secondary | ICD-10-CM | POA: Diagnosis not present

## 2015-04-02 DIAGNOSIS — L03116 Cellulitis of left lower limb: Secondary | ICD-10-CM | POA: Diagnosis not present

## 2015-04-02 DIAGNOSIS — L03115 Cellulitis of right lower limb: Secondary | ICD-10-CM | POA: Diagnosis not present

## 2015-04-02 DIAGNOSIS — N312 Flaccid neuropathic bladder, not elsewhere classified: Secondary | ICD-10-CM | POA: Diagnosis not present

## 2015-04-02 NOTE — Telephone Encounter (Deleted)
Thank you for calling the patient. Plan: Home health nurse to check a BMP and CBC on Monday. DX edema Hospital nurse to notify us if she is not progressing as expected. Patient to weight herself daily, call if she gains more than 4 pounds Patient to keep the appointment 04/12/2015

## 2015-04-02 NOTE — Telephone Encounter (Signed)
Orders below discussed with Karen Kitchens from [facility].  They were also faxed to office at [phone].    Also called and discussed orders with patient/daughter.  They stated understanding and agreed with plan. Per patient/daughter, patient is doing better.  Pt states she feels better than she did yesterday.  No wheezing or shortness of breath noted at rest, however some wheezing is noted with exertion. They were advised to continue to monitor and if symptoms worsen or new symptoms develop to call office or to call EMS/go to ER.  Daughter stated understanding and agreed to comply.

## 2015-04-05 ENCOUNTER — Other Ambulatory Visit: Payer: Self-pay

## 2015-04-05 DIAGNOSIS — L03116 Cellulitis of left lower limb: Secondary | ICD-10-CM | POA: Diagnosis not present

## 2015-04-05 DIAGNOSIS — N183 Chronic kidney disease, stage 3 (moderate): Secondary | ICD-10-CM | POA: Diagnosis not present

## 2015-04-05 DIAGNOSIS — I5032 Chronic diastolic (congestive) heart failure: Secondary | ICD-10-CM | POA: Diagnosis not present

## 2015-04-05 DIAGNOSIS — I130 Hypertensive heart and chronic kidney disease with heart failure and stage 1 through stage 4 chronic kidney disease, or unspecified chronic kidney disease: Secondary | ICD-10-CM | POA: Diagnosis not present

## 2015-04-05 DIAGNOSIS — L03115 Cellulitis of right lower limb: Secondary | ICD-10-CM | POA: Diagnosis not present

## 2015-04-05 DIAGNOSIS — R600 Localized edema: Secondary | ICD-10-CM | POA: Diagnosis not present

## 2015-04-05 DIAGNOSIS — N312 Flaccid neuropathic bladder, not elsewhere classified: Secondary | ICD-10-CM | POA: Diagnosis not present

## 2015-04-05 LAB — BASIC METABOLIC PANEL
BUN: 31 mg/dL — AB (ref 4–21)
CREATININE: 1.4 mg/dL — AB (ref 0.5–1.1)
GLUCOSE: 102 mg/dL
POTASSIUM: 5.1 mmol/L (ref 3.4–5.3)
SODIUM: 137 mmol/L (ref 137–147)

## 2015-04-05 LAB — CBC AND DIFFERENTIAL
HCT: 35 % — AB (ref 36–46)
Hemoglobin: 11.3 g/dL — AB (ref 12.0–16.0)
PLATELETS: 354 10*3/uL (ref 150–399)
WBC: 7.4 10*3/mL

## 2015-04-05 NOTE — Patient Outreach (Signed)
[facility]) Care Management  04/05/2015  REWA DECKELMAN [birth year] [id number]   RED on EMMI Heart Failure dashboard, assigned Quinn Plowman, RN to outreach for [place] Management services.  Thanks, Ronnell Freshwater. [place], [place] Assistant Phone: [phone] Fax: [phone] f

## 2015-04-05 NOTE — Patient Outreach (Signed)
[facility]) Care Management  04/05/2015  Christina Martin [birth year] [id number]   SUBJECTIVE: Telephone call to patient regarding EMMI heart failure RED referral.  HIPAA verified with patient.  Patient states she is doing much better.  Patient acknowledges receiving EMMI automated phone calls. RNCM discussed and offered continued follow up to patient from [facility] care management.  Patient states she is not having any issues or concerns.  Patient states she may have not understood or answered the question correctly for the [facility] outreach call.  Patient denies any symptoms of anxiety or depression.  Patient states she is managing her heart failure well.  Patient states she weighs daily and records weights.  Patient states she has a follow up appointment scheduled with her primary MD for April 12, 2015.  Patient states she has a follow up appointment with her cardiologist scheduled for April 21, 2015. Patient states the swelling in her legs/feet is improving.  Patient states she is taking her medications as prescribed and does not have any concerns or questions regarding her medications.  Patient refused outreach from [facility] care management pharmacy. Patient states her weight has been stable.  Patient states today's weight is 252 lbs.  Patient denies having any home health services.  Patient denies use of home O2 or nebulizer use.  Patient states her daughter is assisting her with her care at this time. Patient states she knows how and when to contact her doctors for signs and symptoms.  RNCM discussed and offered follow up services with [facility] care management.  Patient refused nursing services at this time.  Patient states she feels she is managing well and does not need services.   ASSESSMENT; EMMI RED heart failure referral.   PLAN: RNCM will close patient at this time due to refusal of [facility] care management services.   Quinn Plowman RN,BSN,CCM [place]  Coordinator [phone]

## 2015-04-06 DIAGNOSIS — L03115 Cellulitis of right lower limb: Secondary | ICD-10-CM | POA: Diagnosis not present

## 2015-04-06 DIAGNOSIS — N183 Chronic kidney disease, stage 3 (moderate): Secondary | ICD-10-CM | POA: Diagnosis not present

## 2015-04-06 DIAGNOSIS — I130 Hypertensive heart and chronic kidney disease with heart failure and stage 1 through stage 4 chronic kidney disease, or unspecified chronic kidney disease: Secondary | ICD-10-CM | POA: Diagnosis not present

## 2015-04-06 DIAGNOSIS — L03116 Cellulitis of left lower limb: Secondary | ICD-10-CM | POA: Diagnosis not present

## 2015-04-06 DIAGNOSIS — I5032 Chronic diastolic (congestive) heart failure: Secondary | ICD-10-CM | POA: Diagnosis not present

## 2015-04-06 DIAGNOSIS — N312 Flaccid neuropathic bladder, not elsewhere classified: Secondary | ICD-10-CM | POA: Diagnosis not present

## 2015-04-07 ENCOUNTER — Other Ambulatory Visit (HOSPITAL_BASED_OUTPATIENT_CLINIC_OR_DEPARTMENT_OTHER): Payer: Medicare Other

## 2015-04-07 ENCOUNTER — Ambulatory Visit (HOSPITAL_BASED_OUTPATIENT_CLINIC_OR_DEPARTMENT_OTHER): Payer: Medicare Other | Admitting: Family

## 2015-04-07 ENCOUNTER — Encounter: Payer: Self-pay | Admitting: Family

## 2015-04-07 ENCOUNTER — Ambulatory Visit (HOSPITAL_BASED_OUTPATIENT_CLINIC_OR_DEPARTMENT_OTHER)
Admission: RE | Admit: 2015-04-07 | Discharge: 2015-04-07 | Disposition: A | Discharge status: Home / Self Care | Payer: Medicare Other | Source: Ambulatory Visit | Attending: Hematology & Oncology | Admitting: Hematology & Oncology

## 2015-04-07 VITALS — BP 119/40 | HR 58 | Temp 97.7°F | Resp 16 | Ht 62.0 in | Wt 250.0 lb

## 2015-04-07 DIAGNOSIS — I82402 Acute embolism and thrombosis of unspecified deep veins of left lower extremity: Secondary | ICD-10-CM | POA: Diagnosis not present

## 2015-04-07 DIAGNOSIS — I82532 Chronic embolism and thrombosis of left popliteal vein: Secondary | ICD-10-CM | POA: Insufficient documentation

## 2015-04-07 DIAGNOSIS — I82512 Chronic embolism and thrombosis of left femoral vein: Secondary | ICD-10-CM | POA: Diagnosis not present

## 2015-04-07 DIAGNOSIS — R600 Localized edema: Secondary | ICD-10-CM | POA: Diagnosis not present

## 2015-04-07 LAB — COMPREHENSIVE METABOLIC PANEL
ALBUMIN: 3.4 g/dL — AB (ref 3.6–5.1)
ALT: 16 U/L (ref 6–29)
AST: 22 U/L (ref 10–35)
Alkaline Phosphatase: 126 U/L (ref 33–130)
BILIRUBIN TOTAL: 0.4 mg/dL (ref 0.2–1.2)
BUN: 27 mg/dL — AB (ref 7–25)
CHLORIDE: 94 mmol/L — AB (ref 98–110)
CO2: 30 mmol/L (ref 20–31)
Calcium: 10 mg/dL (ref 8.6–10.4)
Creatinine, Ser: 1.69 mg/dL — ABNORMAL HIGH (ref 0.60–0.88)
Glucose, Bld: 90 mg/dL (ref 65–99)
Potassium: 4.4 mmol/L (ref 3.5–5.3)
SODIUM: 135 mmol/L (ref 135–146)
TOTAL PROTEIN: 7.1 g/dL (ref 6.1–8.1)

## 2015-04-07 LAB — CBC WITH DIFFERENTIAL (CANCER CENTER ONLY)
BASO#: 0.1 10*3/uL (ref 0.0–0.2)
BASO%: 0.9 % (ref 0.0–2.0)
EOS%: 6.8 % (ref 0.0–7.0)
Eosinophils Absolute: 0.6 10*3/uL — ABNORMAL HIGH (ref 0.0–0.5)
HCT: 35.4 % (ref 34.8–46.6)
HEMOGLOBIN: 11.6 g/dL (ref 11.6–15.9)
LYMPH#: 2.3 10*3/uL (ref 0.9–3.3)
LYMPH%: 28.2 % (ref 14.0–48.0)
MCH: 29.3 pg (ref 26.0–34.0)
MCHC: 32.8 g/dL (ref 32.0–36.0)
MCV: 89 fL (ref 81–101)
MONO#: 1.2 10*3/uL — ABNORMAL HIGH (ref 0.1–0.9)
MONO%: 14.2 % — AB (ref 0.0–13.0)
NEUT%: 49.9 % (ref 39.6–80.0)
NEUTROS ABS: 4.1 10*3/uL (ref 1.5–6.5)
Platelets: 356 10*3/uL (ref 145–400)
RBC: 3.96 10*6/uL (ref 3.70–5.32)
RDW: 14.2 % (ref 11.1–15.7)
WBC: 8.2 10*3/uL (ref 3.9–10.0)

## 2015-04-07 NOTE — Progress Notes (Signed)
Hematology and Oncology Follow Up Visit  SHAWNI LACHMAN [id number] [birth year] 81 y.o. 04/07/2015   Principle Diagnosis:  DVT of the left leg  Current Therapy:   Xarelto 20 mg PO daily - finishes one full year in April 2017    Interim History:  Ms. Varner is here today with her daughter for a follow-up. She is recuperating from a recent stay in the hospital for questionable CHF. She went into the ED with SOB and fluid overload. She was treated with Lasix in hospital and also now at home.  Her EF on 11/06 was 65-70%. Her symptoms have improved. She is SOB with exertion at times but this is back to her baseline.  She has an appointment with Cardiology next week.  The swelling in her legs is improving. We did get an US of the left leg this morning. This showed no occlusive DVT. She has what is likely a chronic DVT involving the left femoral vein and popliteal vein. She will continue on on Xarelto for 1 year finishing in April 2017.  She has had no episodes of bleeding or bruising.  She has had no fever, chills, n/v, cough, rash, dizziness, chest pain, palpitations, abdominal pain or changes in bowel or bladder habits. He suprapubic catheter is functioning appropriately.  She has numbness and tingling in her hands and feet. She describes this as mild and unchanged. She has had no falls. She states that she uses a cane to ambulate at home and is careful with her activity.  She states that she would like to keep losing weight. She has a good appetite and is staying hydrated. She was on fluid restriction in the hospital and now that she is home she has been monitoring her intake but feels that she is getting enough fluid. Her weight is stable.   Medications:    Medication List       This list is accurate as of: 04/07/15  3:26 PM.  Always use your most recent med list.               acetaminophen 325 MG tablet  Commonly known as:  TYLENOL  Take 650 mg by mouth every 6 (six) hours as needed  (pain).     benazepril 40 MG tablet  Commonly known as:  LOTENSIN  Take 1 tablet (40 mg total) by mouth daily.     CALCIUM 600 + D PO  Take 2 capsules by mouth daily. \     CITRUCEL PO  Take 2 tablets by mouth daily.     doxycycline 100 MG tablet  Commonly known as:  VIBRA-TABS  Take 1 tablet (100 mg total) by mouth 2 (two) times daily.     Fish Oil 1200 MG Caps  Take 1 capsule by mouth daily.     furosemide 40 MG tablet  Commonly known as:  LASIX  Take 40 mg by mouth 2 (two) times daily.     KLOR-CON M20 20 MEQ tablet  Generic drug:  potassium chloride SA  TAKE 1 TABLET BY MOUTH DAILY     lactulose 10 GM/15ML solution  Commonly known as:  CHRONULAC  Take 15 mLs (10 g total) by mouth daily as needed for mild constipation.     multivitamin tablet  Take 1 tablet by mouth daily.     omeprazole 20 MG capsule  Commonly known as:  PRILOSEC  Take 20 mg by mouth daily. Take only on Sun, Tues,Thurs, Sat  STOOL SOFTENER PO  Take 1 capsule by mouth daily.     traMADol 50 MG tablet  Commonly known as:  ULTRAM  Take 1 tablet (50 mg total) by mouth 3 (three) times daily as needed.     vitamin E 400 UNIT capsule  Take 400 Units by mouth daily.     XARELTO 20 MG Tabs tablet  Generic drug:  rivaroxaban  TAKE 1 TABLET (20 MG TOTAL) BY MOUTH DAILY WITH SUPPER.        Allergies:  Allergies  Allergen Reactions  . Lithostat [Acetohydroxamic Acid] Other (See Comments)    Unusual tiredness, weakness, nervousness, mental confusion, sore throat, Poor appetite    Past Medical History, Surgical history, Social history, and Family History were reviewed and updated.  Review of Systems: All other 10 point review of systems is negative.   Physical Exam:  height is 5\' 2"  (1.575 m) and weight is 250 lb (113.399 kg). Her oral temperature is 97.7 F (36.5 C). Her blood pressure is 119/40 and her pulse is 58. Her respiration is 16.   Wt Readings from Last 3 Encounters:  04/07/15  250 lb (113.399 kg)  03/29/15 257 lb 4.8 oz (116.711 kg)  03/26/15 262 lb 2 oz (118.899 kg)    Ocular: Sclerae unicteric, pupils equal, round and reactive to light Ear-nose-throat: Oropharynx clear, dentition fair Lymphatic: No cervical or supraclavicular adenopathy Lungs no rales or rhonchi, good excursion bilaterally Heart regular rate and rhythm, no murmur appreciated Abd soft, nontender, positive bowel sounds MSK no focal spinal tenderness, no joint edema Neuro: non-focal, well-oriented, appropriate affect Breasts: Deferred  Lab Results  Component Value Date   WBC 8.2 04/07/2015   HGB 11.6 04/07/2015   HCT 35.4 04/07/2015   MCV 89 04/07/2015   PLT 356 04/07/2015   No results found for: FERRITIN, IRON, TIBC, UIBC, IRONPCTSAT Lab Results  Component Value Date   RBC 3.96 04/07/2015   No results found for: KPAFRELGTCHN, LAMBDASER, KAPLAMBRATIO No results found for: IGGSERUM, IGA, IGMSERUM No results found for: Odetta Pink, SPEI   Chemistry      Component Value Date/Time   NA 138 03/29/2015 0338   K 4.6 03/29/2015 0338   CL 100* 03/29/2015 0338   CO2 30 03/29/2015 0338   BUN 34* 03/29/2015 0338   CREATININE 1.75* 03/29/2015 0338   CREATININE 1.87* 10/02/2014 1514      Component Value Date/Time   CALCIUM 9.0 03/29/2015 0338   ALKPHOS 91 03/26/2015 1212   AST 23 03/26/2015 1212   ALT 16 03/26/2015 1212   BILITOT 1.1 03/26/2015 1212     Impression and Plan: Ms.Gartley is a pleasant 81 yo white female with history of a thrombus in the left leg. [place] today showed no occlusive DVT but she does likely have a chronic DVT involving the left femoral and popliteal veins.  She will continue on Xarelto for one full year finishing in April 2017. She has had no issues with bleeding or bruising.  She was hospitalized earlier this month with questionable CHF and fluid overload. Her EF was 65-70% She is following up with cardiology  next week.  We will plan to see her back in 5 months for labs and follow-up. At that point she will have completed 1 year of anticoagulation. We will discuss discontinuing the Xarelto at that time and transitioning to aspirin.  Both she and her daughter know to contact us with any questions or concerns. We  can certainly see her sooner if need be.   Eliezer Bottom, NP 11/16/20163:26 PM

## 2015-04-07 NOTE — Patient Outreach (Signed)
[facility]) Care Management  04/07/2015  SHARDEE BRIZENDINE [birth year] [id number]   Triggered RED on EMMI Heart Failure dashboard, notification sent to Quinn Plowman, RN.  Thanks, Ronnell Freshwater. [place], [place] Assistant Phone: [phone] Fax: [phone]

## 2015-04-08 ENCOUNTER — Ambulatory Visit: Payer: PRIVATE HEALTH INSURANCE | Admitting: Internal Medicine

## 2015-04-08 ENCOUNTER — Telehealth: Payer: Self-pay | Admitting: Internal Medicine

## 2015-04-08 DIAGNOSIS — N183 Chronic kidney disease, stage 3 (moderate): Secondary | ICD-10-CM | POA: Diagnosis not present

## 2015-04-08 DIAGNOSIS — L03116 Cellulitis of left lower limb: Secondary | ICD-10-CM | POA: Diagnosis not present

## 2015-04-08 DIAGNOSIS — L03115 Cellulitis of right lower limb: Secondary | ICD-10-CM | POA: Diagnosis not present

## 2015-04-08 DIAGNOSIS — I5032 Chronic diastolic (congestive) heart failure: Secondary | ICD-10-CM | POA: Diagnosis not present

## 2015-04-08 DIAGNOSIS — I130 Hypertensive heart and chronic kidney disease with heart failure and stage 1 through stage 4 chronic kidney disease, or unspecified chronic kidney disease: Secondary | ICD-10-CM | POA: Diagnosis not present

## 2015-04-08 DIAGNOSIS — N312 Flaccid neuropathic bladder, not elsewhere classified: Secondary | ICD-10-CM | POA: Diagnosis not present

## 2015-04-08 NOTE — Telephone Encounter (Signed)
Caller name: Judie Petit  Relation to [id number] with [facility] Call back number: [phone]   Reason for call:  Patient has a new breakdown on her coccyx bone requesting verbal orders to treat.

## 2015-04-08 NOTE — Telephone Encounter (Signed)
Please advise 

## 2015-04-08 NOTE — Telephone Encounter (Signed)
LMOM with verbal orders. Instructed Tisha to call if any further questions or concerns.

## 2015-04-08 NOTE — Telephone Encounter (Signed)
Please do

## 2015-04-09 ENCOUNTER — Other Ambulatory Visit: Payer: Self-pay

## 2015-04-09 ENCOUNTER — Telehealth: Payer: Self-pay | Admitting: Internal Medicine

## 2015-04-09 DIAGNOSIS — N312 Flaccid neuropathic bladder, not elsewhere classified: Secondary | ICD-10-CM | POA: Diagnosis not present

## 2015-04-09 DIAGNOSIS — I130 Hypertensive heart and chronic kidney disease with heart failure and stage 1 through stage 4 chronic kidney disease, or unspecified chronic kidney disease: Secondary | ICD-10-CM | POA: Diagnosis not present

## 2015-04-09 DIAGNOSIS — L03116 Cellulitis of left lower limb: Secondary | ICD-10-CM | POA: Diagnosis not present

## 2015-04-09 DIAGNOSIS — L03115 Cellulitis of right lower limb: Secondary | ICD-10-CM | POA: Diagnosis not present

## 2015-04-09 DIAGNOSIS — I5032 Chronic diastolic (congestive) heart failure: Secondary | ICD-10-CM | POA: Diagnosis not present

## 2015-04-09 DIAGNOSIS — N183 Chronic kidney disease, stage 3 (moderate): Secondary | ICD-10-CM | POA: Diagnosis not present

## 2015-04-09 NOTE — Telephone Encounter (Signed)
Received fax confirmation on 04/09/2015 at 11:05.

## 2015-04-09 NOTE — Telephone Encounter (Signed)
Labs 04/05/2015: Hemoglobin 11.3 stable. Platelets 354. Potassium 5.1, creatinine 1.4 (last creatinine at the hospital 1.6). Plan: We will scan results. Labs are stable.  Please let the [facility] team known.

## 2015-04-09 NOTE — Telephone Encounter (Signed)
Results abstracted, faxed to [facility] at [phone], and sent for scanning.

## 2015-04-09 NOTE — Patient Outreach (Signed)
[facility]) Care Management  04/09/2015  MARYEMMA SIEW [birth year] [id number]  Telephone call to patient regarding EMMI heart failure RED referral. Unable to reach patient. HIPAA compliant voice message left with call back phone number.   PLAN; RNCM will attempt 2nd telephone outreach to patient within 3 business days.   Quinn Plowman RN,BSN,CCM [place] Coordinator [phone]

## 2015-04-12 ENCOUNTER — Ambulatory Visit (INDEPENDENT_AMBULATORY_CARE_PROVIDER_SITE_OTHER): Payer: Medicare Other | Admitting: Internal Medicine

## 2015-04-12 ENCOUNTER — Encounter: Payer: Self-pay | Admitting: Internal Medicine

## 2015-04-12 VITALS — BP 132/82 | HR 84 | Temp 98.2°F | Ht 62.0 in | Wt 248.1 lb

## 2015-04-12 DIAGNOSIS — I130 Hypertensive heart and chronic kidney disease with heart failure and stage 1 through stage 4 chronic kidney disease, or unspecified chronic kidney disease: Secondary | ICD-10-CM | POA: Diagnosis not present

## 2015-04-12 DIAGNOSIS — L03116 Cellulitis of left lower limb: Secondary | ICD-10-CM | POA: Diagnosis not present

## 2015-04-12 DIAGNOSIS — Z23 Encounter for immunization: Secondary | ICD-10-CM | POA: Diagnosis not present

## 2015-04-12 DIAGNOSIS — Z09 Encounter for follow-up examination after completed treatment for conditions other than malignant neoplasm: Secondary | ICD-10-CM

## 2015-04-12 DIAGNOSIS — I5032 Chronic diastolic (congestive) heart failure: Secondary | ICD-10-CM | POA: Diagnosis not present

## 2015-04-12 DIAGNOSIS — N183 Chronic kidney disease, stage 3 (moderate): Secondary | ICD-10-CM | POA: Diagnosis not present

## 2015-04-12 DIAGNOSIS — N312 Flaccid neuropathic bladder, not elsewhere classified: Secondary | ICD-10-CM | POA: Diagnosis not present

## 2015-04-12 DIAGNOSIS — R609 Edema, unspecified: Secondary | ICD-10-CM | POA: Diagnosis not present

## 2015-04-12 DIAGNOSIS — D649 Anemia, unspecified: Secondary | ICD-10-CM | POA: Diagnosis not present

## 2015-04-12 DIAGNOSIS — L03115 Cellulitis of right lower limb: Secondary | ICD-10-CM | POA: Diagnosis not present

## 2015-04-12 DIAGNOSIS — R0609 Other forms of dyspnea: Secondary | ICD-10-CM | POA: Diagnosis not present

## 2015-04-12 LAB — BASIC METABOLIC PANEL
BUN: 20 mg/dL (ref 6–23)
CHLORIDE: 95 meq/L — AB (ref 96–112)
CO2: 33 mEq/L — ABNORMAL HIGH (ref 19–32)
CREATININE: 1.77 mg/dL — AB (ref 0.40–1.20)
Calcium: 10 mg/dL (ref 8.4–10.5)
GFR: 28.73 mL/min — ABNORMAL LOW (ref >60.00)
GLUCOSE: 92 mg/dL (ref 70–99)
Potassium: 4.2 mEq/L (ref 3.5–5.1)
Sodium: 136 mEq/L (ref 135–145)

## 2015-04-12 NOTE — Progress Notes (Signed)
Pre visit review using our clinic review tool, if applicable. No additional management support is needed unless otherwise documented below in the visit note. 

## 2015-04-12 NOTE — Patient Instructions (Addendum)
Get your blood work before you leave   Weight yourself 3 or 4 times a week. If you gain more than 5 pounds call the office   Next visit  for a routine checkup in 3 months    Please schedule an appointment at the front desk   No need to come back fasting

## 2015-04-12 NOTE — Progress Notes (Signed)
Subjective:    Patient ID: Christina Martin, female    DOB: 11/23/1924, 81 y.o.   MRN: [id number]  DOS:  04/12/2015 Type of visit - description :   Admitted to the hospital 03/26/2015 for 3 days:  Admitted to the hospital with edema, wheezing, question of cellulitis and hypoxia. Echo showed EF of 65% with grade 1 diastolic dysfunction. Wt decreased from 262 to 257. Saw cardiology: sx were felt not to be CHF. Likely it was simply worsening of lower extremity edema. She was hypoxic prior to admission but O2 sat normal upon arrival to the ER >> likely a in accurate reading  at this office. Since she left the hospital she is feeling better. Good compliance with medication, edema definitely decreased.  Review of Systems   no fever chills No chest pain or difficulty breathing. + DOE, at baseline or even better Still has occasional wheezing   Past Medical History  Diagnosis Date  . Nonspecific elevation of levels of transaminase or lactic acid dehydrogenase (LDH) 1996    + Hepatitis srology  . GERD (gastroesophageal reflux disease)   . HTN (hypertension) 12/30/2007  . Diastolic dysfunction, left ventricle 05/30/2013    05/2013 see ECHO   . DJD (degenerative joint disease)   . Uterine cancer  (h/O)     no recent f/u   . ATONIC BLADDER-- has a permananet suprapubic catheter    . Prediabetes 04/07/2014  . Calculus in urethra     Dr. Diona Fanti    Past Surgical History  Procedure Laterality Date  . Esophageal dilation  2006  . Total knee arthroplasty  1996 & 2002  . Appendectomy    . Total abdominal hysterectomy w/ bilateral salpingoophorectomy  1990    uterine cancer  . Colonoscopy w/ polypectomy  1998    Tics in 2006; Dr Sharlett Iles  . Sbo  1991    adhesions resected  . Posterior laminectomy / decompression lumbar spine  2009    Dr Lorin Mercy  . Cataract surgery  2014    bilateral; Dr Prudencio Burly    Social History   Social History  . Marital Status: Widowed    Spouse Name: N/A  .  Number of Children: 3  . Years of Education: N/A   Occupational History  . Retired-office work    Social History Main Topics  . Smoking status: Former Smoker    Quit date: 05/22/1986  . Smokeless tobacco: Not on file     Comment: smoked 1944-1988 , up to 1 ppd  . Alcohol Use: No  . Drug Use: No  . Sexual Activity: Not on file   Other Topics Concern  . Not on file   Social History Narrative   Lives by herself, drives           Medication List       This list is accurate as of: 04/12/15 11:59 PM.  Always use your most recent med list.               acetaminophen 325 MG tablet  Commonly known as:  TYLENOL  Take 650 mg by mouth every 6 (six) hours as needed (pain).     benazepril 40 MG tablet  Commonly known as:  LOTENSIN  Take 1 tablet (40 mg total) by mouth daily.     CALCIUM 600 + D PO  Take 2 capsules by mouth daily. \     CITRUCEL PO  Take 2 tablets by mouth daily.  Fish Oil 1200 MG Caps  Take 1 capsule by mouth daily.     furosemide 40 MG tablet  Commonly known as:  LASIX  Take 40 mg by mouth 2 (two) times daily.     KLOR-CON M20 20 MEQ tablet  Generic drug:  potassium chloride SA  TAKE 1 TABLET BY MOUTH DAILY     lactulose 10 GM/15ML solution  Commonly known as:  CHRONULAC  Take 15 mLs (10 g total) by mouth daily as needed for mild constipation.     multivitamin tablet  Take 1 tablet by mouth daily.     omeprazole 20 MG capsule  Commonly known as:  PRILOSEC  Take 20 mg by mouth daily. Take only on Sun, Tues,Thurs, Sat     STOOL SOFTENER PO  Take 1 capsule by mouth daily.     traMADol 50 MG tablet  Commonly known as:  ULTRAM  Take 1 tablet (50 mg total) by mouth 3 (three) times daily as needed.     vitamin E 400 UNIT capsule  Take 400 Units by mouth daily.     XARELTO 20 MG Tabs tablet  Generic drug:  rivaroxaban  TAKE 1 TABLET (20 MG TOTAL) BY MOUTH DAILY WITH SUPPER.           Objective:   Physical Exam BP 132/82 mmHg   Pulse 84  Temp(Src) 98.2 F (36.8 C) (Oral)  Ht 5\' 2"  (1.575 m)  Wt 248 lb 2 oz (112.549 kg)  BMI 45.37 kg/m2  SpO2 92% General:   Well developed, well nourished . NAD.  HEENT:  Normocephalic . Face symmetric, atraumatic Lungs:  CTA B Normal respiratory effort, no intercostal retractions, no accessory muscle use. Heart: RRR,  no murmur.  +/+++ pretibial edema bilaterally  Skin:  pretibial skin very dry, scaly, reddish in color but not warm or tender to touch. Neuro: alert & oriented X3.  Speech normal, gait appropriate for age and unassisted Psych--  Cognition and judgment appear intact.  Cooperative with normal attention span and concentration.  Behavior appropriate. No anxious or depressed appearing.      Assessment & Plan:    Assessment>  Prediabetes w/ neuropathy ( + sx and decreased pinprick examination 03-2015) Mild renal failure, creatinine 1.4 baseline  HTN DJD -- tramadol prn  GI: --GERD  --H/o Esophageal stricture  L leg DVT 08-2014, saw hematology 12-2014, idiopathic, rec   xarelto x 1 year CHF (diastolic)---echo [id number] diastolic dysfx, LVH, nl EF Dr Angelena Form Atonic  bladder, permanent suprapubic catheter Renal stones h/oUterine cancer  TAH/BSO (1990s) H/p Elevated LFTs (1990s, h/o  + hep serology) Restasis dermatitis  H/o admitted 03-2015 with edema, felt not to be CHF.    PLAN Edema: Admitted to hospital with edema,(no CHF)  she is now better. Continue current dose of Lasix (40 bid as before), her ideal weight would be around 250 pounds.  Check a BMP and also a CBC as she was slightly anemic. Cellulitis, stasis dermatitis: Status post antibiotics at the hospital, now skin looks like stasis dermatitis w/o infex>>> recommend edema and Eucerin cream. Bronchospasm: Patient was wheezing before the admission, she is a former smoker, currently asymptomatic. Check PFTs Primary care flu shot today RTC 3 months

## 2015-04-13 DIAGNOSIS — N312 Flaccid neuropathic bladder, not elsewhere classified: Secondary | ICD-10-CM | POA: Diagnosis not present

## 2015-04-13 DIAGNOSIS — I5032 Chronic diastolic (congestive) heart failure: Secondary | ICD-10-CM | POA: Diagnosis not present

## 2015-04-13 DIAGNOSIS — L03115 Cellulitis of right lower limb: Secondary | ICD-10-CM | POA: Diagnosis not present

## 2015-04-13 DIAGNOSIS — N183 Chronic kidney disease, stage 3 (moderate): Secondary | ICD-10-CM | POA: Diagnosis not present

## 2015-04-13 DIAGNOSIS — L03116 Cellulitis of left lower limb: Secondary | ICD-10-CM | POA: Diagnosis not present

## 2015-04-13 DIAGNOSIS — I130 Hypertensive heart and chronic kidney disease with heart failure and stage 1 through stage 4 chronic kidney disease, or unspecified chronic kidney disease: Secondary | ICD-10-CM | POA: Diagnosis not present

## 2015-04-13 LAB — CBC WITH DIFFERENTIAL/PLATELET
BASOS PCT: 0.6 % (ref 0.0–3.0)
Basophils Absolute: 0.1 10*3/uL (ref 0.0–0.1)
EOS ABS: 0.5 10*3/uL (ref 0.0–0.7)
EOS PCT: 5.2 % — AB (ref 0.0–5.0)
HCT: 38 % (ref 36.0–46.0)
HEMOGLOBIN: 12.2 g/dL (ref 12.0–15.0)
LYMPHS ABS: 1.9 10*3/uL (ref 0.7–4.0)
Lymphocytes Relative: 19.8 % (ref 12.0–46.0)
MCHC: 32 g/dL (ref 30.0–36.0)
MCV: 89.5 fl (ref 78.0–100.0)
MONO ABS: 0.9 10*3/uL (ref 0.1–1.0)
Monocytes Relative: 9.8 % (ref 3.0–12.0)
NEUTROS PCT: 64.6 % (ref 43.0–77.0)
Neutro Abs: 6.2 10*3/uL (ref 1.4–7.7)
PLATELETS: 314 10*3/uL (ref 150.0–400.0)
RBC: 4.25 Mil/uL (ref 3.87–5.11)
RDW: 14.3 % (ref 11.5–15.5)
WBC: 9.6 10*3/uL (ref 4.0–10.5)

## 2015-04-13 NOTE — Assessment & Plan Note (Signed)
Edema: Admitted to hospital with edema,(no CHF)  she is now better. Continue current dose of Lasix (40 bid as before), her ideal weight would be around 250 pounds.  Check a BMP and also a CBC as she was slightly anemic. Cellulitis, stasis dermatitis: Status post antibiotics at the hospital, now skin looks like stasis dermatitis w/o infex>>> recommend edema and Eucerin cream. Bronchospasm: Patient was wheezing before the admission, she is a former smoker, currently asymptomatic. Check PFTs Primary care flu shot today RTC 3 months

## 2015-04-19 DIAGNOSIS — L03115 Cellulitis of right lower limb: Secondary | ICD-10-CM | POA: Diagnosis not present

## 2015-04-19 DIAGNOSIS — N183 Chronic kidney disease, stage 3 (moderate): Secondary | ICD-10-CM | POA: Diagnosis not present

## 2015-04-19 DIAGNOSIS — N312 Flaccid neuropathic bladder, not elsewhere classified: Secondary | ICD-10-CM | POA: Diagnosis not present

## 2015-04-19 DIAGNOSIS — L03116 Cellulitis of left lower limb: Secondary | ICD-10-CM | POA: Diagnosis not present

## 2015-04-19 DIAGNOSIS — I5032 Chronic diastolic (congestive) heart failure: Secondary | ICD-10-CM | POA: Diagnosis not present

## 2015-04-19 DIAGNOSIS — I130 Hypertensive heart and chronic kidney disease with heart failure and stage 1 through stage 4 chronic kidney disease, or unspecified chronic kidney disease: Secondary | ICD-10-CM | POA: Diagnosis not present

## 2015-04-20 ENCOUNTER — Other Ambulatory Visit: Payer: Self-pay

## 2015-04-20 DIAGNOSIS — I130 Hypertensive heart and chronic kidney disease with heart failure and stage 1 through stage 4 chronic kidney disease, or unspecified chronic kidney disease: Secondary | ICD-10-CM | POA: Diagnosis not present

## 2015-04-20 DIAGNOSIS — N312 Flaccid neuropathic bladder, not elsewhere classified: Secondary | ICD-10-CM | POA: Diagnosis not present

## 2015-04-20 DIAGNOSIS — N183 Chronic kidney disease, stage 3 (moderate): Secondary | ICD-10-CM | POA: Diagnosis not present

## 2015-04-20 DIAGNOSIS — L03116 Cellulitis of left lower limb: Secondary | ICD-10-CM | POA: Diagnosis not present

## 2015-04-20 DIAGNOSIS — I5032 Chronic diastolic (congestive) heart failure: Secondary | ICD-10-CM | POA: Diagnosis not present

## 2015-04-20 DIAGNOSIS — L03115 Cellulitis of right lower limb: Secondary | ICD-10-CM | POA: Diagnosis not present

## 2015-04-20 NOTE — Patient Outreach (Signed)
[facility]) Care Management  04/20/2015  Christina Martin [birth year] [id number]  Telephone call to patient regarding EMMI heart failure red referral.  Unable to reach patient. HIPAA compliant voice message left with call back phone number.   PLAN; RNCM will attempt 2nd telephone outreach to patient within 3 business days.   Quinn Plowman RN,BSN,CCM [place] Coordinator [phone]

## 2015-04-21 ENCOUNTER — Ambulatory Visit (INDEPENDENT_AMBULATORY_CARE_PROVIDER_SITE_OTHER): Payer: Medicare Other | Admitting: Cardiology

## 2015-04-21 ENCOUNTER — Telehealth: Payer: Self-pay | Admitting: *Deleted

## 2015-04-21 ENCOUNTER — Encounter: Payer: Self-pay | Admitting: Cardiology

## 2015-04-21 ENCOUNTER — Other Ambulatory Visit: Payer: Self-pay

## 2015-04-21 VITALS — BP 110/76 | HR 82 | Ht 62.5 in | Wt 244.8 lb

## 2015-04-21 DIAGNOSIS — L03116 Cellulitis of left lower limb: Secondary | ICD-10-CM | POA: Diagnosis not present

## 2015-04-21 DIAGNOSIS — N312 Flaccid neuropathic bladder, not elsewhere classified: Secondary | ICD-10-CM | POA: Diagnosis not present

## 2015-04-21 DIAGNOSIS — I10 Essential (primary) hypertension: Secondary | ICD-10-CM

## 2015-04-21 DIAGNOSIS — N183 Chronic kidney disease, stage 3 (moderate): Secondary | ICD-10-CM | POA: Diagnosis not present

## 2015-04-21 DIAGNOSIS — I5032 Chronic diastolic (congestive) heart failure: Secondary | ICD-10-CM | POA: Diagnosis not present

## 2015-04-21 DIAGNOSIS — I25119 Atherosclerotic heart disease of native coronary artery with unspecified angina pectoris: Secondary | ICD-10-CM | POA: Diagnosis not present

## 2015-04-21 DIAGNOSIS — L03115 Cellulitis of right lower limb: Secondary | ICD-10-CM | POA: Diagnosis not present

## 2015-04-21 DIAGNOSIS — I130 Hypertensive heart and chronic kidney disease with heart failure and stage 1 through stage 4 chronic kidney disease, or unspecified chronic kidney disease: Secondary | ICD-10-CM | POA: Diagnosis not present

## 2015-04-21 NOTE — Patient Outreach (Signed)
[facility]) Care Management  [place]  04/21/2015   Christina Martin [birth year] [id number]  Subjective: Telephone call to patient regarding heart failure follow up. Patient reports she is doing well today.  Patient reports todays weight at 242lbs.  Patient states she has little swelling in feet/legs and denies any swelling in her hands.  Patient denies any abnormal shortness of breath and coughing.  Patient confirms keeping appointment with her primary MD.  Patient denies any changes to current treatment with her primary doctor.   RNCM reviewed signs and symptoms of heart failure with patient and advised patient to contact doctors office if heart failure symptoms noticed.  RNCM advised patient to take her medication as prescribed by her doctor and keep follow up appointments. Patient verbalized understanding. RNCM discussed fall prevention with patient.  Patient verbalized understanding. Patient verbally agreed to next outreach call with RNCM.   Objective: see assessment  Current Medications:  Current Outpatient Prescriptions  Medication Sig Dispense Refill  . acetaminophen (TYLENOL) 325 MG tablet Take 650 mg by mouth every 6 (six) hours as needed (pain).     . benazepril (LOTENSIN) 40 MG tablet Take 1 tablet (40 mg total) by mouth daily. 90 tablet 1  . Calcium Carbonate-Vitamin D (CALCIUM 600 + D PO) Take 2 capsules by mouth daily. \    . Docusate Calcium (STOOL SOFTENER PO) Take 1 capsule by mouth daily.     . furosemide (LASIX) 40 MG tablet Take 40 mg by mouth 2 (two) times daily.    Marland Kitchen KLOR-CON M20 20 MEQ tablet TAKE 1 TABLET BY MOUTH DAILY 90 tablet 1  . lactulose (CHRONULAC) 10 GM/15ML solution Take 15 mLs (10 g total) by mouth daily as needed for mild constipation. 472 mL 0  . Methylcellulose, Laxative, (CITRUCEL PO) Take 2 tablets by mouth daily.    . Multiple Vitamin (MULTIVITAMIN) tablet Take 1 tablet by mouth daily.      . Omega-3 Fatty Acids (FISH OIL)  1200 MG CAPS Take 1 capsule by mouth daily.     Marland Kitchen omeprazole (PRILOSEC) 20 MG capsule Take 20 mg by mouth daily. Take only on Sun, Tues,Thurs, Sat    . traMADol (ULTRAM) 50 MG tablet Take 1 tablet (50 mg total) by mouth 3 (three) times daily as needed. 90 tablet 0  . vitamin E 400 UNIT capsule Take 400 Units by mouth daily.      [name] 20 MG TABS tablet TAKE 1 TABLET (20 MG TOTAL) BY MOUTH DAILY WITH SUPPER. 30 tablet 2   No current facility-administered medications for this visit.    Functional Status:  In your present state of health, do you have any difficulty performing the following activities: 04/21/2015 03/26/2015  Hearing? Y N  Vision? N N  Difficulty concentrating or making decisions? N N  Walking or climbing stairs? Y Y  Dressing or bathing? Y N  Doing errands, shopping? Tempie Donning  Preparing Food and eating ? Y -  Using the Toilet? N -  In the past six months, have you accidently leaked urine? N -  Do you have problems with loss of bowel control? N -  Managing your Medications? N -  Managing your Finances? N -  Housekeeping or managing your Housekeeping? Y -    Fall/Depression Screening: PHQ 2/9 Scores 04/21/2015 04/05/2015 07/21/2014 05/21/2013 03/22/2012  PHQ - 2 Score 0 0 0 1 0    Fall Risk  04/21/2015 04/21/2015 04/07/2015 01/19/2015 07/21/2014  Falls in the past year? - Yes No No No  Number falls in past yr: - 1 - - -  Injury with Fall? - No - - -  Risk for fall due to : - History of fall(s) - - -  Follow up (No Data) Education provided - - -   Assessment: EMMI heart failure red follow up.  Patient will continue to be followed by telephonic RNCM for heart failure management/education  Plan: RNCM will follow up with patient within 1 week RNCM will send barrier/involvement letter to patients primary MD [facility] will send patient [facility] care management welcome letter/packet/consent RNCM will send patient EMMI education material on heart failure and fall prevention. Patient  will report continued weight monitoring and recording at next outreach.   Quinn Plowman RN,BSN,CCM [place] Coordinator [phone]

## 2015-04-21 NOTE — Telephone Encounter (Signed)
Forwarded to Dr. Paz. JG//CMA 

## 2015-04-21 NOTE — Patient Instructions (Signed)
Medication Instructions:  Your physician recommends that you continue on your current medications as directed. Please refer to the Current Medication list given to you today.   Labwork: None ordered  Testing/Procedures: None ordered  Follow-Up: Your physician wants you to follow-up in: 6 MONTHS WITH DR. MCALHANY   You will receive a reminder letter in the mail two months in advance. If you don't receive a letter, please call our office to schedule the follow-up appointment.   Any Other Special Instructions Will Be Listed Below (If Applicable).     If you need a refill on your cardiac medications before your next appointment, please call your pharmacy.   

## 2015-04-21 NOTE — Progress Notes (Signed)
04/21/2015 Christina Martin   1925/03/19  [id number]  Primary Physician [name], MD Primary Cardiologist: Dr. Angelena Form   Reason for Visit/CC: [facility] F/u for Bilateral LEE  HPI:  The patient is a 81 year old female, followed by Dr. Angelena Form, with a history of chronic diastolic dysfunction, HTN GERD, neurogenic bladder with suprapubic catheter, chronic renal insufficieny with baseline SCr at 1.7 and DVT 08/2014 now on Xarelto. She presents to clinic today for post hospital follow-up. She was recently admitted to [facility] by internal medicine for evaluation of bilateral lower extremity edema. LE venous dopplers showed continued occlusive thrombus of the mid femoral vein, distal femoral vein and popliteal vein in LLE from prior DVT diagnosed 08/2014. CXR showed no edema. BNP was normal at 89. 2D echo showed grade I diastolic dysfunction but normal EF. It was determined that her lower extremity edema was likely secondary to lymphedema and possible cellulitis. She was treated with IV diuretics and eventually transition to by mouth dose of 40 mg twice a day. She was also treated with antibodies for presumed cellulitis. She was unable to wear compression stockings due to inability to find appropriate size. It was recommended that she use Ace bandages for compression for her edema. She was discharged home and instructed to f/u in our office.   She is accompanied to clinic by her daughter. She reports that she has done well since discharge. She has noted some continued improvement in her lower extremities however she continues to have some mild chronic edema. She denies any fevers or chills. No dyspnea. She reports full medication compliance. Since discharge from the hospital, she has had some skin blistering of her lower extremities however this appears to be improving.   Current Outpatient Prescriptions  Medication Sig Dispense Refill  . acetaminophen (TYLENOL) 325 MG tablet Take 650 mg by  mouth every 6 (six) hours as needed (pain).     . benazepril (LOTENSIN) 40 MG tablet Take 1 tablet (40 mg total) by mouth daily. 90 tablet 1  . Calcium Carbonate-Vitamin D (CALCIUM 600 + D PO) Take 2 capsules by mouth daily. \    . Docusate Calcium (STOOL SOFTENER PO) Take 1 capsule by mouth daily.     . furosemide (LASIX) 40 MG tablet Take 40 mg by mouth 2 (two) times daily.    Marland Kitchen KLOR-CON M20 20 MEQ tablet TAKE 1 TABLET BY MOUTH DAILY 90 tablet 1  . lactulose (CHRONULAC) 10 GM/15ML solution Take 15 mLs (10 g total) by mouth daily as needed for mild constipation. 472 mL 0  . Methylcellulose, Laxative, (CITRUCEL PO) Take 2 tablets by mouth daily as needed (constipation).     . Multiple Vitamin (MULTIVITAMIN) tablet Take 1 tablet by mouth daily.      . Omega-3 Fatty Acids (FISH OIL) 1200 MG CAPS Take 1 capsule by mouth daily.     Marland Kitchen omeprazole (PRILOSEC) 20 MG capsule Take 20 mg by mouth daily. Take only on Sun, Tues,Thurs, Sat    . traMADol (ULTRAM) 50 MG tablet Take 50 mg by mouth 3 (three) times daily as needed for moderate pain or severe pain.    . vitamin E 400 UNIT capsule Take 400 Units by mouth daily.      [name] 20 MG TABS tablet TAKE 1 TABLET (20 MG TOTAL) BY MOUTH DAILY WITH SUPPER. 30 tablet 2   No current facility-administered medications for this visit.    Allergies  Allergen Reactions  . Lithostat [Acetohydroxamic  Acid] Other (See Comments)    Unusual tiredness, weakness, nervousness, mental confusion, sore throat, Poor appetite    Social History   Social History  . Marital Status: Widowed    Spouse Name: N/A  . Number of Children: 3  . Years of Education: N/A   Occupational History  . Retired-office work    Social History Main Topics  . Smoking status: Former Smoker    Quit date: 05/22/1986  . Smokeless tobacco: Not on file     Comment: smoked 1944-1988 , up to 1 ppd  . Alcohol Use: No  . Drug Use: No  . Sexual Activity: Not on file   Other Topics Concern    . Not on file   Social History Narrative   Lives by herself, drives        Review of Systems: General: negative for chills, fever, night sweats or weight changes.  Cardiovascular: negative for chest pain, dyspnea on exertion, edema, orthopnea, palpitations, paroxysmal nocturnal dyspnea or shortness of breath Dermatological: negative for rash Respiratory: negative for cough or wheezing Urologic: negative for hematuria Abdominal: negative for nausea, vomiting, diarrhea, bright red blood per rectum, melena, or hematemesis Neurologic: negative for visual changes, syncope, or dizziness All other systems reviewed and are otherwise negative except as noted above.    Blood pressure 110/76, pulse 82, height 5' 2.5" (1.588 m), weight 244 lb 12.8 oz (111.041 kg), SpO2 91 %.  General appearance: alert, cooperative and no distress Neck: no carotid bruit and no JVD Lungs: clear to auscultation bilaterally Heart: regular rate and rhythm, S1, S2 normal, no murmur, click, rub or gallop Extremities: chronic 1+ lower extremity edema 2/2 lymphedema. chronic stasis dermatitis  Pulses: 2+ and symmetric Skin: warm and dry Neurologic: Grossly normal  EKG NSR. 82 bpm   ASSESSMENT AND PLAN:   1. Chronic LEE: secondary to chronic venous edema and lymphedema. Continue daily PO lasix, + ACE compression and elevation.  2. Chronic Diastolic CHF: based on labs, recent presentation was not c/w acute exacerbation. CXR showed no edema and BNP was WNL at 89. Grade I DD noted on echo with normal EF. We discussed continued lasix therapy, BP control and low sodium diet.   3. H/o DVT: LE venous dopplers showed continued occlusive thrombus of the mid femoral vein, distal femoral vein and popliteal vein in LLE from prior DVT diagnosed 08/2014. Continue Xarelto.    PLAN  Continue current medical regimen. Continue routine yearly f/u with Dr. Angelena Form.   Lyda Jester PA-C 04/21/2015 2:13 PM

## 2015-04-26 ENCOUNTER — Other Ambulatory Visit: Payer: Self-pay | Admitting: Internal Medicine

## 2015-04-26 DIAGNOSIS — N183 Chronic kidney disease, stage 3 (moderate): Secondary | ICD-10-CM | POA: Diagnosis not present

## 2015-04-26 DIAGNOSIS — L03116 Cellulitis of left lower limb: Secondary | ICD-10-CM | POA: Diagnosis not present

## 2015-04-26 DIAGNOSIS — I130 Hypertensive heart and chronic kidney disease with heart failure and stage 1 through stage 4 chronic kidney disease, or unspecified chronic kidney disease: Secondary | ICD-10-CM | POA: Diagnosis not present

## 2015-04-26 DIAGNOSIS — I5032 Chronic diastolic (congestive) heart failure: Secondary | ICD-10-CM | POA: Diagnosis not present

## 2015-04-26 DIAGNOSIS — N312 Flaccid neuropathic bladder, not elsewhere classified: Secondary | ICD-10-CM | POA: Diagnosis not present

## 2015-04-26 DIAGNOSIS — L03115 Cellulitis of right lower limb: Secondary | ICD-10-CM | POA: Diagnosis not present

## 2015-04-27 ENCOUNTER — Other Ambulatory Visit: Payer: Self-pay

## 2015-04-27 NOTE — Telephone Encounter (Signed)
Prescription printed, #90 and 2 refill

## 2015-04-27 NOTE — Telephone Encounter (Signed)
Pt is requesting refill on Tramadol.  Last OV: 04/12/2015 Last Fill: 03/16/2015 #90 and 0RF UDS: 07/21/2014 Low risk  Please advise.

## 2015-04-27 NOTE — Telephone Encounter (Signed)
Rx printed, awaiting MD signature.  

## 2015-04-27 NOTE — Patient Outreach (Addendum)
[facility] [facility]) Care Management  04/27/2015  Christina Martin 1926-06-79 [id number]  SUBJECTIVE; Telephone call to patient regarding EMMI heart failure red referral.  Unable to reach patient.  HIPAA compliant voice message left with call back phone number.   PLAN; RNCM will attempt 2nd telephone outreach to patient within 1 week.   Quinn Plowman RN,BSN,CCM [place] Coordinator [phone]

## 2015-04-27 NOTE — Telephone Encounter (Signed)
Rx faxed to [facility] pharmacy.  

## 2015-04-28 ENCOUNTER — Other Ambulatory Visit: Payer: Self-pay | Admitting: Internal Medicine

## 2015-04-28 DIAGNOSIS — I130 Hypertensive heart and chronic kidney disease with heart failure and stage 1 through stage 4 chronic kidney disease, or unspecified chronic kidney disease: Secondary | ICD-10-CM | POA: Diagnosis not present

## 2015-04-28 DIAGNOSIS — N183 Chronic kidney disease, stage 3 (moderate): Secondary | ICD-10-CM | POA: Diagnosis not present

## 2015-04-28 DIAGNOSIS — I5032 Chronic diastolic (congestive) heart failure: Secondary | ICD-10-CM | POA: Diagnosis not present

## 2015-04-28 DIAGNOSIS — N312 Flaccid neuropathic bladder, not elsewhere classified: Secondary | ICD-10-CM | POA: Diagnosis not present

## 2015-04-28 DIAGNOSIS — L03116 Cellulitis of left lower limb: Secondary | ICD-10-CM | POA: Diagnosis not present

## 2015-04-28 DIAGNOSIS — L03115 Cellulitis of right lower limb: Secondary | ICD-10-CM | POA: Diagnosis not present

## 2015-04-29 ENCOUNTER — Other Ambulatory Visit: Payer: Self-pay

## 2015-04-29 NOTE — Patient Outreach (Addendum)
[place] [facility]) Care Management  04/29/2015  Christina Martin 1924-10-17 [id number]  SUBJECTIVE; Telephone call to patient regarding heart failure follow up.  HIPAA verified with patient. Patient states  She is doing very well. Patient denies any increased swelling in extremities and shortness of breath. Patient reports she continues to monitor and record her weights daily. Patient reports weight at 239lbs. Patient states she continues to adhere to a low sodium diet. Patient states she is currently doing her exercises that were given to her by the physical therapist. Patient states she is working very hard to get better. Patient expressed she is glad she is feeling better. RNCM advised patient to continue with her home exercises and discussed adherence to low salt diet.  Patient verbalized understanding.   Patient states she received the [facility] care management packet, consent form, and EMMI education material on heart failure and fall prevention. Patient denies having any questions related to [facility] education material. RNCM requested patient sign consent form and return to [facility] care management. Patient verbalized understanding. Patient request to return to her exercises. Patient verbalized agreement to next telephone call with RNCM.   ASSESSMENT: Heart failure follow up.  Patients weight continues to trend down. Patient expresses working toward this goal.   PLAN: RNCM will follow up with patient within 1 week. RNCM will review heart failure action plan/zones with patient at next outreach.   Quinn Plowman RN,BSN,CCM [place] Coordinator [phone]

## 2015-05-03 DIAGNOSIS — I5032 Chronic diastolic (congestive) heart failure: Secondary | ICD-10-CM | POA: Diagnosis not present

## 2015-05-03 DIAGNOSIS — I130 Hypertensive heart and chronic kidney disease with heart failure and stage 1 through stage 4 chronic kidney disease, or unspecified chronic kidney disease: Secondary | ICD-10-CM | POA: Diagnosis not present

## 2015-05-03 DIAGNOSIS — N183 Chronic kidney disease, stage 3 (moderate): Secondary | ICD-10-CM | POA: Diagnosis not present

## 2015-05-03 DIAGNOSIS — L03116 Cellulitis of left lower limb: Secondary | ICD-10-CM | POA: Diagnosis not present

## 2015-05-03 DIAGNOSIS — L03115 Cellulitis of right lower limb: Secondary | ICD-10-CM | POA: Diagnosis not present

## 2015-05-03 DIAGNOSIS — N312 Flaccid neuropathic bladder, not elsewhere classified: Secondary | ICD-10-CM | POA: Diagnosis not present

## 2015-05-04 DIAGNOSIS — N312 Flaccid neuropathic bladder, not elsewhere classified: Secondary | ICD-10-CM | POA: Diagnosis not present

## 2015-05-05 DIAGNOSIS — L03115 Cellulitis of right lower limb: Secondary | ICD-10-CM | POA: Diagnosis not present

## 2015-05-05 DIAGNOSIS — I130 Hypertensive heart and chronic kidney disease with heart failure and stage 1 through stage 4 chronic kidney disease, or unspecified chronic kidney disease: Secondary | ICD-10-CM | POA: Diagnosis not present

## 2015-05-05 DIAGNOSIS — N183 Chronic kidney disease, stage 3 (moderate): Secondary | ICD-10-CM | POA: Diagnosis not present

## 2015-05-05 DIAGNOSIS — N312 Flaccid neuropathic bladder, not elsewhere classified: Secondary | ICD-10-CM | POA: Diagnosis not present

## 2015-05-05 DIAGNOSIS — L03116 Cellulitis of left lower limb: Secondary | ICD-10-CM | POA: Diagnosis not present

## 2015-05-05 DIAGNOSIS — I5032 Chronic diastolic (congestive) heart failure: Secondary | ICD-10-CM | POA: Diagnosis not present

## 2015-05-06 NOTE — Telephone Encounter (Signed)
Signed forms faxed to [facility] successfully, sent for scanning. JG//CMA  

## 2015-05-10 DIAGNOSIS — L03115 Cellulitis of right lower limb: Secondary | ICD-10-CM | POA: Diagnosis not present

## 2015-05-10 DIAGNOSIS — I130 Hypertensive heart and chronic kidney disease with heart failure and stage 1 through stage 4 chronic kidney disease, or unspecified chronic kidney disease: Secondary | ICD-10-CM | POA: Diagnosis not present

## 2015-05-10 DIAGNOSIS — L03116 Cellulitis of left lower limb: Secondary | ICD-10-CM | POA: Diagnosis not present

## 2015-05-10 DIAGNOSIS — N312 Flaccid neuropathic bladder, not elsewhere classified: Secondary | ICD-10-CM | POA: Diagnosis not present

## 2015-05-10 DIAGNOSIS — N183 Chronic kidney disease, stage 3 (moderate): Secondary | ICD-10-CM | POA: Diagnosis not present

## 2015-05-10 DIAGNOSIS — I5032 Chronic diastolic (congestive) heart failure: Secondary | ICD-10-CM | POA: Diagnosis not present

## 2015-05-11 DIAGNOSIS — I130 Hypertensive heart and chronic kidney disease with heart failure and stage 1 through stage 4 chronic kidney disease, or unspecified chronic kidney disease: Secondary | ICD-10-CM | POA: Diagnosis not present

## 2015-05-11 DIAGNOSIS — N183 Chronic kidney disease, stage 3 (moderate): Secondary | ICD-10-CM | POA: Diagnosis not present

## 2015-05-11 DIAGNOSIS — I5032 Chronic diastolic (congestive) heart failure: Secondary | ICD-10-CM | POA: Diagnosis not present

## 2015-05-11 DIAGNOSIS — L03115 Cellulitis of right lower limb: Secondary | ICD-10-CM | POA: Diagnosis not present

## 2015-05-11 DIAGNOSIS — L03116 Cellulitis of left lower limb: Secondary | ICD-10-CM | POA: Diagnosis not present

## 2015-05-11 DIAGNOSIS — N312 Flaccid neuropathic bladder, not elsewhere classified: Secondary | ICD-10-CM | POA: Diagnosis not present

## 2015-05-12 DIAGNOSIS — L03116 Cellulitis of left lower limb: Secondary | ICD-10-CM | POA: Diagnosis not present

## 2015-05-12 DIAGNOSIS — L03115 Cellulitis of right lower limb: Secondary | ICD-10-CM | POA: Diagnosis not present

## 2015-05-12 DIAGNOSIS — N312 Flaccid neuropathic bladder, not elsewhere classified: Secondary | ICD-10-CM | POA: Diagnosis not present

## 2015-05-12 DIAGNOSIS — I5032 Chronic diastolic (congestive) heart failure: Secondary | ICD-10-CM | POA: Diagnosis not present

## 2015-05-12 DIAGNOSIS — N183 Chronic kidney disease, stage 3 (moderate): Secondary | ICD-10-CM | POA: Diagnosis not present

## 2015-05-12 DIAGNOSIS — I130 Hypertensive heart and chronic kidney disease with heart failure and stage 1 through stage 4 chronic kidney disease, or unspecified chronic kidney disease: Secondary | ICD-10-CM | POA: Diagnosis not present

## 2015-05-19 DIAGNOSIS — N312 Flaccid neuropathic bladder, not elsewhere classified: Secondary | ICD-10-CM | POA: Diagnosis not present

## 2015-05-19 DIAGNOSIS — L03116 Cellulitis of left lower limb: Secondary | ICD-10-CM | POA: Diagnosis not present

## 2015-05-19 DIAGNOSIS — I130 Hypertensive heart and chronic kidney disease with heart failure and stage 1 through stage 4 chronic kidney disease, or unspecified chronic kidney disease: Secondary | ICD-10-CM | POA: Diagnosis not present

## 2015-05-19 DIAGNOSIS — N183 Chronic kidney disease, stage 3 (moderate): Secondary | ICD-10-CM | POA: Diagnosis not present

## 2015-05-19 DIAGNOSIS — L03115 Cellulitis of right lower limb: Secondary | ICD-10-CM | POA: Diagnosis not present

## 2015-05-19 DIAGNOSIS — I5032 Chronic diastolic (congestive) heart failure: Secondary | ICD-10-CM | POA: Diagnosis not present

## 2015-05-21 ENCOUNTER — Ambulatory Visit (INDEPENDENT_AMBULATORY_CARE_PROVIDER_SITE_OTHER): Payer: Medicare Other | Admitting: Internal Medicine

## 2015-05-21 DIAGNOSIS — R0609 Other forms of dyspnea: Secondary | ICD-10-CM

## 2015-05-21 LAB — PULMONARY FUNCTION TEST
DL/VA % pred: 81 %
DL/VA: 3.69 ml/min/mmHg/L
DLCO UNC: 11.97 ml/min/mmHg
DLCO unc % pred: 55 %
FEF 25-75 Post: 0.56 L/sec
FEF 25-75 Pre: 0.41 L/sec
FEF2575-%Change-Post: 37 %
FEF2575-%PRED-PRE: 48 %
FEF2575-%Pred-Post: 67 %
FEV1-%Change-Post: 11 %
FEV1-%PRED-POST: 61 %
FEV1-%PRED-PRE: 55 %
FEV1-PRE: 0.8 L
FEV1-Post: 0.89 L
FEV1FVC-%Change-Post: 4 %
FEV1FVC-%Pred-Pre: 87 %
FEV6-%Change-Post: 7 %
FEV6-%PRED-POST: 72 %
FEV6-%PRED-PRE: 67 %
FEV6-POST: 1.33 L
FEV6-Pre: 1.24 L
FEV6FVC-%CHANGE-POST: 0 %
FEV6FVC-%PRED-POST: 106 %
FEV6FVC-%Pred-Pre: 106 %
FVC-%Change-Post: 6 %
FVC-%PRED-PRE: 63 %
FVC-%Pred-Post: 67 %
FVC-POST: 1.34 L
FVC-PRE: 1.26 L
POST FEV6/FVC RATIO: 99 %
PRE FEV1/FVC RATIO: 63 %
PRE FEV6/FVC RATIO: 99 %
Post FEV1/FVC ratio: 66 %
RV % pred: 316 %
RV: 7.82 L
TLC % PRED: 199 %
TLC: 9.47 L

## 2015-05-21 NOTE — Progress Notes (Signed)
PFT done today. 05/21/2015 

## 2015-05-25 DIAGNOSIS — L03116 Cellulitis of left lower limb: Secondary | ICD-10-CM | POA: Diagnosis not present

## 2015-05-25 DIAGNOSIS — N183 Chronic kidney disease, stage 3 (moderate): Secondary | ICD-10-CM | POA: Diagnosis not present

## 2015-05-25 DIAGNOSIS — L03115 Cellulitis of right lower limb: Secondary | ICD-10-CM | POA: Diagnosis not present

## 2015-05-25 DIAGNOSIS — I130 Hypertensive heart and chronic kidney disease with heart failure and stage 1 through stage 4 chronic kidney disease, or unspecified chronic kidney disease: Secondary | ICD-10-CM | POA: Diagnosis not present

## 2015-05-25 DIAGNOSIS — I5032 Chronic diastolic (congestive) heart failure: Secondary | ICD-10-CM | POA: Diagnosis not present

## 2015-05-25 DIAGNOSIS — N312 Flaccid neuropathic bladder, not elsewhere classified: Secondary | ICD-10-CM | POA: Diagnosis not present

## 2015-06-01 MED ORDER — TIOTROPIUM BROMIDE MONOHYDRATE 18 MCG IN CAPS: 18.0000 ug | ORAL_CAPSULE | Freq: Every day | RESPIRATORY_TRACT | Status: DC

## 2015-06-01 NOTE — Addendum Note (Signed)
Addended byDamita Dunnings D on: 06/01/2015 10:39 AM   Modules accepted: Orders

## 2015-06-08 DIAGNOSIS — N210 Calculus in bladder: Secondary | ICD-10-CM | POA: Diagnosis not present

## 2015-06-08 DIAGNOSIS — Z87448 Personal history of other diseases of urinary system: Secondary | ICD-10-CM | POA: Diagnosis not present

## 2015-06-11 ENCOUNTER — Telehealth: Payer: Self-pay | Admitting: Internal Medicine

## 2015-06-11 MED ORDER — INCRUSE ELLIPTA 62.5 MCG/INH IN AEPB: 1.0000 | INHALATION_SPRAY | Freq: Every day | RESPIRATORY_TRACT | Status: DC

## 2015-06-11 NOTE — Telephone Encounter (Signed)
Notified Paige. She will call us back.

## 2015-06-11 NOTE — Telephone Encounter (Signed)
Patient's daughter Arby Barrette) called back to inform Dr. Larose Kells that patient's insurance company stated that they will cover Incruse ELPT inhaler instead of Spiriva Inhaler

## 2015-06-11 NOTE — Telephone Encounter (Signed)
Spiriva d/c, Incruse Ellipta sent to [facility] on [address].

## 2015-06-11 NOTE — Telephone Encounter (Signed)
Please advise. Okay to change to Incruse Ellipta?

## 2015-06-11 NOTE — Telephone Encounter (Signed)
°  Ph[id number] - [place], [place].  Reason for call: Insurance not covering Spiriva. Pt would like alt med sent in.

## 2015-06-11 NOTE — Telephone Encounter (Signed)
Pt's daughter will need to call insurance to see what alternative is, we have no way of knowing this information.

## 2015-06-11 NOTE — Telephone Encounter (Signed)
Okay to change to Incruise Ellipta 1 puff daily, okay to refill 3 months and 3 refills if necessary

## 2015-06-16 ENCOUNTER — Ambulatory Visit: Payer: Self-pay

## 2015-06-29 ENCOUNTER — Other Ambulatory Visit: Payer: Self-pay | Admitting: Hematology & Oncology

## 2015-07-01 ENCOUNTER — Other Ambulatory Visit: Payer: Self-pay

## 2015-07-01 NOTE — Patient Outreach (Signed)
[place] [facility]) Care Management  07/01/2015  Christina Martin [birth year] [id number]   SUBJECTIVE; Telephone call to patient regarding heart failure follow up. HIPAA  Verified with patient. HIPAA verified with patient. Patient states she is doing very well.  Patient denies any abnormal swelling in lower extremities, shortness of breath, or weight gain. Patient reports she is in the Javi Bollman zone of her heart failure action plan.   Patient states he appetite has improved. Patient reports her weight has increase some due to her having a better appetite. Patient states she has an upcoming appointment with her primary MD.  States she is unsure of the exact date due to her daughter keeping up with her appointments. Patient states she continues to do the home exercises that was provided by her therapist. Patient states she does home exercises approximately 4-5 times per week. Patient states she does not have any additional compliant.  Patient states she feels like she is back to her "old self."  RNCM advised patient to continue to take her medications as prescribed.  RNCM advised patient to report any heart failure symptoms to her doctor as indicated.  RNCM advised patient to keep follow up appointments with her doctor.  Patient verbalized understanding. Patient verbalized agreement to have follow up call with RNCM.   ASSESSMENT: Congestive heart failure management/education  PLAN: RNCM will follow up with patient within 1 month.   Quinn Plowman RN,BSN,CCM [facility] Telephonic  [phone]

## 2015-07-06 DIAGNOSIS — N312 Flaccid neuropathic bladder, not elsewhere classified: Secondary | ICD-10-CM | POA: Diagnosis not present

## 2015-07-09 ENCOUNTER — Other Ambulatory Visit: Payer: Self-pay

## 2015-07-09 NOTE — Patient Outreach (Signed)
[facility]) Care Management  [place]  07/09/2015   Christina Martin 1926/02/80 [id number]  Subjective:  Telephone call to patient regarding heart failure follow up.  HIPAA verified with patient. Patient states, "I'm doing really well." Patient reports her weight at 223lbs today.  Patient states she lost a lot of fluid yesterday. Patient reports she is taking her medications and fluid pill as prescribed by her doctor.  Patient states, "some days I pass more water than others."  Patient denied having shortness of breath or swelling. Patient reports she is in the Romi Rathel zone today. Patient states she keeps her heart failure action plan/zones list on her refrigerator.  RNCM advised patient to continue to take her medication as prescribed. RNCM reviewed heart failure symptoms with patient. Advised to contact doctor increased symptoms (yellow zone symptoms) RNCM discussed low sodium food examples with patient.  RNCM advised patient to continue monitor weight and record daily.  Patient verbalized understanding.      Objective: n/a  Current Medications:  Current Outpatient Prescriptions  Medication Sig Dispense Refill  . acetaminophen (TYLENOL) 325 MG tablet Take 650 mg by mouth every 6 (six) hours as needed (pain).     . benazepril (LOTENSIN) 40 MG tablet Take 1 tablet (40 mg total) by mouth daily. 90 tablet 2  . Calcium Carbonate-Vitamin D (CALCIUM 600 + D PO) Take 2 capsules by mouth daily. \    . Docusate Calcium (STOOL SOFTENER PO) Take 1 capsule by mouth daily.     . furosemide (LASIX) 40 MG tablet Take 40 mg by mouth 2 (two) times daily.    Marland Kitchen KLOR-CON M20 20 MEQ tablet TAKE 1 TABLET BY MOUTH DAILY 90 tablet 1  . Multiple Vitamin (MULTIVITAMIN) tablet Take 1 tablet by mouth daily.      . Omega-3 Fatty Acids (FISH OIL) 1200 MG CAPS Take 1 capsule by mouth daily.     Marland Kitchen omeprazole (PRILOSEC) 20 MG capsule Take 20 mg by mouth daily. Take only on Sun, Tues,Thurs, Sat     . traMADol (ULTRAM) 50 MG tablet TAKE 1 TABLET THREE TIMES A DAY AS NEEDED 90 tablet 2  . vitamin E 400 UNIT capsule Take 400 Units by mouth daily.      [name] 20 MG TABS tablet TAKE 1 TABLET (20 MG TOTAL) BY MOUTH DAILY WITH SUPPER. 30 tablet 2  . INCRUSE ELLIPTA 62.5 MCG/INH AEPB Inhale 1 puff into the lungs daily. 30 each 3  . lactulose (CHRONULAC) 10 GM/15ML solution Take 15 mLs (10 g total) by mouth daily as needed for mild constipation. (Patient not taking: Reported on 07/09/2015) 472 mL 0  . Methylcellulose, Laxative, (CITRUCEL PO) Take 2 tablets by mouth daily as needed (constipation). Reported on 07/09/2015     No current facility-administered medications for this visit.    Functional Status:  In your present state of health, do you have any difficulty performing the following activities: 04/21/2015 03/26/2015  Hearing? Y N  Vision? N N  Difficulty concentrating or making decisions? N N  Walking or climbing stairs? Y Y  Dressing or bathing? Y N  Doing errands, shopping? Tempie Donning  Preparing Food and eating ? Y -  Using the Toilet? N -  In the past six months, have you accidently leaked urine? N -  Do you have problems with loss of bowel control? N -  Managing your Medications? N -  Managing your Finances? N -  Housekeeping or managing your  Housekeeping? Y -    Fall/Depression Screening: PHQ 2/9 Scores 04/21/2015 04/05/2015 07/21/2014 05/21/2013 03/22/2012  PHQ - 2 Score 0 0 0 1 0   Fall Risk  07/01/2015 04/21/2015 04/21/2015 04/07/2015 01/19/2015  Falls in the past year? Yes - Yes No No  Number falls in past yr: - - 1 - -  Injury with Fall? - - No - -  Risk for fall due to : History of fall(s) - History of fall(s) - -  Follow up - (No Data) Education provided - -   Assessment: Heart failure follow up.  Patient continues to adhere to plan of care.  Patient continues to weigh daily and record. Patient verbalizes understanding of heart failure action plan and zones.   Plan: RNCM  will follow up with patient within 1 month. RNCM will send patients primary MD involvement letter update.  Quinn Plowman RN,BSN,CCM [facility] Telephonic  [phone]

## 2015-07-13 ENCOUNTER — Encounter: Payer: Self-pay | Admitting: Internal Medicine

## 2015-07-13 ENCOUNTER — Ambulatory Visit (INDEPENDENT_AMBULATORY_CARE_PROVIDER_SITE_OTHER): Payer: Medicare Other | Admitting: Internal Medicine

## 2015-07-13 VITALS — BP 98/60 | HR 74 | Temp 98.2°F | Ht 62.5 in | Wt 222.2 lb

## 2015-07-13 DIAGNOSIS — I5032 Chronic diastolic (congestive) heart failure: Secondary | ICD-10-CM

## 2015-07-13 DIAGNOSIS — R7303 Prediabetes: Secondary | ICD-10-CM

## 2015-07-13 DIAGNOSIS — I519 Heart disease, unspecified: Secondary | ICD-10-CM | POA: Diagnosis not present

## 2015-07-13 DIAGNOSIS — J439 Emphysema, unspecified: Secondary | ICD-10-CM

## 2015-07-13 DIAGNOSIS — Z09 Encounter for follow-up examination after completed treatment for conditions other than malignant neoplasm: Secondary | ICD-10-CM

## 2015-07-13 DIAGNOSIS — R739 Hyperglycemia, unspecified: Secondary | ICD-10-CM

## 2015-07-13 NOTE — Progress Notes (Signed)
Pre visit review using our clinic review tool, if applicable. No additional management support is needed unless otherwise documented below in the visit note. 

## 2015-07-13 NOTE — Progress Notes (Signed)
Subjective:    Patient ID: Christina Martin, female    DOB: February 18, 1925, 82 y.o.   MRN: [id number]  DOS:  07/13/2015 Type of visit - description : Here with her daughter, routine checkup Interval history: Good compliance with medications, feels really well. BP today is low, she is asymptomatic, no recent ambulatory BPs. PFTs results reviewed, she was prescribed inhaler, feeling great but wonders if she really needs it  Wt Readings from Last 3 Encounters:  07/13/15 222 lb 3.2 oz (100.789 kg)  07/01/15 227 lb (102.967 kg)  04/21/15 244 lb 12.8 oz (111.041 kg)     Review of Systems Denies chest pain, difficulty breathing. No wheezing. No nausea, vomiting, diarrhea. No cough  Past Medical History  Diagnosis Date  . Nonspecific elevation of levels of transaminase or lactic acid dehydrogenase (LDH) 1996    + Hepatitis srology  . GERD (gastroesophageal reflux disease)   . HTN (hypertension) 12/30/2007  . Diastolic dysfunction, left ventricle 05/30/2013    05/2013 see ECHO   . DJD (degenerative joint disease)   . Uterine cancer  (h/O)     no recent f/u   . ATONIC BLADDER-- has a permananet suprapubic catheter    . Prediabetes 04/07/2014  . Calculus in urethra     Dr. Diona Fanti    Past Surgical History  Procedure Laterality Date  . Esophageal dilation  2006  . Total knee arthroplasty  1996 & 2002  . Appendectomy    . Total abdominal hysterectomy w/ bilateral salpingoophorectomy  1990    uterine cancer  . Colonoscopy w/ polypectomy  1998    Tics in 2006; Dr Sharlett Iles  . Sbo  1991    adhesions resected  . Posterior laminectomy / decompression lumbar spine  2009    Dr Lorin Mercy  . Cataract surgery  2014    bilateral; Dr Prudencio Burly    Social History   Social History  . Marital Status: Widowed    Spouse Name: N/A  . Number of Children: 3  . Years of Education: N/A   Occupational History  . Retired-office work    Social History Main Topics  . Smoking status: Former Smoker   Quit date: 05/22/1986  . Smokeless tobacco: Not on file     Comment: smoked 1944-1988 , up to 1 ppd  . Alcohol Use: No  . Drug Use: No  . Sexual Activity: Not on file   Other Topics Concern  . Not on file   Social History Narrative   Lives by herself, drives           Medication List       This list is accurate as of: 07/13/15 11:59 PM.  Always use your most recent med list.               acetaminophen 325 MG tablet  Commonly known as:  TYLENOL  Take 650 mg by mouth every 6 (six) hours as needed (pain).     benazepril 40 MG tablet  Commonly known as:  LOTENSIN  Take 1 tablet (40 mg total) by mouth daily.     CALCIUM 600 + D PO  Take 2 capsules by mouth daily. \     CITRUCEL PO  Take 2 tablets by mouth daily as needed (constipation). Reported on 07/09/2015     Fish Oil 1200 MG Caps  Take 1 capsule by mouth daily.     furosemide 40 MG tablet  Commonly known as:  LASIX  Take 40  mg by mouth 2 (two) times daily.     INCRUSE ELLIPTA 62.5 MCG/INH Aepb  Generic drug:  umeclidinium bromide  Inhale 1 puff into the lungs daily.     KLOR-CON M20 20 MEQ tablet  Generic drug:  potassium chloride SA  TAKE 1 TABLET BY MOUTH DAILY     lactulose 10 GM/15ML solution  Commonly known as:  CHRONULAC  Take 15 mLs (10 g total) by mouth daily as needed for mild constipation.     multivitamin tablet  Take 1 tablet by mouth daily.     omeprazole 20 MG capsule  Commonly known as:  PRILOSEC  Take 20 mg by mouth daily. Take only on Sun, Tues,Thurs, Sat     sodium chloride irrigation 0.9 % irrigation  Irrigate with as directed once.     STOOL SOFTENER PO  Take 1 capsule by mouth daily.     traMADol 50 MG tablet  Commonly known as:  ULTRAM  TAKE 1 TABLET THREE TIMES A DAY AS NEEDED     vitamin E 400 UNIT capsule  Take 400 Units by mouth daily.     XARELTO 20 MG Tabs tablet  Generic drug:  rivaroxaban  TAKE 1 TABLET (20 MG TOTAL) BY MOUTH DAILY WITH SUPPER.            Objective:   Physical Exam BP 98/60 mmHg  Pulse 74  Temp(Src) 98.2 F (36.8 C) (Oral)  Ht 5' 2.5" (1.588 m)  Wt 222 lb 3.2 oz (100.789 kg)  BMI 39.97 kg/m2  SpO2 95% General:   Well developed, well nourished . NAD.  HEENT:  Normocephalic . Face symmetric, atraumatic Lungs:  CTA B Normal respiratory effort, no intercostal retractions, no accessory muscle use. Heart: RRR,  no murmur.  Mild pretibial edema bilaterally , left calf is a slightly larger in diameter about 1 cm Skin: Some erythema at the pretibial areas without warmness or tenderness. Neurologic:  alert & oriented X3.  Speech normal, gait appropriate for age and unassisted Psych--  Cognition and judgment appear intact.  Cooperative with normal attention span and concentration.  Behavior appropriate. No anxious or depressed appearing.      Assessment & Plan:   Assessment>  Prediabetes w/ neuropathy ( + sx and decreased pinprick examination 03-2015) Mild renal failure, creatinine 1.4 baseline  HTN DJD -- tramadol prn  GI: --GERD  --H/o Esophageal stricture  L leg DVT 08-2014, saw hematology 12-2014, idiopathic, rec   xarelto x 1 year CHF (diastolic)---echo [id number] diastolic dysfx, LVH, nl EF Dr Angelena Form -- dry weight ~ 250 Emphysema. PFTs 05-2015 Atonic  bladder, permanent suprapubic catheter Renal stones h/oUterine cancer  TAH/BSO (1990s) H/p Elevated LFTs (1990s, h/o  + hep serology) Stasis dermatitis  H/o admitted 03-2015 with edema, felt not to be CHF   PLAN Prediabetes: Check A1c, reports she is doing very well with diet and has lost some weight. CHF: She has lost a significant amount of weight, she is below ideal weight of 250. BP is a slightly low, will check a BMP. As long as she is feeling well, has no symptoms of low bp and her creatinine is a stable we'll continue with the same doses of benazepril, Lasix, potassium. Emphysema per PFTs 05-2015, exertional wheezing improved,  could be from using the  inhaler (incruise)  or b/c her CHF status has improved. She likes to hold incruise I see how she's doing. I agree, if wheezing resurface she will go back on the inhaler  RTC 3 months, CPX

## 2015-07-13 NOTE — Patient Instructions (Addendum)
GO TO THE LAB : Get the blood work    GO TO THE FRONT DESK  Schedule a complete physical exam to be done in 3 months  Please be fasting    Check the  blood pressure 2  times a month  Be sure your blood pressure is between 110/65 and  145/85. If it is consistently higher or lower, let me know    Hold INCRUISE

## 2015-07-14 LAB — BASIC METABOLIC PANEL
BUN: 25 mg/dL — ABNORMAL HIGH (ref 6–23)
CHLORIDE: 98 meq/L (ref 96–112)
CO2: 32 mEq/L (ref 19–32)
CREATININE: 1.67 mg/dL — AB (ref 0.40–1.20)
Calcium: 9.8 mg/dL (ref 8.4–10.5)
GFR: 30.71 mL/min — ABNORMAL LOW (ref >60.00)
Glucose, Bld: 84 mg/dL (ref 70–99)
Potassium: 4.4 mEq/L (ref 3.5–5.1)
SODIUM: 137 meq/L (ref 135–145)

## 2015-07-14 LAB — HEMOGLOBIN A1C: HEMOGLOBIN A1C: 5.9 % (ref 4.6–6.5)

## 2015-07-15 NOTE — Assessment & Plan Note (Signed)
Prediabetes: Check A1c, reports she is doing very well with diet and has lost some weight. CHF: She has lost a significant amount of weight, she is below ideal weight of 250. BP is a slightly low, will check a BMP. As long as she is feeling well, has no symptoms of low bp and her creatinine is a stable we'll continue with the same doses of benazepril, Lasix, potassium. Emphysema per PFTs 05-2015, exertional wheezing improved,  could be from using the inhaler (incruise)  or b/c her CHF status has improved. She likes to hold incruise I see how she's doing. I agree, if wheezing resurface she will go back on the inhaler RTC 3 months, CPX

## 2015-07-29 ENCOUNTER — Other Ambulatory Visit: Payer: Self-pay

## 2015-07-29 NOTE — Patient Outreach (Addendum)
[facility]) Care Management  07/29/2015  Christina Martin February 14, 1925 [id number]  SUBJECTIVE: Telephone call to patient for follow up. HIPAA verified with patient. Patient reports she is doing very well. Patient states she followed up with her doctor in February. Patient states her doctor gave her an inhaler, Incruise, to use short term because she had a slight wheeze. Patient states the doctor said she can determine how long she needs to use it.  Patient states the wheezing has resolved and she has stopped using the inhaler. Patient states she is taking all of her other medications as prescribed.  Patient states she continues to weigh daily. Patient reports today's weight is 225.  Patient states her weight has gone up a few pounds. Patient states the weight gain is due to her eating a little more and not due to fluid. Patient denies any swelling in her feet and ankles. Patient denies any shortness of breath. Patient states she continues to do the exercises that the physical therapist gave her. Patient states she does the exercises daily and states she has started walking more. Patient states she continues to do well. RNCM reviewed heart failure action plan/zones with patient. Patient reports she is in the [name] zone today.  Patient verbally agreed to next follow up telephone outreach with [facility].   ASSESSMENT:  Telephonic follow up for congestive heart failure.  Patient continues to self manage.  Patient would benefit from ongoing reinforcement and support from [facility].  PLAN: RNCM will follow up with patient within 1 month.   Christina Plowman RN,BSN,CCM [facility] Telephonic  [phone]

## 2015-08-03 DIAGNOSIS — N210 Calculus in bladder: Secondary | ICD-10-CM | POA: Diagnosis not present

## 2015-08-03 DIAGNOSIS — N312 Flaccid neuropathic bladder, not elsewhere classified: Secondary | ICD-10-CM | POA: Diagnosis not present

## 2015-08-10 ENCOUNTER — Other Ambulatory Visit: Payer: Self-pay | Admitting: Internal Medicine

## 2015-08-11 NOTE — Telephone Encounter (Signed)
Pt is requesting refill on Tramadol.  Last OV: 07/13/2015 Last Fill: 04/27/2015 #90 and 2RF Pt sig: 1 tablet tid PRN UDS: 07/21/2014 Low risk  Please advise.

## 2015-08-11 NOTE — Telephone Encounter (Signed)
Okay #90, one refill 

## 2015-08-11 NOTE — Telephone Encounter (Signed)
Rx printed, awaiting MD signature.  

## 2015-08-11 NOTE — Telephone Encounter (Signed)
Rx faxed to [facility] pharmacy.  

## 2015-08-14 ENCOUNTER — Other Ambulatory Visit: Payer: Self-pay | Admitting: Cardiovascular Disease

## 2015-08-30 ENCOUNTER — Ambulatory Visit: Payer: Self-pay

## 2015-08-30 DIAGNOSIS — N210 Calculus in bladder: Secondary | ICD-10-CM | POA: Diagnosis not present

## 2015-08-31 ENCOUNTER — Other Ambulatory Visit: Payer: Self-pay

## 2015-08-31 NOTE — Patient Outreach (Signed)
[facility]) Care Management  Christina Martin  08/31/2015   Christina Martin [birth year] [id number]  Subjective: Telephone call to patient regarding heart failure follow up.  HIPAA verified with patient. Patient states she is doing very well.  Patient denies any shortness of breath, swelling in extremities or unusual weight gain. Patient states she continues to monitor her salt intake by eating fresh or frozen vegetables, not adding any salt to her foods, and if purchases can vegetables/beans buys salt free. Patient states she followed up with her urologist on yesterday 08/30/15. Patient states check up went well and no changes to her plan of care.  Patient states she is aware of signs of heart failure and reports she is in the Christina Martin zone today.  RNCM advised patient to continue to take her medications as prescribed. RNCM reviewed signs/symptoms of heart failure with patient.  RNCM reviewed heart failure zones with patient.  RNCM advised patient to contact her doctor for a weight gain of 3 lbs overnight or 5 lbs within a week. RNCM advised patient to keep scheduled doctor appointments RNCM advised patient to continue to adhere to low sodium diet.  Patient verbalized understanding.  Patient verbally agreed to next telephone outreach appointment with [facility].   Objective: n/a  Encounter Medications:  Outpatient Encounter Prescriptions as of 08/31/2015  Medication Sig  . acetaminophen (TYLENOL) 325 MG tablet Take 650 mg by mouth every 6 (six) hours as needed (pain).   . benazepril (LOTENSIN) 40 MG tablet Take 1 tablet (40 mg total) by mouth daily.  . Calcium Carbonate-Vitamin D (CALCIUM 600 + D PO) Take 2 capsules by mouth daily. \  . Docusate Calcium (STOOL SOFTENER PO) Take 1 capsule by mouth daily.   . furosemide (LASIX) 40 MG tablet Take 40 mg by mouth 2 (two) times daily.  . furosemide (LASIX) 80 MG tablet TAKE 1 TABLET (80 MG TOTAL) BY MOUTH 2 (TWO) TIMES DAILY.  Marland Kitchen KLOR-CON  M20 20 MEQ tablet TAKE 1 TABLET BY MOUTH DAILY  . lactulose (CHRONULAC) 10 GM/15ML solution Take 15 mLs (10 g total) by mouth daily as needed for mild constipation.  . Methylcellulose, Laxative, (CITRUCEL PO) Take 2 tablets by mouth daily as needed (constipation). Reported on 07/09/2015  . Multiple Vitamin (MULTIVITAMIN) tablet Take 1 tablet by mouth daily.    . Omega-3 Fatty Acids (FISH OIL) 1200 MG CAPS Take 1 capsule by mouth daily.   Marland Kitchen omeprazole (PRILOSEC) 20 MG capsule Take 20 mg by mouth daily. Take only on Sun, Tues,Thurs, Sat  . sodium chloride irrigation 0.9 % irrigation Irrigate with as directed once.  . traMADol (ULTRAM) 50 MG tablet Take 1 tablet (50 mg total) by mouth 3 (three) times daily as needed.  . vitamin E 400 UNIT capsule Take 400 Units by mouth daily.    [name] 20 MG TABS tablet TAKE 1 TABLET (20 MG TOTAL) BY MOUTH DAILY WITH SUPPER.  Marland Kitchen INCRUSE ELLIPTA 62.5 MCG/INH AEPB Inhale 1 puff into the lungs daily. (Patient not taking: Reported on 07/29/2015)   No facility-administered encounter medications on file as of 08/31/2015.      Assessment: Congestive heart failure follow up.   Plan: RNCM will follow up with patient within 1 month.  Patient will report any change in plan of care at next outreach. Patient will report any follow up appointments with doctors at next outreach. Patient will be able to report at least 3 symptoms in yellow zone of heart failure action  plan.   Christina Plowman RN,BSN,CCM [facility] Telephonic  [phone]

## 2015-09-01 ENCOUNTER — Encounter: Payer: Self-pay | Admitting: Internal Medicine

## 2015-09-01 ENCOUNTER — Ambulatory Visit (INDEPENDENT_AMBULATORY_CARE_PROVIDER_SITE_OTHER): Payer: Medicare Other | Admitting: Internal Medicine

## 2015-09-01 VITALS — BP 126/78 | HR 78 | Temp 97.8°F | Ht 62.5 in | Wt 225.2 lb

## 2015-09-01 DIAGNOSIS — J439 Emphysema, unspecified: Secondary | ICD-10-CM | POA: Diagnosis not present

## 2015-09-01 DIAGNOSIS — I8311 Varicose veins of right lower extremity with inflammation: Secondary | ICD-10-CM | POA: Diagnosis not present

## 2015-09-01 DIAGNOSIS — Z09 Encounter for follow-up examination after completed treatment for conditions other than malignant neoplasm: Secondary | ICD-10-CM

## 2015-09-01 DIAGNOSIS — I519 Heart disease, unspecified: Secondary | ICD-10-CM

## 2015-09-01 DIAGNOSIS — I8312 Varicose veins of left lower extremity with inflammation: Secondary | ICD-10-CM | POA: Diagnosis not present

## 2015-09-01 DIAGNOSIS — I872 Venous insufficiency (chronic) (peripheral): Secondary | ICD-10-CM

## 2015-09-01 MED ORDER — BETAMETHASONE DIPROPIONATE AUG 0.05 % EX CREA: TOPICAL_CREAM | Freq: Two times a day (BID) | CUTANEOUS | Status: AC

## 2015-09-01 MED ORDER — CEPHALEXIN 500 MG PO CAPS: 500.0000 mg | ORAL_CAPSULE | Freq: Four times a day (QID) | ORAL | Status: DC

## 2015-09-01 NOTE — Progress Notes (Signed)
Pre visit review using our clinic review tool, if applicable. No additional management support is needed unless otherwise documented below in the visit note. 

## 2015-09-01 NOTE — Assessment & Plan Note (Signed)
Stasis dermatitis: Findings likely d/t stasis dermatitis, early cellulitis?. Leg elevation, topical steroids, if not better to use Keflex, see instructions. CHF: On Lasix 40 mg twice a day, weight stable. Emphysema: Not using incruize,, doing well. RTC, May as already scheduled

## 2015-09-01 NOTE — Patient Instructions (Addendum)
Leg elevation, cold compresses   Use the cream twice a day for 3 or 4 days,  If you get worse, high fever, chills or any discharge: Start antibiotics as prescribed, Keflex.   Stasis Dermatitis Stasis dermatitis occurs when veins lose the ability to pump blood back to the heart (poor venous circulation). It causes a reddish-purple to brownish scaly, itchy rash on the legs. The rash comes from pooling of blood (stasis). CAUSES  This occurs because the veins do not work very well anymore or because pressure may be increased in the veins due to other conditions. With blood pooling, the increased pressure in the tiny blood vessels (capillaries) causes fluid to leak out of the capillaries into the tissue. The extra fluid makes it harder for the blood to feed the cells and get rid of waste products. SYMPTOMS  Stasis dermatitis appears as red, scaly, itchy patches on the legs. A yellowish or light brown discoloration is also present. Due to scratching or other injury, these patches can become an ulcer. This ulcer may remain for long periods of time. The ulcer can also become infected. Swelling of the legs is often present with stasis dermatitis. If the leg is swollen, this increases the risk of infection and further damage to the skin. Sometimes, intense itching, tingling, and burning occurs before signs of stasis dermatitis appear. You may find yourself scratching the insides of your ankles or rubbing your ankles together before the rash appears. After healing, there are often brown spots on the affected skin. DIAGNOSIS  Your caregiver makes this diagnosis based on an exam. Other tests may be done to better understand the cause. TREATMENT  If underlying conditions are present, they must be treated. Some of these conditions are heart failure, thyroid problems, poor nutrition, and varicose veins.  Cortisone creams and ointments applied to the skin (topically) may be needed, as well as medicine to reduce  swelling in the legs (diuretics).  Compression stockings or an elastic wrap may also be needed to reduce swelling.  If there is an infection, antibiotic medicines may also be used. HOME CARE INSTRUCTIONS   Try to rest and raise (elevate) the affected leg above the level of the heart, if possible.  Burow's solution wet packs applied for 30 minutes, 3 times daily, will help the weepy rash. Stop using the packs before your skin gets too dry. You can also use a mixture of 3 parts white vinegar to 1 quart water.  Grease your legs daily with ointments, such as petroleum jelly, to fight dryness.  Avoid scratching or injuring the area. SEEK IMMEDIATE MEDICAL CARE IF:   Your rash gets worse.  An ulcer forms.  You have an oral temperature above 102 F (38.9 C), not controlled by medicine.  You have any other severe symptoms.   This information is not intended to replace advice given to you by your health care provider. Make sure you discuss any questions you have with your health care provider.   Document Released: 08/17/2005 Document Revised: 07/31/2011 Document Reviewed: 09/23/2014 Elsevier Interactive Patient Education [employer].

## 2015-09-01 NOTE — Progress Notes (Signed)
Subjective:    Patient ID: Christina Martin, female    DOB: June 22, 1924, 82 y.o.   MRN: [id number]  DOS:  09/01/2015 Type of visit - description : Acute visit Interval history: Has a chronic rash at the  pretibial area but in the last few days has been more red .+ Severe itching. No pain or blisters  Wt Readings from Last 3 Encounters:  09/01/15 225 lb 4 oz (102.173 kg)  08/31/15 228 lb (103.42 kg)  07/29/15 225 lb (102.059 kg)    Review of Systems Denies fever chills Good compliance of medication, weight  at  home is stable. No wheezing  Past Medical History  Diagnosis Date  . Nonspecific elevation of levels of transaminase or lactic acid dehydrogenase (LDH) 1996    + Hepatitis srology  . GERD (gastroesophageal reflux disease)   . HTN (hypertension) 12/30/2007  . Diastolic dysfunction, left ventricle 05/30/2013    05/2013 see ECHO   . DJD (degenerative joint disease)   . Uterine cancer  (h/O)     no recent f/u   . ATONIC BLADDER-- has a permananet suprapubic catheter    . Prediabetes 04/07/2014  . Calculus in urethra     Dr. Diona Fanti    Past Surgical History  Procedure Laterality Date  . Esophageal dilation  2006  . Total knee arthroplasty  1996 & 2002  . Appendectomy    . Total abdominal hysterectomy w/ bilateral salpingoophorectomy  1990    uterine cancer  . Colonoscopy w/ polypectomy  1998    Tics in 2006; Dr Sharlett Iles  . Sbo  1991    adhesions resected  . Posterior laminectomy / decompression lumbar spine  2009    Dr Lorin Mercy  . Cataract surgery  2014    bilateral; Dr Prudencio Burly    Social History   Social History  . Marital Status: Widowed    Spouse Name: N/A  . Number of Children: 3  . Years of Education: N/A   Occupational History  . Retired-office work    Social History Main Topics  . Smoking status: Former Smoker    Quit date: 05/22/1986  . Smokeless tobacco: Not on file     Comment: smoked 1944-1988 , up to 1 ppd  . Alcohol Use: No  . Drug Use: No    . Sexual Activity: Not on file   Other Topics Concern  . Not on file   Social History Narrative   Lives by herself, drives           Medication List       This list is accurate as of: 09/01/15  7:25 PM.  Always use your most recent med list.               acetaminophen 325 MG tablet  Commonly known as:  TYLENOL  Take 650 mg by mouth every 6 (six) hours as needed (pain).     augmented betamethasone dipropionate 0.05 % cream  Commonly known as:  DIPROLENE-AF  Apply topically 2 (two) times daily.     benazepril 40 MG tablet  Commonly known as:  LOTENSIN  Take 1 tablet (40 mg total) by mouth daily.     CALCIUM 600 + D PO  Take 2 capsules by mouth daily. \     cephALEXin 500 MG capsule  Commonly known as:  KEFLEX  Take 1 capsule (500 mg total) by mouth 4 (four) times daily.     CITRUCEL PO  Take 2 tablets  by mouth daily as needed (constipation). Reported on 07/09/2015     Fish Oil 1200 MG Caps  Take 1 capsule by mouth daily.     furosemide 40 MG tablet  Commonly known as:  LASIX  Take 40 mg by mouth 2 (two) times daily.     INCRUSE ELLIPTA 62.5 MCG/INH Aepb  Generic drug:  umeclidinium bromide  Inhale 1 puff into the lungs daily.     KLOR-CON M20 20 MEQ tablet  Generic drug:  potassium chloride SA  TAKE 1 TABLET BY MOUTH DAILY     lactulose 10 GM/15ML solution  Commonly known as:  CHRONULAC  Take 15 mLs (10 g total) by mouth daily as needed for mild constipation.     multivitamin tablet  Take 1 tablet by mouth daily.     omeprazole 20 MG capsule  Commonly known as:  PRILOSEC  Take 20 mg by mouth daily. Take only on Sun, Tues,Thurs, Sat     sodium chloride irrigation 0.9 % irrigation  Irrigate with as directed once.     STOOL SOFTENER PO  Take 1 capsule by mouth daily.     traMADol 50 MG tablet  Commonly known as:  ULTRAM  Take 1 tablet (50 mg total) by mouth 3 (three) times daily as needed.     vitamin E 400 UNIT capsule  Take 400 Units by  mouth daily.     XARELTO 20 MG Tabs tablet  Generic drug:  rivaroxaban  TAKE 1 TABLET (20 MG TOTAL) BY MOUTH DAILY WITH SUPPER.           Objective:   Physical Exam BP 126/78 mmHg  Pulse 78  Temp(Src) 97.8 F (36.6 C) (Oral)  Ht 5' 2.5" (1.588 m)  Wt 225 lb 4 oz (102.173 kg)  BMI 40.52 kg/m2  SpO2 96%   General:   Well developed, well nourished . NAD.  HEENT:  Normocephalic . Face symmetric, atraumatic  Skin: Not pale. Not jaundice. See picture, pretibial rash is a slightly warm to touch, no blisters, skin is thick and a slightly red. Neurologic:  alert & oriented X3.  Speech normal, gait appropriate for age and unassisted Psych--  Cognition and judgment appear intact.  Cooperative with normal attention span and concentration.  Behavior appropriate. No anxious or depressed appearing.      Assessment & Plan:   Assessment>  Prediabetes w/ neuropathy ( + sx and decreased pinprick examination 03-2015) Mild renal failure, creatinine 1.4 baseline  HTN DJD -- tramadol prn  GI: --GERD  --H/o Esophageal stricture  L leg DVT 08-2014, saw hematology 12-2014, idiopathic, rec   xarelto x 1 year CHF (diastolic)---echo [id number] diastolic dysfx, LVH, nl EF Dr Angelena Form -- dry weight ~ 250 Emphysema. PFTs 05-2015 Atonic  bladder, permanent suprapubic catheter Renal stones h/oUterine cancer  TAH/BSO (1990s) H/p Elevated LFTs (1990s, h/o  + hep serology) Stasis dermatitis  H/o admitted 03-2015 with edema, felt not to be CHF   PLAN Stasis dermatitis: Findings likely d/t stasis dermatitis, early cellulitis?. Leg elevation, topical steroids, if not better to use Keflex, see instructions. CHF: On Lasix 40 mg twice a day, weight stable. Emphysema: Not using incruize,, doing well. RTC, May as already scheduled

## 2015-09-09 ENCOUNTER — Other Ambulatory Visit: Payer: PRIVATE HEALTH INSURANCE

## 2015-09-09 ENCOUNTER — Ambulatory Visit: Payer: PRIVATE HEALTH INSURANCE | Admitting: Family

## 2015-09-17 ENCOUNTER — Encounter: Payer: Self-pay | Admitting: Family

## 2015-09-17 ENCOUNTER — Other Ambulatory Visit (HOSPITAL_BASED_OUTPATIENT_CLINIC_OR_DEPARTMENT_OTHER): Payer: Medicare Other

## 2015-09-17 ENCOUNTER — Ambulatory Visit (HOSPITAL_BASED_OUTPATIENT_CLINIC_OR_DEPARTMENT_OTHER): Payer: Medicare Other | Admitting: Family

## 2015-09-17 VITALS — BP 105/36 | HR 89 | Temp 97.9°F | Resp 16 | Ht 62.0 in | Wt 218.0 lb

## 2015-09-17 DIAGNOSIS — I82412 Acute embolism and thrombosis of left femoral vein: Secondary | ICD-10-CM

## 2015-09-17 DIAGNOSIS — I82432 Acute embolism and thrombosis of left popliteal vein: Secondary | ICD-10-CM

## 2015-09-17 DIAGNOSIS — I82532 Chronic embolism and thrombosis of left popliteal vein: Secondary | ICD-10-CM

## 2015-09-17 DIAGNOSIS — I82402 Acute embolism and thrombosis of unspecified deep veins of left lower extremity: Secondary | ICD-10-CM

## 2015-09-17 DIAGNOSIS — C181 Malignant neoplasm of appendix: Secondary | ICD-10-CM | POA: Diagnosis not present

## 2015-09-17 LAB — COMPREHENSIVE METABOLIC PANEL (CC13)
A/G RATIO: 1 — AB (ref 1.2–2.2)
ALT: 11 IU/L (ref 0–32)
AST (SGOT): 20 IU/L (ref 0–40)
Albumin, Serum: 3.5 g/dL (ref 3.5–4.7)
Alkaline Phosphatase, S: 109 IU/L (ref 39–117)
BILIRUBIN TOTAL: 0.4 mg/dL (ref 0.0–1.2)
BUN/Creatinine Ratio: 16 (ref 12–28)
BUN: 26 mg/dL (ref 8–27)
CHLORIDE: 96 mmol/L (ref 96–106)
Calcium, Ser: 10.4 mg/dL — ABNORMAL HIGH (ref 8.7–10.3)
Carbon Dioxide, Total: 27 mmol/L (ref 18–29)
Creatinine, Ser: 1.61 mg/dL — ABNORMAL HIGH (ref 0.57–1.00)
GFR, EST AFRICAN AMERICAN: 32 mL/min/{1.73_m2} — AB (ref >59)
GFR, EST NON AFRICAN AMERICAN: 28 mL/min/{1.73_m2} — AB (ref >59)
GLOBULIN, TOTAL: 3.5 g/dL (ref 1.5–4.5)
Glucose: 98 mg/dL (ref 65–99)
POTASSIUM: 4.5 mmol/L (ref 3.5–5.2)
SODIUM: 129 mmol/L — AB (ref 134–144)
TOTAL PROTEIN: 7 g/dL (ref 6.0–8.5)

## 2015-09-17 LAB — CBC WITH DIFFERENTIAL (CANCER CENTER ONLY)
BASO#: 0.1 10*3/uL (ref 0.0–0.2)
BASO%: 0.6 % (ref 0.0–2.0)
EOS ABS: 0.5 10*3/uL (ref 0.0–0.5)
EOS%: 5.7 % (ref 0.0–7.0)
HEMATOCRIT: 39.2 % (ref 34.8–46.6)
HGB: 13.2 g/dL (ref 11.6–15.9)
LYMPH#: 2.7 10*3/uL (ref 0.9–3.3)
LYMPH%: 31.2 % (ref 14.0–48.0)
MCH: 30 pg (ref 26.0–34.0)
MCHC: 33.7 g/dL (ref 32.0–36.0)
MCV: 89 fL (ref 81–101)
MONO#: 1.2 10*3/uL — ABNORMAL HIGH (ref 0.1–0.9)
MONO%: 14.4 % — ABNORMAL HIGH (ref 0.0–13.0)
NEUT#: 4.1 10*3/uL (ref 1.5–6.5)
NEUT%: 48.1 % (ref 39.6–80.0)
PLATELETS: 269 10*3/uL (ref 145–400)
RBC: 4.4 10*6/uL (ref 3.70–5.32)
RDW: 14.5 % (ref 11.1–15.7)
WBC: 8.6 10*3/uL (ref 3.9–10.0)

## 2015-09-17 MED ORDER — RIVAROXABAN 20 MG PO TABS: ORAL_TABLET | ORAL | Status: DC

## 2015-09-17 NOTE — Progress Notes (Signed)
Hematology and Oncology Follow Up Visit  Christina Martin [id number] [birth year] 82 y.o. 09/17/2015   Principle Diagnosis:  DVT of the left leg  Current Therapy:   Xarelto 20 mg PO daily - decreased to 10 mg PO daily today (09/17/15)    Interim History:  Christina Martin is here today with her daughter for a follow-up. She is doing well and has no complaints at this time.  She still has some swelling in the left leg with +1 pitting edema. Pedal pulses are +2.  Her [place] in November showed a chronic DVT involving the left femoral vein and popliteal vein.  She has done well on Xarelto and had no episodes of bleeding or bruising.  No fever, chills, n/v, cough, rash, dizziness, chest pain, palpitations, abdominal pain or changes in bowel habits. Her suprapubic catheter is functioning appropriately.  The numbness and tingling in her hands and feet is unchanged. She has had no falls of syncopal episodes. She uses a walker while out and a cane to ambulate at home. She has had no recent falls.  Her appetite is good and she is staying well hydrated. Her weight is stable.   Medications:    Medication List       This list is accurate as of: 09/17/15  3:32 PM.  Always use your most recent med list.               acetaminophen 325 MG tablet  Commonly known as:  TYLENOL  Take 650 mg by mouth every 6 (six) hours as needed (pain).     augmented betamethasone dipropionate 0.05 % cream  Commonly known as:  DIPROLENE-AF  Apply topically 2 (two) times daily.     benazepril 40 MG tablet  Commonly known as:  LOTENSIN  Take 1 tablet (40 mg total) by mouth daily.     CALCIUM 600 + D PO  Take 2 capsules by mouth daily. \     cephALEXin 500 MG capsule  Commonly known as:  KEFLEX  Take 1 capsule (500 mg total) by mouth 4 (four) times daily.     CITRUCEL PO  Take 2 tablets by mouth daily as needed (constipation). Reported on 07/09/2015     Fish Oil 1200 MG Caps  Take 1 capsule by mouth daily.     furosemide 40 MG tablet  Commonly known as:  LASIX  Take 80 mg by mouth 2 (two) times daily.     INCRUSE ELLIPTA 62.5 MCG/INH Aepb  Generic drug:  umeclidinium bromide  Inhale 1 puff into the lungs daily.     KLOR-CON M20 20 MEQ tablet  Generic drug:  potassium chloride SA  TAKE 1 TABLET BY MOUTH DAILY     lactulose 10 GM/15ML solution  Commonly known as:  CHRONULAC  Take 15 mLs (10 g total) by mouth daily as needed for mild constipation.     multivitamin tablet  Take 1 tablet by mouth daily.     omeprazole 20 MG capsule  Commonly known as:  PRILOSEC  Take 20 mg by mouth daily. Take only on Sun, Tues,Thurs, Sat     sodium chloride irrigation 0.9 % irrigation  Irrigate with as directed once.     STOOL SOFTENER PO  Take 1 capsule by mouth daily.     traMADol 50 MG tablet  Commonly known as:  ULTRAM  Take 1 tablet (50 mg total) by mouth 3 (three) times daily as needed.     vitamin E 400  UNIT capsule  Take 400 Units by mouth daily.     XARELTO 20 MG Tabs tablet  Generic drug:  rivaroxaban  TAKE 1 TABLET (20 MG TOTAL) BY MOUTH DAILY WITH SUPPER.        Allergies:  Allergies  Allergen Reactions  . Lithostat [Acetohydroxamic Acid] Other (See Comments)    Unusual tiredness, weakness, nervousness, mental confusion, sore throat, Poor appetite    Past Medical History, Surgical history, Social history, and Family History were reviewed and updated.  Review of Systems: All other 10 point review of systems is negative.   Physical Exam:  height is 5\' 2"  (1.575 m) and weight is 218 lb (98.884 kg). Her oral temperature is 97.9 F (36.6 C). Her blood pressure is 105/36 and her pulse is 89. Her respiration is 16.   Wt Readings from Last 3 Encounters:  09/17/15 218 lb (98.884 kg)  09/01/15 225 lb 4 oz (102.173 kg)  08/31/15 228 lb (103.42 kg)    Ocular: Sclerae unicteric, pupils equal, round and reactive to light Ear-nose-throat: Oropharynx clear, dentition  fair Lymphatic: No cervical supraclavicular or axillary adenopathy Lungs no rales or rhonchi, good excursion bilaterally Heart regular rate and rhythm, no murmur appreciated Abd soft, nontender, positive bowel sounds, no liver or spleen tip palpated on exam, no fluid wave MSK no focal spinal tenderness, no joint edema Neuro: non-focal, well-oriented, appropriate affect Breasts: Deferred  Lab Results  Component Value Date   WBC 8.6 09/17/2015   HGB 13.2 09/17/2015   HCT 39.2 09/17/2015   MCV 89 09/17/2015   PLT 269 09/17/2015   No results found for: FERRITIN, IRON, TIBC, UIBC, IRONPCTSAT Lab Results  Component Value Date   RBC 4.40 09/17/2015   No results found for: KPAFRELGTCHN, LAMBDASER, KAPLAMBRATIO No results found for: IGGSERUM, IGA, IGMSERUM No results found for: Odetta Pink, SPEI   Chemistry      Component Value Date/Time   NA 137 07/13/2015 1508   NA 137 04/05/2015   K 4.4 07/13/2015 1508   CL 98 07/13/2015 1508   CO2 32 07/13/2015 1508   BUN 25* 07/13/2015 1508   BUN 31* 04/05/2015   CREATININE 1.67* 07/13/2015 1508   CREATININE 1.4* 04/05/2015   CREATININE 1.87* 10/02/2014 1514   GLU 102 04/05/2015      Component Value Date/Time   CALCIUM 9.8 07/13/2015 1508   ALKPHOS 126 04/07/2015 1447   AST 22 04/07/2015 1447   ALT 16 04/07/2015 1447   BILITOT 0.4 04/07/2015 1447     Impression and Plan: Christina Martin is a pleasant 82 yo white female with history of a thrombus in the left leg. Today she completed 1 full year of Xarelto. She still has some swelling or the left leg that comes and goes.  [place] in November showed a likely a chronic DVT involving the left femoral and popliteal veins.  We will now cut her back to 10 mg PO daily of Xarelto. She will take 1/2 pill daily. She verbalized understanding of this.  We will plan to see her back in 6 months for labs and follow-up.  Both she and her daughter know to  contact us with any questions or concerns. We can certainly see her sooner if need be.   Eliezer Bottom, NP 4/28/20173:32 PM

## 2015-09-25 ENCOUNTER — Other Ambulatory Visit: Payer: Self-pay | Admitting: Hematology & Oncology

## 2015-09-30 ENCOUNTER — Other Ambulatory Visit: Payer: Self-pay

## 2015-09-30 NOTE — Patient Outreach (Signed)
[facility]) Care Management  09/30/2015  Christina Martin 22-Sep-1924 [id number]   SUBJECTIVE; Telephone call to patient regarding heart failure follow up.  HIPAA confirmed with patient. Patient states she is doing very well.  Patient  States she saw her doctor in April for follow up and because she had itching and pain in her legs.  Patient states her doctor gave her a cream that she has used.  Patient reports improvements to her legs since using the cream.  Patient denies any itching or pain.  Patient states she still has redness in her legs. Patient states, " I will always have the redness in my legs."  Patient reports her symptoms have gotten much better. Patient denies any abnormal swelling in her extremities.  Patient denies any shortness of breath, chest pain or cough.  Patient states her weight today is 218 lbs.  Patient states she is in the Christina Martin zone of her heart failure action plan.  Patient states she has the heart failure action plan and nurse advise line call number magnet on her refrigerator.   Patient states symptoms for yellow zone of heart failure action plan are:  Weight gain of 3 lbs over night, shortness of breath, swelling.  Patient verbalizes agreement with closure to [name] care management services at this time. Patient verbalized appreciation to [name] for services and follow up.    ASSESSMENT:  Patient is self managing her care. Patient continues to keep follow up appointments with her doctor and is compliant and knowledgeable of her medications. Patient is aware of heart failure symptoms and when to contact her doctor.  Patient continues to monitor and record her weights daily.  Patient is aware of the heart failure action plan and Zones.    PLAN:  RNCM will refer patient to Christina Martin to close due to goals being met. RNCM will notify patients primary MD of closure.   Christina Plowman RN,BSN,CCM [facility] Telephonic  [phone]

## 2015-10-08 DIAGNOSIS — N312 Flaccid neuropathic bladder, not elsewhere classified: Secondary | ICD-10-CM | POA: Diagnosis not present

## 2015-10-08 DIAGNOSIS — N210 Calculus in bladder: Secondary | ICD-10-CM | POA: Diagnosis not present

## 2015-10-08 DIAGNOSIS — M62838 Other muscle spasm: Secondary | ICD-10-CM | POA: Diagnosis not present

## 2015-10-11 ENCOUNTER — Other Ambulatory Visit: Payer: Self-pay

## 2015-10-12 ENCOUNTER — Ambulatory Visit (INDEPENDENT_AMBULATORY_CARE_PROVIDER_SITE_OTHER): Payer: Medicare Other | Admitting: Internal Medicine

## 2015-10-12 ENCOUNTER — Encounter: Payer: Self-pay | Admitting: Internal Medicine

## 2015-10-12 VITALS — BP 116/84 | HR 88 | Temp 98.1°F | Ht 62.0 in | Wt 218.5 lb

## 2015-10-12 DIAGNOSIS — I10 Essential (primary) hypertension: Secondary | ICD-10-CM | POA: Diagnosis not present

## 2015-10-12 DIAGNOSIS — I8312 Varicose veins of left lower extremity with inflammation: Secondary | ICD-10-CM

## 2015-10-12 DIAGNOSIS — Z780 Asymptomatic menopausal state: Secondary | ICD-10-CM | POA: Diagnosis not present

## 2015-10-12 DIAGNOSIS — I825Y2 Chronic embolism and thrombosis of unspecified deep veins of left proximal lower extremity: Secondary | ICD-10-CM | POA: Diagnosis not present

## 2015-10-12 DIAGNOSIS — I8311 Varicose veins of right lower extremity with inflammation: Secondary | ICD-10-CM | POA: Diagnosis not present

## 2015-10-12 DIAGNOSIS — Z0000 Encounter for general adult medical examination without abnormal findings: Secondary | ICD-10-CM

## 2015-10-12 DIAGNOSIS — I872 Venous insufficiency (chronic) (peripheral): Secondary | ICD-10-CM

## 2015-10-12 DIAGNOSIS — Z09 Encounter for follow-up examination after completed treatment for conditions other than malignant neoplasm: Secondary | ICD-10-CM

## 2015-10-12 DIAGNOSIS — R739 Hyperglycemia, unspecified: Secondary | ICD-10-CM | POA: Diagnosis not present

## 2015-10-12 NOTE — Assessment & Plan Note (Signed)
Td --07-21-14; pnm shot 2012; Prevnar: 07-21-14 Zostavax : h/o shingles, recommend no Zostavax given age, patient in agreement  Colon and breast cancer  screening no longer indicated History of uterine cancer, hysterectomy in the 90s, has not seen gynecology in many years : Patient has declined further eval

## 2015-10-12 NOTE — Patient Instructions (Signed)
Get your blood work before you leave   next visit in 6 months    Fall Prevention and [place] cause injuries and can affect all age groups. It is possible to use preventive measures to significantly decrease the likelihood of falls. There are many simple measures which can make your home safer and prevent falls. OUTDOORS  Repair cracks and edges of walkways and driveways.  Remove high doorway thresholds.  Trim shrubbery on the main path into your home.  Have good outside lighting.  Clear walkways of tools, rocks, debris, and clutter.  Check that handrails are not broken and are securely fastened. Both sides of steps should have handrails.  Have leaves, snow, and ice cleared regularly.  Use sand or salt on walkways during winter months.  In the garage, clean up grease or oil spills. BATHROOM  Install night lights.  Install grab bars by the toilet and in the tub and shower.  Use non-skid mats or decals in the tub or shower.  Place a plastic non-slip stool in the shower to sit on, if needed.  Keep floors dry and clean up all water on the floor immediately.  Remove soap buildup in the tub or shower on a regular basis.  Secure bath mats with non-slip, double-sided rug tape.  Remove throw rugs and tripping hazards from the floors. BEDROOMS  Install night lights.  Make sure a bedside light is easy to reach.  Do not use oversized bedding.  Keep a telephone by your bedside.  Have a firm chair with side arms to use for getting dressed.  Remove throw rugs and tripping hazards from the floor. KITCHEN  Keep handles on pots and pans turned toward the center of the stove. Use back burners when possible.  Clean up spills quickly and allow time for drying.  Avoid walking on wet floors.  Avoid hot utensils and knives.  Position shelves so they are not too high or low.  Place commonly used objects within easy reach.  If necessary, use a sturdy step stool  with a grab bar when reaching.  Keep electrical cables out of the way.  Do not use floor polish or wax that makes floors slippery. If you must use wax, use non-skid floor wax.  Remove throw rugs and tripping hazards from the floor. STAIRWAYS  Never leave objects on stairs.  Place handrails on both sides of stairways and use them. Fix any loose handrails. Make sure handrails on both sides of the stairways are as long as the stairs.  Check carpeting to make sure it is firmly attached along stairs. Make repairs to worn or loose carpet promptly.  Avoid placing throw rugs at the top or bottom of stairways, or properly secure the rug with carpet tape to prevent slippage. Get rid of throw rugs, if possible.  Have an electrician put in a light switch at the top and bottom of the stairs. OTHER FALL PREVENTION TIPS  Wear low-heel or rubber-soled shoes that are supportive and fit well. Wear closed toe shoes.  When using a stepladder, make sure it is fully opened and both spreaders are firmly locked. Do not climb a closed stepladder.  Add color or contrast paint or tape to grab bars and handrails in your home. Place contrasting color strips on first and last steps.  Learn and use mobility aids as needed. Install an electrical emergency response system.  Turn on lights to avoid dark areas. Replace light bulbs that burn out immediately. Get  Get light switches that glow.  Arrange furniture to create clear pathways. Keep furniture in the same place.  Firmly attach carpet with non-skid or double-sided tape.  Eliminate uneven floor surfaces.  Select a carpet pattern that does not visually hide the edge of steps.  Be aware of all pets. OTHER HOME SAFETY TIPS  Set the water temperature for 120 F (48.8 C).  Keep emergency numbers on or near the telephone.  Keep smoke detectors on every level of the home and near sleeping areas. Document Released: 04/28/2002 Document Revised: 11/07/2011  Document Reviewed: 07/28/2011 ExitCare Patient Information 2015 [employer]. This information is not intended to replace advice given to you by your health care provider. Make sure you discuss any questions you have with your health care provider.   Preventive Care for Adults Ages 65 and over  Blood pressure check.** / Every 1 to 2 years.  Lipid and cholesterol check.**/ Every 5 years beginning at age 20.  Lung cancer screening. / Every year if you are aged 55-80 years and have a 30-pack-year history of smoking and currently smoke or have quit within the past 15 years. Yearly screening is stopped once you have quit smoking for at least 15 years or develop a health problem that would prevent you from having lung cancer treatment.  Fecal occult blood test (FOBT) of stool. / Every year beginning at age 50 and continuing until age 75. You may not have to do this test if you get a colonoscopy every 10 years.  Flexible sigmoidoscopy** or colonoscopy.** / Every 5 years for a flexible sigmoidoscopy or every 10 years for a colonoscopy beginning at age 50 and continuing until age 75.  Hepatitis C blood test.** / For all people born from 1945 through 1965 and any individual with known risks for hepatitis C.  Abdominal aortic aneurysm (AAA) screening.** / A one-time screening for ages 65 to 75 years who are current or former smokers.  Skin self-exam. / Monthly.  Influenza vaccine. / Every year.  Tetanus, diphtheria, and acellular pertussis (Tdap/Td) vaccine.** / 1 dose of Td every 10 years.  Varicella vaccine.** / Consult your health care provider.  Zoster vaccine.** / 1 dose for adults aged 60 years or older.  Pneumococcal 13-valent conjugate (PCV13) vaccine.** / Consult your health care provider.  Pneumococcal polysaccharide (PPSV23) vaccine.** / 1 dose for all adults aged 65 years and older.  Meningococcal vaccine.** / Consult your health care provider.  Hepatitis A vaccine.** /  Consult your health care provider.  Hepatitis B vaccine.** / Consult your health care provider.  Haemophilus influenzae type b (Hib) vaccine.** / Consult your health care provider. **Family history and personal history of risk and conditions may change your health care provider's recommendations. Document Released: 07/04/2001 Document Revised: 05/13/2013 Document Reviewed: 10/03/2010 ExitCare Patient Information 2015 [employer]. This information is not intended to replace advice given to you by your health care provider. Make sure you discuss any questions you have with your health care provider.   

## 2015-10-12 NOTE — Assessment & Plan Note (Signed)
Diabetes: Check a FLP Renal failure, HTN: Continue Lotensin, Lasix. Last sodium low, check a BMP. DJD well controlled with tramadol as needed Osteopenia: Remote history of osteopenia (osteoporosis?) At some point took Fosamax. On calcium and vitamin D. The pros and cons of a bone density test discuss, elected to proceed with a bone density test. Left leg DVT: Saw hematology 08/2015, Xarelto those decrease, follow-up October 2017. Stasis  dermatitis: Better with the diprolene and Lasix RTC 6 months

## 2015-10-12 NOTE — Progress Notes (Signed)
Pre visit review using our clinic review tool, if applicable. No additional management support is needed unless otherwise documented below in the visit note. 

## 2015-10-12 NOTE — Progress Notes (Signed)
Subjective:    Patient ID: Christina Martin, female    DOB: 03-15-1925, 82 y.o.   MRN: [id number]  DOS:  10/12/2015 Type of visit - description :   Here for Medicare AWV:  1. Risk factors based on Past M, S, F history: reviewed 2. Physical Activities: Mobile within her house w/ a walker  3. Depression/mood: neg screening  4. Hearing: Has hearing aids  5. ADL's: independent, stopped driving  6. Fall Risk: no recent  fall,  prevention discussed  7. home Safety: does feel safe at home  8. Height, weight, & visual acuity: see VS, sees eye doctor regulalrly 9. Counseling: provided 10. Labs ordered based on risk factors: if needed  11. Referral Coordination: if needed 12. Care Plan, see assessment and plan , written personalized plan provided  13. Cognitive Assessment: motor skills and cognition appropriate for age 60. Care team updated 15. End-of-life care discussed, has  A HC-POA  In addition, today we discussed the following: stasis dermatitis, edema: Improved compared to the last time she was here DVT: Saw hematology, note reviewed, good compliance with decreased dose of Xarelto HTN: No ambulatory BPs History of osteopenia: Currently doing calcium and vitamin D, no recent bone density test  Review of Systems In general doing well, no new or prominent symptoms. Constitutional: No fever. No chills. No unexplained wt changes. No unusual sweats  HEENT: No dental problems, no ear discharge, no facial swelling, no voice changes. No eye discharge, no eye  redness , no  intolerance to light   Respiratory: No wheezing , no  difficulty breathing. No cough , no mucus production  Cardiovascular: No CP, no leg swelling , no  Palpitations  GI: no nausea, no vomiting, no diarrhea , no  abdominal pain.  No blood in the stools. No dysphagia, no odynophagia    Endocrine: No polyphagia, no polyuria , no polydipsia  GU: No dysuria, gross hematuria, difficulty urinating. No urinary  urgency, no frequency.  Musculoskeletal: No joint swellings or unusual aches or pains  Skin: No change in the color of the skin, palor , no  Rash  Allergic, immunologic: No environmental allergies , no  food allergies  Neurological: No dizziness no  syncope. No headaches. No diplopia, no slurred, no slurred speech, no motor deficits, no facial  Numbness  Hematological: No enlarged lymph nodes, no easy bruising , no unusual bleedings  Psychiatry: No suicidal ideas, no hallucinations, no beavior problems, no confusion.  No unusual/severe anxiety, no depression   Past Medical History  Diagnosis Date  . Nonspecific elevation of levels of transaminase or lactic acid dehydrogenase (LDH) 1996    + Hepatitis srology  . GERD (gastroesophageal reflux disease)   . HTN (hypertension) 12/30/2007  . Diastolic dysfunction, left ventricle 05/30/2013    05/2013 see ECHO   . DJD (degenerative joint disease)   . Uterine cancer  (h/O)     no recent f/u   . ATONIC BLADDER-- has a permananet suprapubic catheter    . Prediabetes 04/07/2014  . Calculus in urethra     Dr. Diona Fanti    Past Surgical History  Procedure Laterality Date  . Esophageal dilation  2006  . Total knee arthroplasty  1996 & 2002  . Appendectomy    . Total abdominal hysterectomy w/ bilateral salpingoophorectomy  1990    uterine cancer  . Colonoscopy w/ polypectomy  1998    Tics in 2006; Dr Sharlett Iles  . Sbo  1991    adhesions  resected  . Posterior laminectomy / decompression lumbar spine  2009    Dr Lorin Mercy  . Cataract surgery  2014    bilateral; Dr Prudencio Burly    Social History   Social History  . Marital Status: Widowed    Spouse Name: N/A  . Number of Children: 3  . Years of Education: N/A   Occupational History  . Retired-office work    Social History Main Topics  . Smoking status: Former Smoker    Quit date: 05/22/1986  . Smokeless tobacco: Not on file     Comment: smoked 1944-1988 , up to 1 ppd  . Alcohol Use:  No  . Drug Use: No  . Sexual Activity: Not on file   Other Topics Concern  . Not on file   Social History Narrative   Lives by herself, doesn't drive, family in the area        Family History  Problem Relation Age of Onset  . Liver disease Sister     non hepatitis  . Colon cancer Mother   . Diabetes Mother     mother, brother  . Stroke Mother     > 72  . Heart failure Sister   . CAD Brother     brother, mother , sister  . Breast cancer Neg Hx        Medication List       This list is accurate as of: 10/12/15  5:57 PM.  Always use your most recent med list.               acetaminophen 325 MG tablet  Commonly known as:  TYLENOL  Take 650 mg by mouth every 6 (six) hours as needed (pain).     augmented betamethasone dipropionate 0.05 % cream  Commonly known as:  DIPROLENE-AF  Apply topically 2 (two) times daily.     benazepril 40 MG tablet  Commonly known as:  LOTENSIN  Take 1 tablet (40 mg total) by mouth daily.     CALCIUM 600 + D PO  Take 2 capsules by mouth daily. \     CITRUCEL PO  Take 2 tablets by mouth daily as needed (constipation). Reported on 07/09/2015     Fish Oil 1200 MG Caps  Take 1 capsule by mouth daily.     furosemide 80 MG tablet  Commonly known as:  LASIX  Take 80 mg by mouth 2 (two) times daily.     INCRUSE ELLIPTA 62.5 MCG/INH Aepb  Generic drug:  umeclidinium bromide  Inhale 1 puff into the lungs daily.     KLOR-CON M20 20 MEQ tablet  Generic drug:  potassium chloride SA  TAKE 1 TABLET BY MOUTH DAILY     lactulose 10 GM/15ML solution  Commonly known as:  CHRONULAC  Take 15 mLs (10 g total) by mouth daily as needed for mild constipation.     multivitamin tablet  Take 1 tablet by mouth daily.     omeprazole 20 MG capsule  Commonly known as:  PRILOSEC  Take 20 mg by mouth daily. Take only on Sun, Tues,Thurs, Sat     rivaroxaban 20 MG Tabs tablet  Commonly known as:  XARELTO  Take 10 mg by mouth daily with supper.      sodium chloride irrigation 0.9 % irrigation  Irrigate with as directed once.     STOOL SOFTENER PO  Take 1 capsule by mouth daily.     traMADol 50 MG tablet  Commonly known  as:  ULTRAM  Take 1 tablet (50 mg total) by mouth 3 (three) times daily as needed.     vitamin E 400 UNIT capsule  Take 400 Units by mouth daily.           Objective:   Physical Exam BP 116/84 mmHg  Pulse 88  Temp(Src) 98.1 F (36.7 C) (Oral)  Ht 5\' 2"  (1.575 m)  Wt 218 lb 8 oz (99.111 kg)  BMI 39.95 kg/m2  SpO2 95% General:   Well developed, well nourished . NAD.  HEENT:  Normocephalic . Face symmetric, atraumatic Lungs:  CTA B Normal respiratory effort, no intercostal retractions, no accessory muscle use. Heart: RRR,  no murmur.  no pretibial edema bilaterally  Abdomen:  Not distended, soft, non-tender. No rebound or rigidity.  Lower extremities:   skin: Less red than before. Edema less noticeable skin slightly worse on the left Neurologic:  alert & oriented X3.  Speech normal, gait  assisted by a walker Psych--  Cognition and judgment appear intact.  Cooperative with normal attention span and concentration.  Behavior appropriate. No anxious or depressed appearing.    Assessment & Plan:   Assessment>  Prediabetes w/ neuropathy ( + sx and decreased pinprick examination 03-2015) Mild renal failure, creatinine 1.4 baseline  HTN DJD -- tramadol prn  COPD: See PFTs 05-2015 GI: --GERD  --H/o Esophageal stricture  --constipation L leg DVT 08-2014, saw hematology 12-2014, idiopathic,  xarelto decreased to 1/2 dose [id number] CHF (diastolic)---echo [id number] diastolic dysfx, LVH, nl EF Dr Angelena Form -- dry weight ~ 250  Edema, chronic LE  Stasis dermatitis -- diprolene, lasix GU: ---Atonic  bladder, permanent suprapubic catheter ---Renal stones h/oUterine cancer  TAH/BSO (1990s) H/p Elevated LFTs (1990s, h/o  + hep serology) H/o admitted 03-2015 with edema, felt not to be CHF   PLAN Diabetes:  Check a FLP Renal failure, HTN: Continue Lotensin, Lasix. Last sodium low, check a BMP. DJD well controlled with tramadol as needed Osteopenia: Remote history of osteopenia (osteoporosis?) At some point took Fosamax. On calcium and vitamin D. The pros and cons of a bone density test discuss, elected to proceed with a bone density test. Left leg DVT: Saw hematology 08/2015, Xarelto those decrease, follow-up October 2017. Stasis  dermatitis: Better with the diprolene and Lasix RTC 6 months

## 2015-10-13 LAB — BASIC METABOLIC PANEL
BUN: 29 mg/dL — ABNORMAL HIGH (ref 6–23)
CHLORIDE: 99 meq/L (ref 96–112)
CO2: 33 meq/L — AB (ref 19–32)
Calcium: 10 mg/dL (ref 8.4–10.5)
Creatinine, Ser: 1.51 mg/dL — ABNORMAL HIGH (ref 0.40–1.20)
GFR: 34.48 mL/min — ABNORMAL LOW (ref >60.00)
GLUCOSE: 92 mg/dL (ref 70–99)
Potassium: 4.2 mEq/L (ref 3.5–5.1)
SODIUM: 138 meq/L (ref 135–145)

## 2015-10-13 LAB — LIPID PANEL
CHOL/HDL RATIO: 3
Cholesterol: 190 mg/dL (ref 0–200)
HDL: 57 mg/dL (ref >39.00)
LDL CALC: 119 mg/dL — AB (ref 0–99)
NonHDL: 133.09
TRIGLYCERIDES: 68 mg/dL (ref 0.0–149.0)
VLDL: 13.6 mg/dL (ref 0.0–40.0)

## 2015-10-21 ENCOUNTER — Telehealth: Payer: Self-pay | Admitting: Internal Medicine

## 2015-10-21 ENCOUNTER — Ambulatory Visit (HOSPITAL_BASED_OUTPATIENT_CLINIC_OR_DEPARTMENT_OTHER): Admission: RE | Admit: 2015-10-21 | Payer: Medicare Other | Source: Ambulatory Visit

## 2015-10-21 NOTE — Telephone Encounter (Signed)
Imaging called up to inform provider that they were unable to do the Bone Density Test on this patient because the patient could not lay flat or straight.

## 2015-10-21 NOTE — Telephone Encounter (Signed)
Noted, we will discuss with the patient on RTC

## 2015-11-02 DIAGNOSIS — N312 Flaccid neuropathic bladder, not elsewhere classified: Secondary | ICD-10-CM | POA: Diagnosis not present

## 2015-11-02 DIAGNOSIS — N210 Calculus in bladder: Secondary | ICD-10-CM | POA: Diagnosis not present

## 2015-11-22 ENCOUNTER — Other Ambulatory Visit: Payer: Self-pay | Admitting: Internal Medicine

## 2015-11-22 NOTE — Telephone Encounter (Signed)
Rx faxed to pharmacy/[facility] 07/03 

## 2015-12-07 DIAGNOSIS — N312 Flaccid neuropathic bladder, not elsewhere classified: Secondary | ICD-10-CM | POA: Diagnosis not present

## 2015-12-16 DIAGNOSIS — Z0100 Encounter for examination of eyes and vision without abnormal findings: Secondary | ICD-10-CM | POA: Diagnosis not present

## 2015-12-16 DIAGNOSIS — H26491 Other secondary cataract, right eye: Secondary | ICD-10-CM | POA: Diagnosis not present

## 2015-12-16 DIAGNOSIS — Z961 Presence of intraocular lens: Secondary | ICD-10-CM | POA: Diagnosis not present

## 2016-01-04 DIAGNOSIS — N312 Flaccid neuropathic bladder, not elsewhere classified: Secondary | ICD-10-CM | POA: Diagnosis not present

## 2016-02-01 DIAGNOSIS — N312 Flaccid neuropathic bladder, not elsewhere classified: Secondary | ICD-10-CM | POA: Diagnosis not present

## 2016-02-08 DIAGNOSIS — H26491 Other secondary cataract, right eye: Secondary | ICD-10-CM | POA: Diagnosis not present

## 2016-02-10 ENCOUNTER — Other Ambulatory Visit: Payer: Self-pay | Admitting: Family Medicine

## 2016-02-10 NOTE — Telephone Encounter (Signed)
Rx faxed to [facility] pharmacy.  

## 2016-02-10 NOTE — Telephone Encounter (Signed)
Rx printed, awaiting MD signature.  

## 2016-02-10 NOTE — Telephone Encounter (Signed)
Pt is requesting refill on Tramadol.  Last OV: 10/12/2015 Last Fill: 11/22/2015 #90 and 1RF (Pt sig:1 tablet TID PRN) UDS: 07/22/2014 Low risk  Please advise.

## 2016-02-10 NOTE — Telephone Encounter (Signed)
Okay #90 and 1 refill 

## 2016-02-12 ENCOUNTER — Other Ambulatory Visit: Payer: Self-pay | Admitting: Cardiovascular Disease

## 2016-02-28 DIAGNOSIS — N312 Flaccid neuropathic bladder, not elsewhere classified: Secondary | ICD-10-CM | POA: Diagnosis not present

## 2016-02-28 DIAGNOSIS — N210 Calculus in bladder: Secondary | ICD-10-CM | POA: Diagnosis not present

## 2016-02-28 HISTORY — PX: CYSTOSCOPY: SUR368

## 2016-03-13 ENCOUNTER — Other Ambulatory Visit (HOSPITAL_BASED_OUTPATIENT_CLINIC_OR_DEPARTMENT_OTHER): Payer: Medicare Other

## 2016-03-13 ENCOUNTER — Other Ambulatory Visit: Payer: Self-pay | Admitting: Internal Medicine

## 2016-03-13 ENCOUNTER — Encounter: Payer: Self-pay | Admitting: Family

## 2016-03-13 ENCOUNTER — Ambulatory Visit (HOSPITAL_BASED_OUTPATIENT_CLINIC_OR_DEPARTMENT_OTHER): Payer: Medicare Other | Admitting: Family

## 2016-03-13 ENCOUNTER — Ambulatory Visit (HOSPITAL_BASED_OUTPATIENT_CLINIC_OR_DEPARTMENT_OTHER): Payer: Medicare Other

## 2016-03-13 VITALS — BP 136/65 | HR 97 | Temp 97.9°F | Resp 16 | Ht 62.0 in | Wt 235.0 lb

## 2016-03-13 DIAGNOSIS — I82512 Chronic embolism and thrombosis of left femoral vein: Secondary | ICD-10-CM

## 2016-03-13 DIAGNOSIS — R609 Edema, unspecified: Secondary | ICD-10-CM | POA: Diagnosis not present

## 2016-03-13 DIAGNOSIS — I509 Heart failure, unspecified: Secondary | ICD-10-CM

## 2016-03-13 DIAGNOSIS — Z23 Encounter for immunization: Secondary | ICD-10-CM | POA: Diagnosis not present

## 2016-03-13 DIAGNOSIS — I82432 Acute embolism and thrombosis of left popliteal vein: Secondary | ICD-10-CM | POA: Diagnosis not present

## 2016-03-13 DIAGNOSIS — I82412 Acute embolism and thrombosis of left femoral vein: Secondary | ICD-10-CM | POA: Diagnosis not present

## 2016-03-13 DIAGNOSIS — I82532 Chronic embolism and thrombosis of left popliteal vein: Secondary | ICD-10-CM

## 2016-03-13 LAB — CBC WITH DIFFERENTIAL (CANCER CENTER ONLY)
BASO#: 0.1 10*3/uL (ref 0.0–0.2)
BASO%: 0.5 % (ref 0.0–2.0)
EOS%: 2.8 % (ref 0.0–7.0)
Eosinophils Absolute: 0.3 10*3/uL (ref 0.0–0.5)
HEMATOCRIT: 37.6 % (ref 34.8–46.6)
HGB: 12.6 g/dL (ref 11.6–15.9)
LYMPH#: 2 10*3/uL (ref 0.9–3.3)
LYMPH%: 21 % (ref 14.0–48.0)
MCH: 30 pg (ref 26.0–34.0)
MCHC: 33.5 g/dL (ref 32.0–36.0)
MCV: 90 fL (ref 81–101)
MONO#: 1.2 10*3/uL — ABNORMAL HIGH (ref 0.1–0.9)
MONO%: 13 % (ref 0.0–13.0)
NEUT#: 6 10*3/uL (ref 1.5–6.5)
NEUT%: 62.7 % (ref 39.6–80.0)
PLATELETS: 269 10*3/uL (ref 145–400)
RBC: 4.2 10*6/uL (ref 3.70–5.32)
RDW: 13.9 % (ref 11.1–15.7)
WBC: 9.6 10*3/uL (ref 3.9–10.0)

## 2016-03-13 LAB — COMPREHENSIVE METABOLIC PANEL
ALT: 23 U/L (ref 0–55)
AST: 32 U/L (ref 5–34)
Albumin: 3 g/dL — ABNORMAL LOW (ref 3.5–5.0)
Alkaline Phosphatase: 149 U/L (ref 40–150)
Anion Gap: 9 mEq/L (ref 3–11)
BUN: 25.8 mg/dL (ref 7.0–26.0)
CALCIUM: 10.1 mg/dL (ref 8.4–10.4)
CHLORIDE: 96 meq/L — AB (ref 98–109)
CO2: 31 meq/L — AB (ref 22–29)
CREATININE: 1.6 mg/dL — AB (ref 0.6–1.1)
EGFR: 29 mL/min/{1.73_m2} — ABNORMAL LOW (ref >90)
Glucose: 121 mg/dl (ref 70–140)
Potassium: 5 mEq/L (ref 3.5–5.1)
Sodium: 137 mEq/L (ref 136–145)
Total Bilirubin: 0.33 mg/dL (ref 0.20–1.20)
Total Protein: 7.4 g/dL (ref 6.4–8.3)

## 2016-03-13 MED ORDER — INFLUENZA VAC SPLIT QUAD 0.5 ML IM SUSY
0.5000 mL | PREFILLED_SYRINGE | Freq: Once | INTRAMUSCULAR | Status: AC
  Administered 2016-03-13: 0.5 mL via INTRAMUSCULAR
  Filled 2016-03-13: qty 0.5

## 2016-03-13 NOTE — Patient Instructions (Signed)

## 2016-03-13 NOTE — Progress Notes (Signed)
Hematology and Oncology Follow Up Visit  Christina Martin [id number] 01/25/1925 82 y.o. 03/13/2016   Principle Diagnosis:  DVT of the left leg  Current Therapy:   Xarelto 10 mg PO daily    Interim History:  Christina Martin is here today with her daughter for a follow-up. She has some swelling in both lower extremities her daughter states is due to CHF. She is on Lasix 80 mg PO BID. She has some redness along the front of her legs with the swelling. No tenderness or heat. +2 pitting edema. Pedal pulses +1 bilaterally. She feels this improves when she elevates her legs in her recliner.  She has some mild SOB with exertion. Lung sound are clear bilaterally.  She has done well on Xarelto and had no episodes of bleeding or bruising.  She is unable to wear compression stockings and will occasionally wrap her legs with an ACE bandage instead.  No fever, chills, n/v, cough, rash, dizziness, chest pain, palpitations, abdominal pain or changes in bowel habits. Her suprapubic catheter is functioning appropriately.  She naps a lot during the day and as a result has trouble sleeping at night.  The numbness and tingling in her hands and feet is unchanged. She has had no falls of syncopal episodes. She uses a walker while out and a cane to ambulate at home. She has maintained a good appetite and is staying well hydrated. Her weight is stable.   Medications:    Medication List       Accurate as of 03/13/16  1:36 PM. Always use your most recent med list.          acetaminophen 325 MG tablet Commonly known as:  TYLENOL Take 650 mg by mouth every 6 (six) hours as needed (pain).   augmented betamethasone dipropionate 0.05 % cream Commonly known as:  DIPROLENE-AF Apply topically 2 (two) times daily.   benazepril 40 MG tablet Commonly known as:  LOTENSIN Take 1 tablet (40 mg total) by mouth daily.   CALCIUM 600 + D PO Take 2 capsules by mouth daily. \   CITRUCEL PO Take 2 tablets by mouth daily as  needed (constipation). Reported on 07/09/2015   Fish Oil 1200 MG Caps Take 1 capsule by mouth daily.   furosemide 80 MG tablet Commonly known as:  LASIX TAKE 1 TABLET (80 MG TOTAL) BY MOUTH 2 (TWO) TIMES DAILY.   INCRUSE ELLIPTA 62.5 MCG/INH Aepb Generic drug:  umeclidinium bromide Inhale 1 puff into the lungs daily.   KLOR-CON M20 20 MEQ tablet Generic drug:  potassium chloride SA TAKE 1 TABLET BY MOUTH DAILY   lactulose 10 GM/15ML solution Commonly known as:  CHRONULAC Take 15 mLs (10 g total) by mouth daily as needed for mild constipation.   multivitamin tablet Take 1 tablet by mouth daily.   omeprazole 20 MG capsule Commonly known as:  PRILOSEC Take 20 mg by mouth daily. Take only on Sun, Tues,Thurs, Sat   rivaroxaban 20 MG Tabs tablet Commonly known as:  XARELTO Take 10 mg by mouth daily with supper.   sodium chloride irrigation 0.9 % irrigation Irrigate with as directed once.   STOOL SOFTENER PO Take 1 capsule by mouth daily.   traMADol 50 MG tablet Commonly known as:  ULTRAM Take 1 tablet (50 mg total) by mouth 3 (three) times daily as needed for moderate pain.   vitamin E 400 UNIT capsule Take 400 Units by mouth daily.       Allergies:  Allergies  Allergen Reactions  . Lithostat [Acetohydroxamic Acid] Other (See Comments)    Unusual tiredness, weakness, nervousness, mental confusion, sore throat, Poor appetite    Past Medical History, Surgical history, Social history, and Family History were reviewed and updated.  Review of Systems: All other 10 point review of systems is negative.   Physical Exam:  vitals were not taken for this visit.  Wt Readings from Last 3 Encounters:  10/12/15 218 lb 8 oz (99.1 kg)  09/17/15 218 lb (98.9 kg)  09/01/15 225 lb 4 oz (102.2 kg)    Ocular: Sclerae unicteric, pupils equal, round and reactive to light Ear-nose-throat: Oropharynx clear, dentition fair Lymphatic: No cervical supraclavicular or axillary  adenopathy Lungs no rales or rhonchi, good excursion bilaterally Heart regular rate and rhythm, no murmur or rub appreciated Abd soft, nontender, positive bowel sounds, no liver or spleen tip palpated on exam, no fluid wave MSK no focal spinal tenderness, no joint edema Neuro: non-focal, well-oriented, appropriate affect Breasts: Deferred  Lab Results  Component Value Date   WBC 9.6 03/13/2016   HGB 12.6 03/13/2016   HCT 37.6 03/13/2016   MCV 90 03/13/2016   PLT 269 03/13/2016   No results found for: FERRITIN, IRON, TIBC, UIBC, IRONPCTSAT Lab Results  Component Value Date   RBC 4.20 03/13/2016   No results found for: KPAFRELGTCHN, LAMBDASER, KAPLAMBRATIO No results found for: IGGSERUM, IGA, IGMSERUM No results found for: Odetta Pink, SPEI   Chemistry      Component Value Date/Time   NA 138 10/12/2015 1548   NA 129 (L) 09/17/2015 1453   K 4.2 10/12/2015 1548   K 4.5 09/17/2015 1453   CL 99 10/12/2015 1548   CL 96 09/17/2015 1453   CO2 33 (H) 10/12/2015 1548   CO2 27 09/17/2015 1453   BUN 29 (H) 10/12/2015 1548   BUN 26 09/17/2015 1453   CREATININE 1.51 (H) 10/12/2015 1548   CREATININE 1.61 (H) 09/17/2015 1453   CREATININE 1.87 (H) 10/02/2014 1514   GLU 102 04/05/2015      Component Value Date/Time   CALCIUM 10.0 10/12/2015 1548   CALCIUM 10.4 (H) 09/17/2015 1453   ALKPHOS 109 09/17/2015 1453   AST 20 09/17/2015 1453   ALT 11 09/17/2015 1453   BILITOT 0.4 09/17/2015 1453     Impression and Plan: Christina Martin is a pleasant 82 yo white female with history of DVT and a 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 She has done well on Xarelto and will complete 2 years of therapy in April 2018.  She has had issues with fluid overload due to CHF in the past and has noticed some swelling in her legs. She is taking her Lasix as prescribed and plans to follow-up with Dr. Larose Kells if this does not  improve.  We will plan to see her back in 6 months for labs and follow-up. Hopefully, at that time we will be able to transition her from Xarelto to aspirin.  She did receive her flu shot today.  Both she and her daughter know to contact us with any questions or concerns. We can certainly see her sooner if need be.   Eliezer Bottom, NP 10/23/20171:36 PM

## 2016-03-15 ENCOUNTER — Ambulatory Visit: Payer: Medicare Other | Admitting: Family

## 2016-03-15 ENCOUNTER — Other Ambulatory Visit: Payer: Medicare Other

## 2016-04-04 DIAGNOSIS — N312 Flaccid neuropathic bladder, not elsewhere classified: Secondary | ICD-10-CM | POA: Diagnosis not present

## 2016-04-08 ENCOUNTER — Other Ambulatory Visit: Payer: Self-pay | Admitting: Cardiovascular Disease

## 2016-04-17 ENCOUNTER — Encounter: Payer: Self-pay | Admitting: Internal Medicine

## 2016-04-17 ENCOUNTER — Ambulatory Visit (INDEPENDENT_AMBULATORY_CARE_PROVIDER_SITE_OTHER): Payer: Medicare Other | Admitting: Internal Medicine

## 2016-04-17 VITALS — BP 115/55 | HR 79 | Temp 98.6°F | Ht 62.0 in | Wt 237.4 lb

## 2016-04-17 DIAGNOSIS — I10 Essential (primary) hypertension: Secondary | ICD-10-CM

## 2016-04-17 DIAGNOSIS — I872 Venous insufficiency (chronic) (peripheral): Secondary | ICD-10-CM | POA: Diagnosis not present

## 2016-04-17 DIAGNOSIS — I82512 Chronic embolism and thrombosis of left femoral vein: Secondary | ICD-10-CM | POA: Diagnosis not present

## 2016-04-17 NOTE — Progress Notes (Signed)
Subjective:    Patient ID: Christina Martin, female    DOB: 01/20/1925, 82 y.o.   MRN: [id number]  DOS:  04/17/2016 Type of visit - description : Routine office visit Good  medication compliance, no apparent side effects  No recent ambulatory BPs Her only concern is the lower extremity edema, seems to be slightly worse but not a major problem. Denies eating more salt, trying to healthier, keeks her legs elevated throughout the day on-off. Note from oncology reviewed. History of COPD, currently not taking any medications and asymptomatic  Review of Systems   Past Medical History:  Diagnosis Date  . ATONIC BLADDER-- has a permananet suprapubic catheter    . Calculus in urethra    Dr. Diona Fanti  . Diastolic dysfunction, left ventricle 05/30/2013   05/2013 see ECHO   . DJD (degenerative joint disease)   . GERD (gastroesophageal reflux disease)   . HTN (hypertension) 12/30/2007  . Nonspecific elevation of levels of transaminase or lactic acid dehydrogenase (LDH) 1996   + Hepatitis srology  . Prediabetes 04/07/2014  . Uterine cancer  (h/O)    no recent f/u     Past Surgical History:  Procedure Laterality Date  . APPENDECTOMY    . cataract surgery  2014   bilateral; Dr Prudencio Burly  . COLONOSCOPY W/ POLYPECTOMY  1998   Tics in 2006; Dr Sharlett Iles  . CYSTOSCOPY  02/28/2016   3-4 stones approximately 1cm located in posterior bladder, no tumors seen  . ESOPHAGEAL DILATION  2006  . POSTERIOR LAMINECTOMY / DECOMPRESSION LUMBAR SPINE  2009   Dr Lorin Mercy  . SBO  1991   adhesions resected  . TOTAL ABDOMINAL HYSTERECTOMY W/ BILATERAL SALPINGOOPHORECTOMY  1990   uterine cancer  . TOTAL KNEE ARTHROPLASTY  1996 & 2002    Social History   Social History  . Marital status: Widowed    Spouse name: N/A  . Number of children: 3  . Years of education: N/A   Occupational History  . Retired-office work    Social History Main Topics  . Smoking status: Former Smoker    Quit date: 05/22/1986  .  Smokeless tobacco: Never Used     Comment: smoked 1944-1988 , up to 1 ppd  . Alcohol use No  . Drug use: No  . Sexual activity: Not on file   Other Topics Concern  . Not on file   Social History Narrative   Lives by herself, doesn't drive, family in the area           Medication List       Accurate as of 04/17/16 11:59 PM. Always use your most recent med list.          acetaminophen 325 MG tablet Commonly known as:  TYLENOL Take 650 mg by mouth every 6 (six) hours as needed (pain).   augmented betamethasone dipropionate 0.05 % cream Commonly known as:  DIPROLENE-AF Apply topically 2 (two) times daily.   benazepril 40 MG tablet Commonly known as:  LOTENSIN Take 1 tablet (40 mg total) by mouth daily.   CALCIUM 600 + D PO Take 2 capsules by mouth daily. \   CITRUCEL PO Take 2 tablets by mouth daily as needed (constipation). Reported on 07/09/2015   Fish Oil 1200 MG Caps Take 1 capsule by mouth daily.   furosemide 80 MG tablet Commonly known as:  LASIX TAKE 1 TABLET (80 MG TOTAL) BY MOUTH 2 (TWO) TIMES DAILY.   KLOR-CON M20 20 MEQ tablet  Generic drug:  potassium chloride SA TAKE 1 TABLET BY MOUTH DAILY   lactulose 10 GM/15ML solution Commonly known as:  CHRONULAC Take 15 mLs (10 g total) by mouth daily as needed for mild constipation.   multivitamin tablet Take 1 tablet by mouth daily.   omeprazole 20 MG capsule Commonly known as:  PRILOSEC Take 20 mg by mouth daily. Take only on Sun, Tues,Thurs, Sat   rivaroxaban 20 MG Tabs tablet Commonly known as:  XARELTO Take 10 mg by mouth daily with supper.   sodium chloride irrigation 0.9 % irrigation Irrigate with as directed once.   STOOL SOFTENER PO Take 1 capsule by mouth daily.   traMADol 50 MG tablet Commonly known as:  ULTRAM Take 1 tablet (50 mg total) by mouth 3 (three) times daily as needed for moderate pain.          Objective:   Physical Exam BP (!) 115/55 (BP Location: Right Arm,  Patient Position: Sitting, Cuff Size: Large)   Pulse 79   Temp 98.6 F (37 C) (Oral)   Ht 5\' 2"  (1.575 m)   Wt 237 lb 6.4 oz (107.7 kg)   SpO2 100% Comment: RA  BMI 43.42 kg/m  General:   Well developed, well nourished . NAD.  HEENT:  Normocephalic . Face symmetric, atraumatic. Ears normal without wax. Lungs:  CTA B Normal respiratory effort, no intercostal retractions, no accessory muscle use. Heart: RRR,  no murmur.  +/+++ pretibial edema bilaterally  Skin: + Pretibial redness, unchanged from previous, no warm or tender to touch. Neurologic:  alert & oriented X3.  Speech normal, gait appropriate for age and unassisted Psych--  Cognition and judgment appear intact.  Cooperative with normal attention span and concentration.  Behavior appropriate. No anxious or depressed appearing.      Assessment & Plan:    Assessment>  Prediabetes w/ neuropathy ( + sx and decreased pinprick examination 03-2015) Mild renal failure, creatinine 1.4 baseline  HTN DJD -- tramadol prn  COPD: See PFTs 05-2015 GI: --GERD with Esophageal stricture  --constipation L leg DVT 08-2014, saw hematology 12-2014, idiopathic,  xarelto decreased to 1/2 dose [id number] CHF (diastolic)---echo [id number] diastolic dysfx, LVH, nl EF Dr Angelena Form -- dry weight ~ 250  Edema, chronic LE  Stasis dermatitis -- diprolene, lasix GU: ---Atonic  bladder, permanent suprapubic catheter ---Renal stones h/oUterine cancer  TAH/BSO (1990s) H/p Elevated LFTs (1990s, h/o  + hep serology) H/o admitted 03-2015 with edema, felt not to be CHF   PLAN HTN: Last BMP satisfactory, continue with Lotensin, Lasix and potassium. DJD: On tramadol as needed, contract and UDS today COPD: Asx not using inhalers. DVT, left leg: Saw oncology, plan is to continue Xarelto until April 2018, then change to aspirin ? Edema and stasis dermatitis: Edema slightly worse today, weight has increased only 2 pounds. Recommend to increase temporarily Lasix by  half tablet daily x 1 week. Emphasized leg elevation low-salt diet. Intolerant to compression stockings. Call if not better, consider adding another diuretic (spironolactone?) RTC 3 months mostly to check on edema

## 2016-04-17 NOTE — Progress Notes (Signed)
Pre visit review using our clinic review tool, if applicable. No additional management support is needed unless otherwise documented below in the visit note. 

## 2016-04-17 NOTE — Patient Instructions (Addendum)
GO TO THE LAB AND PROVIDE A URINE SAMPLE (uds)  GO TO THE FRONT DESK Schedule your next appointment for a  Check up in 3 months    FOR ONE WEEK TAKE A EXTRA 1/2 TABLET OF LASIX IN THE MORNING  (1.5 TAB IN am, 1 TAB IN THE AFTERNOON)

## 2016-04-18 NOTE — Assessment & Plan Note (Signed)
HTN: Last BMP satisfactory, continue with Lotensin, Lasix and potassium. DJD: On tramadol as needed, contract and UDS today COPD: Asx not using inhalers. DVT, left leg: Saw oncology, plan is to continue Xarelto until April 2018, then change to aspirin ? Edema and stasis dermatitis: Edema slightly worse today, weight has increased only 2 pounds. Recommend to increase temporarily Lasix by half tablet daily x 1 week. Emphasized leg elevation low-salt diet. Intolerant to compression stockings. Call if not better, consider adding another diuretic (spironolactone?) RTC 3 months mostly to check on edema

## 2016-04-20 ENCOUNTER — Encounter: Payer: Self-pay | Admitting: Internal Medicine

## 2016-04-20 DIAGNOSIS — Z79891 Long term (current) use of opiate analgesic: Secondary | ICD-10-CM | POA: Diagnosis not present

## 2016-04-27 ENCOUNTER — Telehealth: Payer: Self-pay

## 2016-04-27 NOTE — Telephone Encounter (Signed)
UDS: 04/20/2016   Positive for Hydrocodone   Low risk per Dr. Larose Kells 04/27/2016

## 2016-05-02 DIAGNOSIS — N312 Flaccid neuropathic bladder, not elsewhere classified: Secondary | ICD-10-CM | POA: Diagnosis not present

## 2016-05-07 ENCOUNTER — Other Ambulatory Visit: Payer: Self-pay | Admitting: Cardiovascular Disease

## 2016-05-23 ENCOUNTER — Other Ambulatory Visit: Payer: Self-pay | Admitting: Internal Medicine

## 2016-05-23 NOTE — Telephone Encounter (Signed)
Pt is requesting refill on Tramadol.  Last OV: 04/17/2016 Last Fill: 02/10/2016 #90 and 1RF Pt sig: 1 tab tid prn UDS: 04/17/2016 Low risk  Please advise.

## 2016-05-23 NOTE — Telephone Encounter (Signed)
Rx faxed to [facility] pharmacy.  

## 2016-05-23 NOTE — Telephone Encounter (Signed)
Rx printed, awaiting MD signature.  

## 2016-05-23 NOTE — Telephone Encounter (Signed)
Ok 90. No RF

## 2016-06-06 DIAGNOSIS — N312 Flaccid neuropathic bladder, not elsewhere classified: Secondary | ICD-10-CM | POA: Diagnosis not present

## 2016-06-12 ENCOUNTER — Other Ambulatory Visit: Payer: Self-pay | Admitting: Hematology & Oncology

## 2016-06-23 ENCOUNTER — Encounter: Payer: Self-pay | Admitting: Internal Medicine

## 2016-06-23 ENCOUNTER — Inpatient Hospital Stay (HOSPITAL_COMMUNITY)
Admission: AD | Admit: 2016-06-23 | Discharge: 2016-07-20 | DRG: 291 | Disposition: E | Payer: Medicare Other | Source: Ambulatory Visit | Attending: Internal Medicine | Admitting: Internal Medicine

## 2016-06-23 ENCOUNTER — Ambulatory Visit (INDEPENDENT_AMBULATORY_CARE_PROVIDER_SITE_OTHER): Payer: Medicare Other | Admitting: Internal Medicine

## 2016-06-23 VITALS — BP 132/70 | HR 65 | Temp 98.1°F | Resp 16 | Ht 62.0 in | Wt 252.1 lb

## 2016-06-23 DIAGNOSIS — I480 Paroxysmal atrial fibrillation: Secondary | ICD-10-CM | POA: Diagnosis not present

## 2016-06-23 DIAGNOSIS — Z4901 Encounter for fitting and adjustment of extracorporeal dialysis catheter: Secondary | ICD-10-CM | POA: Diagnosis not present

## 2016-06-23 DIAGNOSIS — Z8542 Personal history of malignant neoplasm of other parts of uterus: Secondary | ICD-10-CM

## 2016-06-23 DIAGNOSIS — Z833 Family history of diabetes mellitus: Secondary | ICD-10-CM | POA: Diagnosis not present

## 2016-06-23 DIAGNOSIS — Z66 Do not resuscitate: Secondary | ICD-10-CM | POA: Diagnosis not present

## 2016-06-23 DIAGNOSIS — N170 Acute kidney failure with tubular necrosis: Secondary | ICD-10-CM | POA: Diagnosis present

## 2016-06-23 DIAGNOSIS — R0602 Shortness of breath: Secondary | ICD-10-CM | POA: Diagnosis not present

## 2016-06-23 DIAGNOSIS — K219 Gastro-esophageal reflux disease without esophagitis: Secondary | ICD-10-CM | POA: Diagnosis present

## 2016-06-23 DIAGNOSIS — M1990 Unspecified osteoarthritis, unspecified site: Secondary | ICD-10-CM | POA: Diagnosis present

## 2016-06-23 DIAGNOSIS — E875 Hyperkalemia: Secondary | ICD-10-CM | POA: Diagnosis not present

## 2016-06-23 DIAGNOSIS — E872 Acidosis: Secondary | ICD-10-CM | POA: Diagnosis present

## 2016-06-23 DIAGNOSIS — K5900 Constipation, unspecified: Secondary | ICD-10-CM | POA: Diagnosis present

## 2016-06-23 DIAGNOSIS — R1310 Dysphagia, unspecified: Secondary | ICD-10-CM | POA: Diagnosis present

## 2016-06-23 DIAGNOSIS — J9601 Acute respiratory failure with hypoxia: Secondary | ICD-10-CM | POA: Diagnosis not present

## 2016-06-23 DIAGNOSIS — Z96659 Presence of unspecified artificial knee joint: Secondary | ICD-10-CM | POA: Diagnosis present

## 2016-06-23 DIAGNOSIS — N179 Acute kidney failure, unspecified: Secondary | ICD-10-CM | POA: Diagnosis not present

## 2016-06-23 DIAGNOSIS — I509 Heart failure, unspecified: Secondary | ICD-10-CM

## 2016-06-23 DIAGNOSIS — R748 Abnormal levels of other serum enzymes: Secondary | ICD-10-CM | POA: Diagnosis not present

## 2016-06-23 DIAGNOSIS — I248 Other forms of acute ischemic heart disease: Secondary | ICD-10-CM | POA: Diagnosis present

## 2016-06-23 DIAGNOSIS — Z7901 Long term (current) use of anticoagulants: Secondary | ICD-10-CM

## 2016-06-23 DIAGNOSIS — E8770 Fluid overload, unspecified: Secondary | ICD-10-CM

## 2016-06-23 DIAGNOSIS — Z888 Allergy status to other drugs, medicaments and biological substances status: Secondary | ICD-10-CM | POA: Diagnosis not present

## 2016-06-23 DIAGNOSIS — I132 Hypertensive heart and chronic kidney disease with heart failure and with stage 5 chronic kidney disease, or end stage renal disease: Principal | ICD-10-CM | POA: Diagnosis present

## 2016-06-23 DIAGNOSIS — Z800 Family history of malignant neoplasm of digestive organs: Secondary | ICD-10-CM | POA: Diagnosis not present

## 2016-06-23 DIAGNOSIS — I4891 Unspecified atrial fibrillation: Secondary | ICD-10-CM | POA: Diagnosis present

## 2016-06-23 DIAGNOSIS — N186 End stage renal disease: Secondary | ICD-10-CM | POA: Diagnosis present

## 2016-06-23 DIAGNOSIS — I5031 Acute diastolic (congestive) heart failure: Secondary | ICD-10-CM | POA: Diagnosis not present

## 2016-06-23 DIAGNOSIS — R7989 Other specified abnormal findings of blood chemistry: Secondary | ICD-10-CM | POA: Diagnosis present

## 2016-06-23 DIAGNOSIS — I517 Cardiomegaly: Secondary | ICD-10-CM | POA: Diagnosis not present

## 2016-06-23 DIAGNOSIS — R778 Other specified abnormalities of plasma proteins: Secondary | ICD-10-CM | POA: Diagnosis present

## 2016-06-23 DIAGNOSIS — Z9049 Acquired absence of other specified parts of digestive tract: Secondary | ICD-10-CM

## 2016-06-23 DIAGNOSIS — R0600 Dyspnea, unspecified: Secondary | ICD-10-CM

## 2016-06-23 DIAGNOSIS — I82402 Acute embolism and thrombosis of unspecified deep veins of left lower extremity: Secondary | ICD-10-CM | POA: Diagnosis not present

## 2016-06-23 DIAGNOSIS — Z90710 Acquired absence of both cervix and uterus: Secondary | ICD-10-CM

## 2016-06-23 DIAGNOSIS — Z823 Family history of stroke: Secondary | ICD-10-CM | POA: Diagnosis not present

## 2016-06-23 DIAGNOSIS — D631 Anemia in chronic kidney disease: Secondary | ICD-10-CM | POA: Diagnosis not present

## 2016-06-23 DIAGNOSIS — N184 Chronic kidney disease, stage 4 (severe): Secondary | ICD-10-CM | POA: Diagnosis not present

## 2016-06-23 DIAGNOSIS — M7989 Other specified soft tissue disorders: Secondary | ICD-10-CM | POA: Diagnosis not present

## 2016-06-23 DIAGNOSIS — Z87891 Personal history of nicotine dependence: Secondary | ICD-10-CM | POA: Diagnosis not present

## 2016-06-23 DIAGNOSIS — I10 Essential (primary) hypertension: Secondary | ICD-10-CM | POA: Diagnosis not present

## 2016-06-23 DIAGNOSIS — N390 Urinary tract infection, site not specified: Secondary | ICD-10-CM | POA: Diagnosis not present

## 2016-06-23 DIAGNOSIS — L8990 Pressure ulcer of unspecified site, unspecified stage: Secondary | ICD-10-CM | POA: Insufficient documentation

## 2016-06-23 DIAGNOSIS — I481 Persistent atrial fibrillation: Secondary | ICD-10-CM | POA: Diagnosis not present

## 2016-06-23 DIAGNOSIS — Z8249 Family history of ischemic heart disease and other diseases of the circulatory system: Secondary | ICD-10-CM

## 2016-06-23 DIAGNOSIS — I071 Rheumatic tricuspid insufficiency: Secondary | ICD-10-CM | POA: Diagnosis not present

## 2016-06-23 DIAGNOSIS — Z6841 Body Mass Index (BMI) 40.0 and over, adult: Secondary | ICD-10-CM

## 2016-06-23 DIAGNOSIS — N2581 Secondary hyperparathyroidism of renal origin: Secondary | ICD-10-CM | POA: Diagnosis not present

## 2016-06-23 DIAGNOSIS — I82422 Acute embolism and thrombosis of left iliac vein: Secondary | ICD-10-CM | POA: Diagnosis not present

## 2016-06-23 DIAGNOSIS — I5032 Chronic diastolic (congestive) heart failure: Secondary | ICD-10-CM | POA: Diagnosis present

## 2016-06-23 DIAGNOSIS — I340 Nonrheumatic mitral (valve) insufficiency: Secondary | ICD-10-CM | POA: Diagnosis not present

## 2016-06-23 DIAGNOSIS — Z79899 Other long term (current) drug therapy: Secondary | ICD-10-CM

## 2016-06-23 DIAGNOSIS — J9602 Acute respiratory failure with hypercapnia: Secondary | ICD-10-CM | POA: Diagnosis present

## 2016-06-23 DIAGNOSIS — I959 Hypotension, unspecified: Secondary | ICD-10-CM | POA: Diagnosis not present

## 2016-06-23 DIAGNOSIS — I5041 Acute combined systolic (congestive) and diastolic (congestive) heart failure: Secondary | ICD-10-CM | POA: Diagnosis not present

## 2016-06-23 DIAGNOSIS — Z515 Encounter for palliative care: Secondary | ICD-10-CM | POA: Diagnosis not present

## 2016-06-23 DIAGNOSIS — I50810 Right heart failure, unspecified: Secondary | ICD-10-CM | POA: Diagnosis not present

## 2016-06-23 DIAGNOSIS — Z90722 Acquired absence of ovaries, bilateral: Secondary | ICD-10-CM

## 2016-06-23 DIAGNOSIS — Z86718 Personal history of other venous thrombosis and embolism: Secondary | ICD-10-CM

## 2016-06-23 DIAGNOSIS — I5033 Acute on chronic diastolic (congestive) heart failure: Secondary | ICD-10-CM | POA: Diagnosis present

## 2016-06-23 DIAGNOSIS — R600 Localized edema: Secondary | ICD-10-CM | POA: Diagnosis not present

## 2016-06-23 DIAGNOSIS — I878 Other specified disorders of veins: Secondary | ICD-10-CM | POA: Diagnosis present

## 2016-06-23 LAB — PROTIME-INR
INR: 1.13
PROTHROMBIN TIME: 14.5 s (ref 11.4–15.2)

## 2016-06-23 LAB — COMPREHENSIVE METABOLIC PANEL
ALT: 22 U/L (ref 14–54)
ANION GAP: 6 (ref 5–15)
AST: 26 U/L (ref 15–41)
Albumin: 3.2 g/dL — ABNORMAL LOW (ref 3.5–5.0)
Alkaline Phosphatase: 83 U/L (ref 38–126)
BUN: 43 mg/dL — ABNORMAL HIGH (ref 6–20)
CHLORIDE: 101 mmol/L (ref 101–111)
CO2: 29 mmol/L (ref 22–32)
CREATININE: 2.15 mg/dL — AB (ref 0.44–1.00)
Calcium: 9.8 mg/dL (ref 8.9–10.3)
GFR, EST AFRICAN AMERICAN: 22 mL/min — AB (ref >60)
GFR, EST NON AFRICAN AMERICAN: 19 mL/min — AB (ref >60)
Glucose, Bld: 141 mg/dL — ABNORMAL HIGH (ref 65–99)
POTASSIUM: 5.2 mmol/L — AB (ref 3.5–5.1)
Sodium: 136 mmol/L (ref 135–145)
Total Bilirubin: 0.7 mg/dL (ref 0.3–1.2)
Total Protein: 6.3 g/dL — ABNORMAL LOW (ref 6.5–8.1)

## 2016-06-23 LAB — MAGNESIUM: MAGNESIUM: 2.3 mg/dL (ref 1.7–2.4)

## 2016-06-23 LAB — TROPONIN I: Troponin I: 0.04 ng/mL (ref <0.03)

## 2016-06-23 LAB — CBC
HCT: 36.7 % (ref 36.0–46.0)
HEMOGLOBIN: 12.1 g/dL (ref 12.0–15.0)
MCH: 29.3 pg (ref 26.0–34.0)
MCHC: 33 g/dL (ref 30.0–36.0)
MCV: 88.9 fL (ref 78.0–100.0)
Platelets: 230 10*3/uL (ref 150–400)
RBC: 4.13 MIL/uL (ref 3.87–5.11)
RDW: 15.5 % (ref 11.5–15.5)
WBC: 7.1 10*3/uL (ref 4.0–10.5)

## 2016-06-23 LAB — GLUCOSE, CAPILLARY: GLUCOSE-CAPILLARY: 143 mg/dL — AB (ref 65–99)

## 2016-06-23 LAB — TSH: TSH: 1.984 u[IU]/mL (ref 0.350–4.500)

## 2016-06-23 LAB — PHOSPHORUS: PHOSPHORUS: 2.8 mg/dL (ref 2.5–4.6)

## 2016-06-23 MED ORDER — ONDANSETRON HCL 4 MG/2ML IJ SOLN
4.0000 mg | Freq: Four times a day (QID) | INTRAMUSCULAR | Status: DC | PRN
  Administered 2016-07-01: 4 mg via INTRAVENOUS
  Filled 2016-06-23: qty 2

## 2016-06-23 MED ORDER — LACTULOSE 10 GM/15ML PO SOLN: 10.0000 g | Freq: Every day | ORAL | Status: DC | PRN

## 2016-06-23 MED ORDER — PANTOPRAZOLE SODIUM 40 MG PO TBEC
40.0000 mg | DELAYED_RELEASE_TABLET | Freq: Every day | ORAL | Status: DC
  Administered 2016-06-23 – 2016-07-03 (×11): 40 mg via ORAL
  Filled 2016-06-23 (×10): qty 1

## 2016-06-23 MED ORDER — RIVAROXABAN 10 MG PO TABS: 10.0000 mg | ORAL_TABLET | Freq: Every day | ORAL | Status: DC

## 2016-06-23 MED ORDER — SODIUM CHLORIDE 0.9% FLUSH
3.0000 mL | Freq: Two times a day (BID) | INTRAVENOUS | Status: DC
  Administered 2016-06-23 – 2016-07-03 (×15): 3 mL via INTRAVENOUS

## 2016-06-23 MED ORDER — HEPARIN SODIUM (PORCINE) 5000 UNIT/ML IJ SOLN
5000.0000 [IU] | Freq: Three times a day (TID) | INTRAMUSCULAR | Status: DC
  Administered 2016-06-23 – 2016-06-25 (×5): 5000 [IU] via SUBCUTANEOUS
  Filled 2016-06-23 (×5): qty 1

## 2016-06-23 MED ORDER — POTASSIUM CHLORIDE CRYS ER 20 MEQ PO TBCR: 20.0000 meq | EXTENDED_RELEASE_TABLET | Freq: Every day | ORAL | Status: DC

## 2016-06-23 MED ORDER — BENAZEPRIL HCL 40 MG PO TABS: 40.0000 mg | ORAL_TABLET | Freq: Every day | ORAL | Status: DC

## 2016-06-23 MED ORDER — FUROSEMIDE 10 MG/ML IJ SOLN
60.0000 mg | Freq: Two times a day (BID) | INTRAMUSCULAR | Status: DC
  Administered 2016-06-23: 60 mg via INTRAVENOUS
  Filled 2016-06-23 (×2): qty 6

## 2016-06-23 MED ORDER — ACETAMINOPHEN 325 MG PO TABS: 650.0000 mg | ORAL_TABLET | Freq: Four times a day (QID) | ORAL | Status: DC | PRN

## 2016-06-23 MED ORDER — TRAMADOL HCL 50 MG PO TABS
50.0000 mg | ORAL_TABLET | Freq: Three times a day (TID) | ORAL | Status: DC | PRN
  Administered 2016-06-24 – 2016-07-02 (×12): 50 mg via ORAL
  Filled 2016-06-23 (×12): qty 1

## 2016-06-23 MED ORDER — ONDANSETRON HCL 4 MG PO TABS: 4.0000 mg | ORAL_TABLET | Freq: Four times a day (QID) | ORAL | Status: DC | PRN

## 2016-06-23 NOTE — Progress Notes (Signed)
Pt direct admit from MD's office.  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  Up to the floor via w/c to her room.  Oriented her to room.  Call bell at reach.  Instructed her to call for assistance.  Verbalized understanding.  Karie Kirks, Therapist, sports.

## 2016-06-23 NOTE — Patient Instructions (Signed)
Please go to [facility] for a direct admission, I spoke with Dr. Aggie Moats

## 2016-06-23 NOTE — Progress Notes (Signed)
Pre visit review using our clinic review tool, if applicable. No additional management support is needed unless otherwise documented below in the visit note. 

## 2016-06-23 NOTE — H&P (Addendum)
History and Physical    Christina Martin [id number] DOB: Aug 11, 1924 DOA: 06/22/2016  Referring MD/NP/PA: Dr. Kathlene Martin  PCP: Christina November, MD   Outpatient Specialists:   Patient coming from: Med center [facility]   Chief Complaint: leg swelling, weakness  HPI: Christina Martin is a 83 y.o. female with medical history significant for chronic diastolic CHF (last 2 D ECHO in 03/2015 showed EF 65% and grade 1 DD), CKD stage 4 with baseline creatinine 1.80 back in 03/2015, hypertension, GERD. She presented to [facility] with worsening lower extremity swelling and difficulty ambulating over past 2 weeks prior to this admission. She also had shortness of breath with exertion. She took extra dose of lasix for past 1 week without significant symptomatic relief. No reports of chest pain, no fevers, no cough. No abdominal pain, nausea or vomiting. No falls, no lightheadedness. No diarrhea. No reports of blood in stool or urine.  ED Course: Pt was hemodynamically stable. Her blood work showed potassium of 5.2, Cr 2.15, troponin 0.04. The 12 lead EKG not yet done. She requires IV lasix for which reason she was transferred to [facility] for further management.   Review of Systems:  Constitutional: Negative for fever, chills, diaphoresis, activity change, appetite change and fatigue.  HENT: Negative for ear pain, nosebleeds, congestion, facial swelling, rhinorrhea, neck pain, neck stiffness and ear discharge.   Eyes: Negative for pain, discharge, redness, itching and visual disturbance.  Respiratory: Negative for cough, choking, chest tightness, wheezing and stridor.   Cardiovascular: Negative for chest pain, palpitations and positive for leg swelling.  Gastrointestinal: Negative for abdominal distention.  Genitourinary: Negative for dysuria, urgency, frequency, hematuria, flank pain, decreased urine volume, difficulty urinating and dyspareunia.  Musculoskeletal: Negative for back pain, joint swelling,  arthralgias and gait problem.  Neurological: Negative for dizziness, tremors, seizures, syncope, facial asymmetry, speech difficulty, weakness, light-headedness, numbness and headaches.  Hematological: Negative for adenopathy. Does not bruise/bleed easily.  Psychiatric/Behavioral: Negative for hallucinations, behavioral problems, confusion, dysphoric mood, decreased concentration and agitation.   Past Medical History:  Diagnosis Date  . ATONIC BLADDER-- has a permananet suprapubic catheter    . Calculus in urethra    Dr. Diona Martin  . Diastolic dysfunction, left ventricle 05/30/2013   05/2013 see ECHO   . DJD (degenerative joint disease)   . GERD (gastroesophageal reflux disease)   . HTN (hypertension) 12/30/2007  . Nonspecific elevation of levels of transaminase or lactic acid dehydrogenase (LDH) 1996   + Hepatitis srology  . Prediabetes 04/07/2014  . Uterine cancer  (h/O)    no recent f/u     Past Surgical History:  Procedure Laterality Date  . APPENDECTOMY    . cataract surgery  2014   bilateral; Dr Christina Martin  . COLONOSCOPY W/ POLYPECTOMY  1998   Tics in 2006; Dr Christina Martin  . CYSTOSCOPY  02/28/2016   3-4 stones approximately 1cm located in posterior bladder, no tumors seen  . ESOPHAGEAL DILATION  2006  . POSTERIOR LAMINECTOMY / DECOMPRESSION LUMBAR SPINE  2009   Dr Christina Martin  . SBO  1991   adhesions resected  . TOTAL ABDOMINAL HYSTERECTOMY W/ BILATERAL SALPINGOOPHORECTOMY  1990   uterine cancer  . TOTAL KNEE ARTHROPLASTY  1996 & 2002    Social history:  reports that she quit smoking about 30 years ago. She has never used smokeless tobacco. She reports that she does not drink alcohol or use drugs.  Ambulation: ambulates without the assistance at baseline  Allergies  Allergen Reactions  . Lithostat [Acetohydroxamic Acid] Other (See Comments)    Unusual tiredness, weakness, nervousness, mental confusion, sore throat, Poor appetite    Family History  Problem Relation Age of  Onset  . Colon cancer Mother   . Diabetes Mother     mother, brother  . Stroke Mother     > 31  . Liver disease Sister     non hepatitis  . Heart failure Sister   . CAD Brother     brother, mother , sister  . Breast cancer Neg Hx     Prior to Admission medications   Medication Sig Start Date End Date Taking? Authorizing Provider  acetaminophen (TYLENOL) 325 MG tablet Take 650 mg by mouth every 6 (six) hours as needed (pain).     Historical Provider, MD  augmented betamethasone dipropionate (DIPROLENE-AF) 0.05 % cream Apply topically 2 (two) times daily. Patient taking differently: Apply topically once a week.  09/01/15   Colon Branch, MD  benazepril (LOTENSIN) 40 MG tablet Take 1 tablet (40 mg total) by mouth daily. 03/13/16   Colon Branch, MD  Calcium Carbonate-Vitamin D (CALCIUM 600 + D PO) Take 2 capsules by mouth daily. \    Historical Provider, MD  Docusate Calcium (STOOL SOFTENER PO) Take 1 capsule by mouth daily.     Historical Provider, MD  furosemide (LASIX) 80 MG tablet Take 1 tablet (80 mg total) by mouth 2 (two) times daily. 05/09/16   Christina Blanks, MD  lactulose (CHRONULAC) 10 GM/15ML solution Take 15 mLs (10 g total) by mouth daily as needed for mild constipation. 09/01/14   Colon Branch, MD  Methylcellulose, Laxative, (CITRUCEL PO) Take 2 tablets by mouth daily as needed (constipation). Reported on 07/09/2015    Historical Provider, MD  Multiple Vitamin (MULTIVITAMIN) tablet Take 1 tablet by mouth daily.      Historical Provider, MD  Omega-3 Fatty Acids (FISH OIL) 1200 MG CAPS Take 1 capsule by mouth daily.     Historical Provider, MD  omeprazole (PRILOSEC) 20 MG capsule Take 20 mg by mouth daily. Take only on Sun, Tues,Thurs, Sat    Historical Provider, MD  potassium chloride SA (KLOR-CON M20) 20 MEQ tablet Take 1 tablet (20 mEq total) by mouth daily. 05/09/16   Christina Blanks, MD  sodium chloride irrigation 0.9 % irrigation Irrigate with as directed once.     Historical Provider, MD  traMADol (ULTRAM) 50 MG tablet Take 1 tablet (50 mg total) by mouth 3 (three) times daily as needed. 05/23/16   Colon Branch, MD  XARELTO 20 MG TABS tablet TAKE 1 TABLET (20 MG TOTAL) BY MOUTH DAILY WITH SUPPER. 06/12/16   Volanda Napoleon, MD    Physical Exam: Vitals:   06/29/2016 1751  Weight: 112 kg (247 lb)  Height: 5\' 2"  (1.575 m)    Constitutional: NAD, calm, comfortable Vitals:   07/17/2016 1751  Weight: 112 kg (247 lb)  Height: 5\' 2"  (1.575 m)   Eyes: PERRL, lids and conjunctivae normal ENMT: Mucous membranes are moist. Posterior pharynx clear of any exudate or lesions.Normal dentition.  Neck: normal, supple, no masses, no thyromegaly Respiratory: diminished but no wheezing, no rhonchi  Cardiovascular: irregular rhythm, tachycardic, (+) JVD Abdomen: no tenderness, no masses palpated. No hepatosplenomegaly. Bowel sounds positive.  Musculoskeletal: no clubbing / cyanosis. No joint deformity upper and lower extremities. +3 LE edema Skin: no rashes, lesions, ulcers. No induration Neurologic: CN 2-12 grossly intact. Sensation intact, DTR  normal. Strength 5/5 in all 4.  Psychiatric: Normal judgment and insight. Alert and oriented x 3. Normal mood.   Labs on Admission: I have personally reviewed following labs and imaging studies  CBC: No results for input(s): WBC, NEUTROABS, HGB, HCT, MCV, PLT in the last 168 hours. Basic Metabolic Panel: No results for input(s): NA, K, CL, CO2, GLUCOSE, BUN, CREATININE, CALCIUM, MG, PHOS in the last 168 hours. GFR: CrCl cannot be calculated (Patient's most recent lab result is older than the maximum 21 days allowed.). Liver Function Tests: No results for input(s): AST, ALT, ALKPHOS, BILITOT, PROT, ALBUMIN in the last 168 hours. No results for input(s): LIPASE, AMYLASE in the last 168 hours. No results for input(s): AMMONIA in the last 168 hours. Coagulation Profile: No results for input(s): INR, PROTIME in the last 168  hours. Cardiac Enzymes: No results for input(s): CKTOTAL, CKMB, CKMBINDEX, TROPONINI in the last 168 hours. BNP (last 3 results) No results for input(s): PROBNP in the last 8760 hours. HbA1C: No results for input(s): HGBA1C in the last 72 hours. CBG: No results for input(s): GLUCAP in the last 168 hours. Lipid Profile: No results for input(s): CHOL, HDL, LDLCALC, TRIG, CHOLHDL, LDLDIRECT in the last 72 hours. Thyroid Function Tests: No results for input(s): TSH, T4TOTAL, FREET4, T3FREE, THYROIDAB in the last 72 hours. Anemia Panel: No results for input(s): VITAMINB12, FOLATE, FERRITIN, TIBC, IRON, RETICCTPCT in the last 72 hours. Urine analysis: No results found for: COLORURINE, APPEARANCEUR, LABSPEC, PHURINE, GLUCOSEU, HGBUR, BILIRUBINUR, KETONESUR, PROTEINUR, UROBILINOGEN, NITRITE, LEUKOCYTESUR Sepsis Labs: @LABRCNTIP (procalcitonin:4,lacticidven:4) )No results found for this or any previous visit (from the past 240 hour(s)).   Radiological Exams on Admission: No results found.  EKG: not yet done, pending   Assessment/Plan    Principal Problem:   Lower extremity edema / Acute on chronic diastolic heart failure (HCC) - ECHO in 03/2015 showed grade 1 DD and preserved EF - Repeat ECHO on this admission - Started lasix 60 mg IV Q 12 hours - Monitor daily weight and strict intake and output  - Will need cardio consult in am - Correct electrolytes   Active Problems:   Left leg DVT  08-2014 - Decreased the dose to 10 mg a day by hematology and she was supposed to be on it through 08/2016.  - Xarelto on hold due to worsening renal function - Now on subQ heparin - Will repeat LE doppler in am    Essential hypertension - Lisinopril on hold due to worsening Cr - BP 90/68 so will hold antihypertensives     Hyperkalemia - Due to supplementation - It will likely correct with lasix     Elevated troponin - Likely demand ischemia due to CKD, acute CHF - Cycle cardiac enzymes -  No chest pain - Obtain 12 lead EKG    GERD without esophagitis - Continue protonix     Acute renal failure superimposed on stage 4 chronic kidney disease (HCC) - Cr baseline is 1.8 in 03/2015 and on this admission 2.15 - Monitor renal function since pt on IV lasix    DVT prophylaxis: Hep subQ Code Status: full code  Family Communication: daughter at bedside  Disposition Plan: admission to SDU Consults called: Will need cardio consult in am Admission status: Inpatient; pt with LE edema, history of diastolic CHF, requires aggressive diuresis but also careful renal function monitoring as her Cr seems to be above baseline range. She will require cardio consult in am, repeat 2 D ECHO and cycling cardiac  enzymes. If she does not improve then palliative care might be appropriate.   Leisa Lenz MD [facility] Pager [phone]  If 7PM-7AM, please contact night-coverage [email or web address] Password [email or web address]  06/28/2016, 6:02 PM

## 2016-06-23 NOTE — Progress Notes (Signed)
Subjective:    Patient ID: Christina Martin, female    DOB: Jun 12, 1924, 83 y.o.   MRN: [id number]  DOS:  06/27/2016 Type of visit - description : Acute, here with her daughter Interval history: Chief complaint today is lower extremity edema, increasing for the last 2 weeks. States that she has been trying to be very careful with her diet, drinking plenty of water, not adding any salt to her food. Not taking NSAIDs. A week ago, she temporarily increase Lasix dose without much help. Her legs are stinging, looks swollen but denies any blisters.   Wt Readings from Last 3 Encounters:  06/25/16 244 lb 14.9 oz (111.1 kg)  07/03/2016 252 lb 2 oz (114.4 kg)  04/17/16 237 lb 6.4 oz (107.7 kg)     Review of Systems  Denies fever chills. No chest pain. She has some degree of difficulty breathing all the time, slightly increased for a few days, increased DOE She sleeps in her recliner, it is flat, no orthopnea No wheezing, occasionally has mild cough.  Past Medical History:  Diagnosis Date  . ATONIC BLADDER-- has a permananet suprapubic catheter    . Calculus in urethra    Dr. Diona Fanti  . Diastolic dysfunction, left ventricle 05/30/2013   05/2013 see ECHO   . DJD (degenerative joint disease)   . GERD (gastroesophageal reflux disease)   . HTN (hypertension) 12/30/2007  . Nonspecific elevation of levels of transaminase or lactic acid dehydrogenase (LDH) 1996   + Hepatitis srology  . Prediabetes 04/07/2014  . Uterine cancer  (h/O)    no recent f/u     Past Surgical History:  Procedure Laterality Date  . APPENDECTOMY    . cataract surgery  2014   bilateral; Dr Prudencio Burly  . COLONOSCOPY W/ POLYPECTOMY  1998   Tics in 2006; Dr Sharlett Iles  . CYSTOSCOPY  02/28/2016   3-4 stones approximately 1cm located in posterior bladder, no tumors seen  . ESOPHAGEAL DILATION  2006  . POSTERIOR LAMINECTOMY / DECOMPRESSION LUMBAR SPINE  2009   Dr Lorin Mercy  . SBO  1991   adhesions resected  . TOTAL ABDOMINAL  HYSTERECTOMY W/ BILATERAL SALPINGOOPHORECTOMY  1990   uterine cancer  . TOTAL KNEE ARTHROPLASTY  1996 & 2002    Social History   Social History  . Marital status: Widowed    Spouse name: N/A  . Number of children: 3  . Years of education: N/A   Occupational History  . Retired-office work    Social History Main Topics  . Smoking status: Former Smoker    Quit date: 05/22/1986  . Smokeless tobacco: Never Used     Comment: smoked 1944-1988 , up to 1 ppd  . Alcohol use No  . Drug use: No  . Sexual activity: Not on file   Other Topics Concern  . Not on file   Social History Narrative   Lives by herself, doesn't drive, family in the area         Allergies as of 06/24/2016      Reactions   Lithostat [acetohydroxamic Acid] Other (See Comments)   Unusual tiredness, weakness, nervousness, mental confusion, sore throat, Poor appetite      Medication List       Accurate as of 07/17/2016  5:25 PM. Always use your most recent med list.          acetaminophen 325 MG tablet Commonly known as:  TYLENOL Take 650 mg by mouth every 6 (six) hours as  needed (pain).   augmented betamethasone dipropionate 0.05 % cream Commonly known as:  DIPROLENE-AF Apply topically 2 (two) times daily.   benazepril 40 MG tablet Commonly known as:  LOTENSIN Take 1 tablet (40 mg total) by mouth daily.   CALCIUM 600 + D PO Take 1 tablet by mouth daily. \   CITRUCEL PO Take 2 tablets by mouth daily at 12 noon. Reported on 07/09/2015   Fish Oil 1200 MG Caps Take 1,200 mg by mouth daily.   furosemide 80 MG tablet Commonly known as:  LASIX Take 1 tablet (80 mg total) by mouth 2 (two) times daily.   lactulose 10 GM/15ML solution Commonly known as:  CHRONULAC Take 15 mLs (10 g total) by mouth daily as needed for mild constipation.   multivitamin tablet Take 1 tablet by mouth daily.   omeprazole 20 MG capsule Commonly known as:  PRILOSEC Take 20 mg by mouth 4 (four) times a week. Sunday,  Tuesday, Thursday, Saturday   potassium chloride SA 20 MEQ tablet Commonly known as:  KLOR-CON M20 Take 1 tablet (20 mEq total) by mouth daily.   sodium chloride irrigation 0.9 % irrigation Irrigate with as directed once.   sodium chloride irrigation 0.9 % irrigation 70 mLs See admin instructions. Use 70 ml to flush catheter daily   STOOL SOFTENER PO Take 1 capsule by mouth daily.   traMADol 50 MG tablet Commonly known as:  ULTRAM Take 1 tablet (50 mg total) by mouth 3 (three) times daily as needed.   XARELTO 20 MG Tabs tablet Generic drug:  rivaroxaban TAKE 1 TABLET (20 MG TOTAL) BY MOUTH DAILY WITH SUPPER.          Objective:   Physical Exam BP 132/70 (BP Location: Right Arm, Patient Position: Sitting, Cuff Size: Normal)   Pulse 65   Temp 98.1 F (36.7 C) (Oral)   Resp 16   Ht 5\' 2"  (1.575 m)   Wt 252 lb 2 oz (114.4 kg)   SpO2 93%   BMI 46.11 kg/m  General:   Well developed, well nourished noted DOE when she walk from one room to the other.  HEENT:  Normocephalic . Face symmetric, atraumatic Neck: + JVD at 45 Lungs:  Mild wheezing bilaterally. Slightly decreased breath sounds Normal respiratory effort, no intercostal retractions, no accessory muscle use. Heart: Seems irregular on exam,  no murmur.  + to ++/+++ pretibial edema bilaterally , + pretibial redness bilaterally Abdomen:  Not distended, soft, non-tender. No rebound or rigidity.  Skin: Not pale. Not jaundice Neurologic:  alert & oriented X3.  Speech normal, gait appropriate for age and unassisted Psych--  Cognition and judgment appear intact.  Cooperative with normal attention span and concentration.  Behavior appropriate. No anxious or depressed appearing.    Assessment & Plan:   Assessment>  Prediabetes w/ neuropathy ( + sx and decreased pinprick examination 03-2015) Mild renal failure, creatinine 1.4 baseline  HTN DJD -- tramadol prn  COPD: See PFTs 05-2015 GI: --GERD with Esophageal  stricture  --constipation L leg DVT 08-2014, saw hematology 12-2014, idiopathic,  xarelto decreased to 1/2 dose [id number] CHF (diastolic)---echo [id number] diastolic dysfx, LVH, nl EF Dr Angelena Form -- dry weight ~ 250  Edema, chronic LE  Stasis dermatitis -- diprolene, lasix GU: ---Atonic  bladder, permanent suprapubic catheter ---Renal stones h/oUterine cancer  TAH/BSO (1990s) H/p Elevated LFTs (1990s, h/o  + hep serology) H/o admitted 03-2015 with edema, felt not to be CHF   PLAN New onset atrial fibrillation  with worsening CHF. Patient presents with two-week history of increased edema, on exam her heart was  irregularly irregular , EKG consistent with A. fib (discussed with cardiology DOD). Given age, comorbidities, CHF symptoms she needs to be admitted. Situation discussed with the patient and her daughter, multiple questions answered to the best of my ability. Contacted the hospital service, spoke with Dr. Aggie Moats, whom accept the patient to [place]- our EKG machine was down, we used a different MACHINE,  EKG DID NOT FLOW in the system but was sent to be scan. We gave the patient copies of the EKG today.   Today, I spent more than   50 min with the patient: >50% of the time counseling regards what atrial fibrillation is, why she needs to be admitted, coordinating her care, making multiple phone call phone calls to get her direct admitted.

## 2016-06-24 ENCOUNTER — Inpatient Hospital Stay (HOSPITAL_COMMUNITY): Payer: Medicare Other

## 2016-06-24 DIAGNOSIS — I509 Heart failure, unspecified: Secondary | ICD-10-CM

## 2016-06-24 DIAGNOSIS — I82422 Acute embolism and thrombosis of left iliac vein: Secondary | ICD-10-CM

## 2016-06-24 DIAGNOSIS — N184 Chronic kidney disease, stage 4 (severe): Secondary | ICD-10-CM

## 2016-06-24 DIAGNOSIS — K219 Gastro-esophageal reflux disease without esophagitis: Secondary | ICD-10-CM

## 2016-06-24 DIAGNOSIS — R600 Localized edema: Secondary | ICD-10-CM

## 2016-06-24 DIAGNOSIS — I10 Essential (primary) hypertension: Secondary | ICD-10-CM

## 2016-06-24 DIAGNOSIS — N179 Acute kidney failure, unspecified: Secondary | ICD-10-CM

## 2016-06-24 DIAGNOSIS — I4891 Unspecified atrial fibrillation: Secondary | ICD-10-CM

## 2016-06-24 DIAGNOSIS — R748 Abnormal levels of other serum enzymes: Secondary | ICD-10-CM

## 2016-06-24 DIAGNOSIS — M7989 Other specified soft tissue disorders: Secondary | ICD-10-CM

## 2016-06-24 DIAGNOSIS — I5031 Acute diastolic (congestive) heart failure: Secondary | ICD-10-CM

## 2016-06-24 LAB — URINALYSIS, ROUTINE W REFLEX MICROSCOPIC
Bilirubin Urine: NEGATIVE
Glucose, UA: NEGATIVE mg/dL
KETONES UR: NEGATIVE mg/dL
Nitrite: NEGATIVE
PH: 5 (ref 5.0–8.0)
PROTEIN: NEGATIVE mg/dL
Specific Gravity, Urine: 1.013 (ref 1.005–1.030)

## 2016-06-24 LAB — BASIC METABOLIC PANEL
Anion gap: 5 (ref 5–15)
BUN: 43 mg/dL — AB (ref 6–20)
CHLORIDE: 103 mmol/L (ref 101–111)
CO2: 28 mmol/L (ref 22–32)
CREATININE: 2.24 mg/dL — AB (ref 0.44–1.00)
Calcium: 9.3 mg/dL (ref 8.9–10.3)
GFR calc Af Amer: 21 mL/min — ABNORMAL LOW (ref >60)
GFR calc non Af Amer: 18 mL/min — ABNORMAL LOW (ref >60)
Glucose, Bld: 131 mg/dL — ABNORMAL HIGH (ref 65–99)
Potassium: 4.7 mmol/L (ref 3.5–5.1)
SODIUM: 136 mmol/L (ref 135–145)

## 2016-06-24 LAB — CBC
HCT: 33 % — ABNORMAL LOW (ref 36.0–46.0)
HEMOGLOBIN: 10.6 g/dL — AB (ref 12.0–15.0)
MCH: 28.8 pg (ref 26.0–34.0)
MCHC: 32.1 g/dL (ref 30.0–36.0)
MCV: 89.7 fL (ref 78.0–100.0)
Platelets: 212 10*3/uL (ref 150–400)
RBC: 3.68 MIL/uL — AB (ref 3.87–5.11)
RDW: 15.7 % — ABNORMAL HIGH (ref 11.5–15.5)
WBC: 6.6 10*3/uL (ref 4.0–10.5)

## 2016-06-24 LAB — TROPONIN I
TROPONIN I: 0.05 ng/mL — AB (ref <0.03)
Troponin I: 0.04 ng/mL (ref <0.03)

## 2016-06-24 LAB — ECHOCARDIOGRAM COMPLETE
Height: 62 in
WEIGHTICAEL: 3939.2 [oz_av]

## 2016-06-24 NOTE — Progress Notes (Signed)
Echocardiogram 2D Echocardiogram has been performed.  Aggie Cosier 06/24/2016, 11:46 AM

## 2016-06-24 NOTE — Consult Note (Signed)
CONSULTATION NOTE  Reason for Consult: Atrial fibrillation  Requesting Physician: Dr. Elisabeth Pigeon   Cardiologist: Dr. Clifton James  HPI: This is a 83 y.o. female with a past medical history significant for HTN, CKD, neurogenic bladder (with suprapubic catheter), diastolic HF and history of DVT in 2016 on Xarelto. She was admitted to [facility] in 2016 for LE edema and treated as lymphedema and cellulitis. Echo in 2016 showed LVEF of 65-70% with grade 1 DD and moderate LAE, mild RAE and moderate pulmonary hypertension with RVSP of 53 mmHg.  She now presents with leg swelling and weakness. Noted to be hypotensive on admission. Lasix 60 mg IV q12 hrs was started. Troponin was noted to be flat at 0.04, 0.05 and 0.04. Vascular ultrasound performed and no acute DVT was noted in the LLE. EKG this morning shows new onset a-fib with RVR. Xarelto has been on hold due to creatinine of 2.15 (baseline 1.8). Echo performed today personally reviewed - LVEF stable at 60-65%, moderate LAE and severe RAE, moderate TR with RVSP 39 mmHg, dilated IVC, mild RV dysfunction. She reports being asymptomatic with her a-fib, denies any chest pain.  PMHx:  Past Medical History:  Diagnosis Date  . ATONIC BLADDER-- has a permananet suprapubic catheter    . Calculus in urethra    Dr. Retta Diones  . Diastolic dysfunction, left ventricle 05/30/2013   05/2013 see ECHO   . DJD (degenerative joint disease)   . GERD (gastroesophageal reflux disease)   . HTN (hypertension) 12/30/2007  . Nonspecific elevation of levels of transaminase or lactic acid dehydrogenase (LDH) 1996   + Hepatitis srology  . Prediabetes 04/07/2014  . Uterine cancer  (h/O)    no recent f/u    Past Surgical History:  Procedure Laterality Date  . APPENDECTOMY    . cataract surgery  2014   bilateral; Dr Randon Goldsmith  . COLONOSCOPY W/ POLYPECTOMY  1998   Tics in 2006; Dr Jarold Motto  . CYSTOSCOPY  02/28/2016   3-4 stones approximately 1cm located in posterior bladder,  no tumors seen  . ESOPHAGEAL DILATION  2006  . POSTERIOR LAMINECTOMY / DECOMPRESSION LUMBAR SPINE  2009   Dr Ophelia Charter  . SBO  1991   adhesions resected  . TOTAL ABDOMINAL HYSTERECTOMY W/ BILATERAL SALPINGOOPHORECTOMY  1990   uterine cancer  . TOTAL KNEE ARTHROPLASTY  1996 & 2002    FAMHx: Family History  Problem Relation Age of Onset  . Colon cancer Mother   . Diabetes Mother     mother, brother  . Stroke Mother     > 23  . Liver disease Sister     non hepatitis  . Heart failure Sister   . CAD Brother     brother, mother , sister  . Breast cancer Neg Hx     SOCHx:  reports that she quit smoking about 30 years ago. She has never used smokeless tobacco. She reports that she does not drink alcohol or use drugs.  ALLERGIES: Allergies  Allergen Reactions  . Lithostat [Acetohydroxamic Acid] Other (See Comments)    Unusual tiredness, weakness, nervousness, mental confusion, sore throat, Poor appetite    ROS: Pertinent items noted in HPI and remainder of comprehensive ROS otherwise negative.  HOME MEDICATIONS: No current facility-administered medications on file prior to encounter.    Current Outpatient Prescriptions on File Prior to Encounter  Medication Sig Dispense Refill  . benazepril (LOTENSIN) 40 MG tablet Take 1 tablet (40 mg total) by mouth daily. 90 tablet 1  .  Calcium Carbonate-Vitamin D (CALCIUM 600 + D PO) Take 1 tablet by mouth daily. \    . Docusate Calcium (STOOL SOFTENER PO) Take 1 capsule by mouth daily.     . furosemide (LASIX) 80 MG tablet Take 1 tablet (80 mg total) by mouth 2 (two) times daily. 60 tablet 11  . Methylcellulose, Laxative, (CITRUCEL PO) Take 2 tablets by mouth daily at 12 noon. Reported on 07/09/2015    . Omega-3 Fatty Acids (FISH OIL) 1200 MG CAPS Take 1,200 mg by mouth daily.     . potassium chloride SA (KLOR-CON M20) 20 MEQ tablet Take 1 tablet (20 mEq total) by mouth daily. 30 tablet 11  . traMADol (ULTRAM) 50 MG tablet Take 1 tablet (50  mg total) by mouth 3 (three) times daily as needed. (Patient taking differently: Take 50 mg by mouth 2 (two) times daily. ) 90 tablet 0  . XARELTO 20 MG TABS tablet TAKE 1 TABLET (20 MG TOTAL) BY MOUTH DAILY WITH SUPPER. (Patient taking differently: TAKE 1/2 TABLET (10 MG) BY MOUTH DAILY WITH SUPPER (UNTIL OFFICE VISIT APRIL 2018)) 30 tablet 2  . augmented betamethasone dipropionate (DIPROLENE-AF) 0.05 % cream Apply topically 2 (two) times daily. (Patient not taking: Reported on 06/29/2016) 50 g 0  . lactulose (CHRONULAC) 10 GM/15ML solution Take 15 mLs (10 g total) by mouth daily as needed for mild constipation. (Patient not taking: Reported on 07/13/2016) 472 mL 0    HOSPITAL MEDICATIONS: Prior to Admission:  Prescriptions Prior to Admission  Medication Sig Dispense Refill Last Dose  . acetaminophen (TYLENOL) 500 MG tablet Take 1,000 mg by mouth every 6 (six) hours as needed (pain).   07/13/2016 at am  . benazepril (LOTENSIN) 40 MG tablet Take 1 tablet (40 mg total) by mouth daily. 90 tablet 1 06/22/2016 at Unknown time  . Calcium Carbonate-Vitamin D (CALCIUM 600 + D PO) Take 1 tablet by mouth daily. \   07/14/2016 at Unknown time  . Docusate Calcium (STOOL SOFTENER PO) Take 1 capsule by mouth daily.    07/14/2016 at Unknown time  . furosemide (LASIX) 80 MG tablet Take 1 tablet (80 mg total) by mouth 2 (two) times daily. 60 tablet 11 07/02/2016 at am  . hydrocortisone cream 1 % Apply 1 application topically daily as needed for itching (redness/ irritation).   week ago  . Methylcellulose, Laxative, (CITRUCEL PO) Take 2 tablets by mouth daily at 12 noon. Reported on 07/09/2015   06/22/2016 at Unknown time  . Multiple Vitamin (MULTIVITAMIN WITH MINERALS) TABS tablet Take 1 tablet by mouth daily.   07/03/2016 at Unknown time  . Omega-3 Fatty Acids (FISH OIL) 1200 MG CAPS Take 1,200 mg by mouth daily.    07/01/2016 at Unknown time  . omeprazole (PRILOSEC OTC) 20 MG tablet Take 20 mg by mouth 4 (four) times a week.  Sunday, Tuesday, Thursday, Saturday   06/22/2016 at Unknown time  . potassium chloride SA (KLOR-CON M20) 20 MEQ tablet Take 1 tablet (20 mEq total) by mouth daily. 30 tablet 11 07/11/2016 at Unknown time  . sodium chloride irrigation 0.9 % irrigation 70 mLs See admin instructions. Use 70 ml to flush catheter daily  5 06/26/2016 at Unknown time  . traMADol (ULTRAM) 50 MG tablet Take 1 tablet (50 mg total) by mouth 3 (three) times daily as needed. (Patient taking differently: Take 50 mg by mouth 2 (two) times daily. ) 90 tablet 0 07/05/2016 at am  . VITAMIN E PO Take 1 capsule  by mouth daily.   06/28/2016 at Unknown time  . XARELTO 20 MG TABS tablet TAKE 1 TABLET (20 MG TOTAL) BY MOUTH DAILY WITH SUPPER. (Patient taking differently: TAKE 1/2 TABLET (10 MG) BY MOUTH DAILY WITH SUPPER (UNTIL OFFICE VISIT APRIL 2018)) 30 tablet 2 06/22/2016 at Unknown time  . augmented betamethasone dipropionate (DIPROLENE-AF) 0.05 % cream Apply topically 2 (two) times daily. (Patient not taking: Reported on 06/28/2016) 50 g 0 Not Taking at Unknown time  . lactulose (CHRONULAC) 10 GM/15ML solution Take 15 mLs (10 g total) by mouth daily as needed for mild constipation. (Patient not taking: Reported on 07/03/2016) 472 mL 0 Not Taking at Unknown time    VITALS: Blood pressure (!) 87/66, pulse 71, temperature 97.6 F (36.4 C), temperature source Oral, resp. rate 20, height '5\' 2"'$  (1.575 m), weight 246 lb 3.2 oz (111.7 kg), SpO2 93 %.  PHYSICAL EXAM: General appearance: alert, no distress and moderately obese Neck: no carotid bruit and no JVD Lungs: clear to auscultation bilaterally Heart: irregularly irregular rhythm and systolic murmur: early systolic 3/6, blowing at 2nd right intercostal space Abdomen: soft, non-tender; bowel sounds normal; no masses,  no organomegaly and protuberant Extremities: venous stasis dermatitis noted and chronic LE stasis, erythma and skin eruptions, 2+ edema Pulses: 2+ and symmetric Skin: Skin color,  texture, turgor normal. No rashes or lesions Neurologic: Grossly normal Psych: Pleasant  LABS: Results for orders placed or performed during the hospital encounter of 07/03/2016 (from the past 48 hour(s))  Comprehensive metabolic panel     Status: Abnormal   Collection Time: 07/06/2016  6:41 PM  Result Value Ref Range   Sodium 136 135 - 145 mmol/L   Potassium 5.2 (H) 3.5 - 5.1 mmol/L   Chloride 101 101 - 111 mmol/L   CO2 29 22 - 32 mmol/L   Glucose, Bld 141 (H) 65 - 99 mg/dL   BUN 43 (H) 6 - 20 mg/dL   Creatinine, Ser 2.15 (H) 0.44 - 1.00 mg/dL   Calcium 9.8 8.9 - 10.3 mg/dL   Total Protein 6.3 (L) 6.5 - 8.1 g/dL   Albumin 3.2 (L) 3.5 - 5.0 g/dL   AST 26 15 - 41 U/L   ALT 22 14 - 54 U/L   Alkaline Phosphatase 83 38 - 126 U/L   Total Bilirubin 0.7 0.3 - 1.2 mg/dL   GFR calc non Af Amer 19 (L) >60 mL/min   GFR calc Af Amer 22 (L) >60 mL/min    Comment: (NOTE) The eGFR has been calculated using the CKD EPI equation. This calculation has not been validated in all clinical situations. eGFR's persistently <60 mL/min signify possible Chronic Kidney Disease.    Anion gap 6 5 - 15  Magnesium     Status: None   Collection Time: 07/06/2016  6:41 PM  Result Value Ref Range   Magnesium 2.3 1.7 - 2.4 mg/dL  Phosphorus     Status: None   Collection Time: 07/12/2016  6:41 PM  Result Value Ref Range   Phosphorus 2.8 2.5 - 4.6 mg/dL  CBC     Status: None   Collection Time: 07/03/2016  6:41 PM  Result Value Ref Range   WBC 7.1 4.0 - 10.5 K/uL   RBC 4.13 3.87 - 5.11 MIL/uL   Hemoglobin 12.1 12.0 - 15.0 g/dL   HCT 36.7 36.0 - 46.0 %   MCV 88.9 78.0 - 100.0 fL   MCH 29.3 26.0 - 34.0 pg   MCHC 33.0 30.0 -  36.0 g/dL   RDW 15.5 11.5 - 15.5 %   Platelets 230 150 - 400 K/uL  Protime-INR     Status: None   Collection Time: 07/19/2016  6:41 PM  Result Value Ref Range   Prothrombin Time 14.5 11.4 - 15.2 seconds   INR 1.13   Troponin I     Status: Abnormal   Collection Time: 07/11/2016  6:41 PM  Result  Value Ref Range   Troponin I 0.04 (HH) <0.03 ng/mL    Comment: CRITICAL RESULT CALLED TO, READ BACK BY AND VERIFIED WITHStarlyn Skeans RN [phone] 1939 GREEN R   TSH     Status: None   Collection Time: 06/22/2016  6:42 PM  Result Value Ref Range   TSH 1.984 0.350 - 4.500 uIU/mL    Comment: Performed by a 3rd Generation assay with a functional sensitivity of <=0.01 uIU/mL.  Glucose, capillary     Status: Abnormal   Collection Time: 07/11/2016  9:35 PM  Result Value Ref Range   Glucose-Capillary 143 (H) 65 - 99 mg/dL   Comment 1 Notify RN    Comment 2 Document in Chart   Troponin I     Status: Abnormal   Collection Time: 06/24/16 12:05 AM  Result Value Ref Range   Troponin I 0.05 (HH) <0.03 ng/mL    Comment: CRITICAL VALUE NOTED.  VALUE IS CONSISTENT WITH PREVIOUSLY REPORTED AND CALLED VALUE.  Troponin I     Status: Abnormal   Collection Time: 06/24/16  9:04 AM  Result Value Ref Range   Troponin I 0.04 (HH) <0.03 ng/mL    Comment: CRITICAL VALUE NOTED.  VALUE IS CONSISTENT WITH PREVIOUSLY REPORTED AND CALLED VALUE.  CBC     Status: Abnormal   Collection Time: 06/24/16  9:04 AM  Result Value Ref Range   WBC 6.6 4.0 - 10.5 K/uL   RBC 3.68 (L) 3.87 - 5.11 MIL/uL   Hemoglobin 10.6 (L) 12.0 - 15.0 g/dL   HCT 33.0 (L) 36.0 - 46.0 %   MCV 89.7 78.0 - 100.0 fL   MCH 28.8 26.0 - 34.0 pg   MCHC 32.1 30.0 - 36.0 g/dL   RDW 15.7 (H) 11.5 - 15.5 %   Platelets 212 150 - 400 K/uL  Basic metabolic panel     Status: Abnormal   Collection Time: 06/24/16  9:04 AM  Result Value Ref Range   Sodium 136 135 - 145 mmol/L   Potassium 4.7 3.5 - 5.1 mmol/L   Chloride 103 101 - 111 mmol/L   CO2 28 22 - 32 mmol/L   Glucose, Bld 131 (H) 65 - 99 mg/dL   BUN 43 (H) 6 - 20 mg/dL   Creatinine, Ser 2.24 (H) 0.44 - 1.00 mg/dL   Calcium 9.3 8.9 - 10.3 mg/dL   GFR calc non Af Amer 18 (L) >60 mL/min   GFR calc Af Amer 21 (L) >60 mL/min    Comment: (NOTE) The eGFR has been calculated using the CKD EPI  equation. This calculation has not been validated in all clinical situations. eGFR's persistently <60 mL/min signify possible Chronic Kidney Disease.    Anion gap 5 5 - 15    IMAGING: No results found.  HOSPITAL DIAGNOSES: Principal Problem:   Lower extremity edema Active Problems:   Left leg DVT  08-2014   Atrial fibrillation (HCC)   Hyperkalemia   Elevated troponin   GERD without esophagitis   Acute renal failure superimposed on stage 4 chronic kidney disease (  [place])   Acute on chronic diastolic heart failure (HCC)   Essential hypertension   IMPRESSION: 1. Acute diastolic CHF, predominant right heart failure 2. New onset a-fib with RVR 3. History of DVT - resolved, on Xarelto 4. Hypotension 5. Elevated troponin - demand ischemia 6. Acute anemia  RECOMMENDATION: 1. Acute diastolic CHF- Appears volume overloaded with predominantly right heart failure symptoms. Echo demonstrates worsening atrial enlargement. I feel like the new a-fib kicked her into CHF. Agree with continued IV lasix. BP meds on hold at the moment due to hypotension. May need to consider inotrope therapy to assist with hypotension and aid in diuresis. 2. Hypotension - etiology is unclear. Afebrile, no leukocytosis. Creatinine acutely rose overnight with diuresis. Hemoglobin is down somewhat, ?GIB. She has a suprapubic catheter and history of neurogenic bladder - would like to r/o UTI. Send UA and occult stool cards. 3. Acute anemia - could this be GIB - check stool cards. Expect hemoconcentration with diuresis. 4. A-fib with RVR - asymptomatic - given her significant atrial enlargement, would be difficult to get into sinus and maintain it without anti-arrhythmic medications. I would favor rate-control strategy with low dose b-blocker as BP tolerates. Her CHADSVASC score is 5. She was previously on Xarelto and tolerated it well, but now being held due to renal dysfunction. She is on prophylactic SQ heparin - would d/c  and change to IV heparin if stool card is negative for GIB. If renal function does not improve, may need to transition to warfarin. 5. History of DVT- appears to have resolved on [place] today. Will need long-term anticoagulation regardless given a-fib.  Thanks for the consultation. Cardiology will follow with you.  Time Spent Directly with Patient: 45 minutes  Pixie Casino, MD, [facility] Attending Cardiologist [place] 06/24/2016, 1:48 PM

## 2016-06-24 NOTE — Progress Notes (Signed)
VASCULAR LAB PRELIMINARY  PRELIMINARY  PRELIMINARY  PRELIMINARY  Left lower extremity venous duplex completed.    Preliminary report:  There is no acute DVT or SVT noted in the left lower extremity.   Oluwafemi Villella, RVT 06/24/2016, 11:11 AM

## 2016-06-24 NOTE — Progress Notes (Signed)
Patients blood pressures have been soft this morning, sat up in the chair for a while and then got 90/50.  Texted doctor regarding 60 mg of IV Lasix this morning.

## 2016-06-24 NOTE — Progress Notes (Signed)
Patient ID: Christina Martin, female   DOB: 09-Sep-1924, 83 y.o.   MRN: [id number]  PROGRESS NOTE    SHONDRIA GARDUNO  [id number] DOB: 15-May-1925 DOA: 06/24/2016  PCP: [name], MD   Brief Narrative:  83 y.o. female with medical history significant for chronic diastolic CHF (last 2 D ECHO in 03/2015 showed EF 65% and grade 1 DD), CKD stage 4 with baseline creatinine 1.80 back in 03/2015, hypertension, GERD. She presented to [facility] with worsening lower extremity swelling and difficulty ambulating over past 2 weeks prior to this admission. She also had shortness of breath with exertion. She took extra dose of lasix for past 1 week without significant symptomatic relief.   Pt was hemodynamically stable. Her blood work showed potassium of 5.2, Cr 2.15, troponin 0.04. The 12 lead EKG showed atrial fibrillation. She was started on IV lasix 60 mg IV Q 12 hours. Cardio consulted this am.   Assessment & Plan:   Principal Problem:   Lower extremity edema / Acute on chronic diastolic heart failure (HCC) - ECHO in 03/2015 showed grade 1 DD and preserved EF - ECHO pending this am - Started lasix 60 mg IV Q 12 hours but her BP on soft side so will have to hold it - Continue to monitor daily weight and strict intake and output  - Cardiology consulted, appreciate their input   Active Problems:   Left leg DVT  08-2014 - Decreased the dose to 10 mg a day by hematology and she was supposed to be on it through 08/2016.  - Xarelto on hold due to worsening renal function - Order placed for subQ heparin - Follow up on LE doppler this am    Essential hypertension - Lisinopril on hold due to worsening Cr - BP meds on hold due to soft BP    Hyperkalemia - Due to supplementation - It will likely correct with lasix     Elevated troponin - Likely demand ischemia due to CKD, acute CHF - Troponin 0.04, 0.05 and 0.04 - No chest pain - The 12 lead EKG showed a fib with RVR    GERD without  esophagitis - Continue protonix     Acute renal failure superimposed on stage 4 chronic kidney disease (HCC) - Cr baseline is 1/8 in 03/2015 and on this admission 2.15 - Monitor renal function since pt on IV lasix     Anemia of chronic kidney disease - Hgb drop from 12.1 to 10.6 - No evidence of gross bleed - Continue to monitor CBC daily - On Hep subQ, Xarelto on hold due ot renal insufficiency    DVT prophylaxis: Heparin subQ Code Status: full code  Family Communication: no family at the bedside this am Disposition Plan: not yet stable for discharge, needs to be seen by cardio for a fib and CHF   Consultants:   Cardiology   Procedures:   2 D ECHO pending   LE doppler  Antimicrobials:   None    Subjective: No overnight events.  Objective: Vitals:   06/24/16 0358 06/24/16 0859 06/24/16 0916 06/24/16 1022  BP: (!) 112/50   (!) 90/50  Pulse: (!) 131 (!) 125    Resp: 20     Temp: 97.6 F (36.4 C)     TempSrc: Oral     SpO2: 94% 96% 93%   Weight: 111.7 kg (246 lb 3.2 oz)     Height:        Intake/Output Summary (  Last 24 hours) at 06/24/16 1039 Last data filed at 06/24/16 0923  Gross per 24 hour  Intake              480 ml  Output             1100 ml  Net             -620 ml   Filed Weights   07/03/2016 1751 06/24/16 0358  Weight: 112 kg (247 lb) 111.7 kg (246 lb 3.2 oz)    Examination:  General exam: Appears calm and comfortable  Respiratory system: Clear to auscultation. Respiratory effort normal. Cardiovascular system: S1 & S2 heard, irregular rhythm, tachycardic  Gastrointestinal system: Abdomen is nondistended, soft and nontender. No organomegaly or masses felt. Normal bowel sounds heard. Central nervous system: Alert and oriented. No focal neurological deficits. Extremities: +3 LE edema bilaterally, palpable pulses  Skin: redness over bilateral lower extremities Psychiatry: Judgement and insight appear normal. Mood & affect appropriate.    Data Reviewed: I have personally reviewed following labs and imaging studies  CBC:  Recent Labs Lab 07/02/2016 1841 06/24/16 0904  WBC 7.1 6.6  HGB 12.1 10.6*  HCT 36.7 33.0*  MCV 88.9 89.7  PLT 230 [id number]   Basic Metabolic Panel:  Recent Labs Lab 06/25/2016 1841 06/24/16 0904  NA 136 136  K 5.2* 4.7  CL 101 103  CO2 29 28  GLUCOSE 141* 131*  BUN 43* 43*  CREATININE 2.15* 2.24*  CALCIUM 9.8 9.3  MG 2.3  --   PHOS 2.8  --    GFR: Estimated Creatinine Clearance: 20.1 mL/min (by C-G formula based on SCr of 2.24 mg/dL (H)). Liver Function Tests:  Recent Labs Lab 06/26/2016 1841  AST 26  ALT 22  ALKPHOS 83  BILITOT 0.7  PROT 6.3*  ALBUMIN 3.2*   No results for input(s): LIPASE, AMYLASE in the last 168 hours. No results for input(s): AMMONIA in the last 168 hours. Coagulation Profile:  Recent Labs Lab 07/10/2016 1841  INR 1.13   Cardiac Enzymes:  Recent Labs Lab 07/19/2016 1841 06/24/16 0005 06/24/16 0904  TROPONINI 0.04* 0.05* 0.04*   BNP (last 3 results) No results for input(s): PROBNP in the last 8760 hours. HbA1C: No results for input(s): HGBA1C in the last 72 hours. CBG:  Recent Labs Lab 07/08/2016 2135  GLUCAP 143*   Lipid Profile: No results for input(s): CHOL, HDL, LDLCALC, TRIG, CHOLHDL, LDLDIRECT in the last 72 hours. Thyroid Function Tests:  Recent Labs  06/25/2016 1842  TSH 1.984   Anemia Panel: No results for input(s): VITAMINB12, FOLATE, FERRITIN, TIBC, IRON, RETICCTPCT in the last 72 hours. Urine analysis: No results found for: COLORURINE, APPEARANCEUR, LABSPEC, PHURINE, GLUCOSEU, HGBUR, BILIRUBINUR, KETONESUR, PROTEINUR, UROBILINOGEN, NITRITE, LEUKOCYTESUR Sepsis Labs: @LABRCNTIP (procalcitonin:4,lacticidven:4)   )No results found for this or any previous visit (from the past 240 hour(s)).    Radiology Studies: No results found.   Scheduled Meds: . furosemide  60 mg Intravenous BID  . heparin subcutaneous  5,000 Units  Subcutaneous Q8H  . pantoprazole  40 mg Oral Daily  . sodium chloride flush  3 mL Intravenous Q12H   Continuous Infusions:   LOS: 1 day    Time spent: 25 minutes  Greater than 50% of the time spent on counseling and coordinating the care.   Leisa Lenz, MD [facility] Pager [phone]  If 7PM-7AM, please contact night-coverage [email or web address] Password [email or web address] 06/24/2016, 10:39 AM

## 2016-06-24 NOTE — Progress Notes (Addendum)
Patient up in the chair for at least half the 7 a to 7 p shift. Did walk to the door and around room as well.   BP's continue to be soft and both doses of Lasix were held on this shift. Notified MD of this.  Patient maintaining oxygen saturations in the 90's on 1 liter.

## 2016-06-25 ENCOUNTER — Inpatient Hospital Stay (HOSPITAL_COMMUNITY): Payer: Medicare Other

## 2016-06-25 DIAGNOSIS — I50810 Right heart failure, unspecified: Secondary | ICD-10-CM

## 2016-06-25 LAB — CBC
HEMATOCRIT: 34.2 % — AB (ref 36.0–46.0)
HEMOGLOBIN: 10.9 g/dL — AB (ref 12.0–15.0)
MCH: 28.9 pg (ref 26.0–34.0)
MCHC: 31.9 g/dL (ref 30.0–36.0)
MCV: 90.7 fL (ref 78.0–100.0)
Platelets: 222 10*3/uL (ref 150–400)
RBC: 3.77 MIL/uL — ABNORMAL LOW (ref 3.87–5.11)
RDW: 16.1 % — AB (ref 11.5–15.5)
WBC: 6.7 10*3/uL (ref 4.0–10.5)

## 2016-06-25 LAB — BASIC METABOLIC PANEL
ANION GAP: 6 (ref 5–15)
ANION GAP: 9 (ref 5–15)
BUN: 49 mg/dL — AB (ref 6–20)
BUN: 50 mg/dL — AB (ref 6–20)
CHLORIDE: 99 mmol/L — AB (ref 101–111)
CO2: 30 mmol/L (ref 22–32)
CO2: 29 mmol/L (ref 22–32)
Calcium: 9.4 mg/dL (ref 8.9–10.3)
Calcium: 9.5 mg/dL (ref 8.9–10.3)
Chloride: 101 mmol/L (ref 101–111)
Creatinine, Ser: 2.53 mg/dL — ABNORMAL HIGH (ref 0.44–1.00)
Creatinine, Ser: 2.54 mg/dL — ABNORMAL HIGH (ref 0.44–1.00)
GFR calc Af Amer: 18 mL/min — ABNORMAL LOW (ref >60)
GFR calc Af Amer: 18 mL/min — ABNORMAL LOW (ref >60)
GFR, EST NON AFRICAN AMERICAN: 16 mL/min — AB (ref >60)
GFR, EST NON AFRICAN AMERICAN: 16 mL/min — AB (ref >60)
GLUCOSE: 97 mg/dL (ref 65–99)
GLUCOSE: 111 mg/dL — AB (ref 65–99)
POTASSIUM: 5.4 mmol/L — AB (ref 3.5–5.1)
POTASSIUM: 4.8 mmol/L (ref 3.5–5.1)
Sodium: 137 mmol/L (ref 135–145)
Sodium: 137 mmol/L (ref 135–145)

## 2016-06-25 LAB — DIFFERENTIAL
Basophils Absolute: 0 10*3/uL (ref 0.0–0.1)
Basophils Relative: 1 %
Eosinophils Absolute: 0.3 10*3/uL (ref 0.0–0.7)
Eosinophils Relative: 4 %
Lymphocytes Relative: 22 %
Lymphs Abs: 1.6 10*3/uL (ref 0.7–4.0)
Monocytes Absolute: 1 10*3/uL (ref 0.1–1.0)
Monocytes Relative: 14 %
Neutro Abs: 4.3 10*3/uL (ref 1.7–7.7)
Neutrophils Relative %: 59 %

## 2016-06-25 LAB — GLUCOSE, CAPILLARY: GLUCOSE-CAPILLARY: 114 mg/dL — AB (ref 65–99)

## 2016-06-25 LAB — CORTISOL: Cortisol, Plasma: 18 ug/dL

## 2016-06-25 LAB — IRON AND TIBC
Iron: 48 ug/dL (ref 28–170)
SATURATION RATIOS: 13 % (ref 10.4–31.8)
TIBC: 364 ug/dL (ref 250–450)
UIBC: 316 ug/dL

## 2016-06-25 LAB — MRSA PCR SCREENING: MRSA by PCR: NEGATIVE

## 2016-06-25 LAB — POTASSIUM: Potassium: 5.5 mmol/L — ABNORMAL HIGH (ref 3.5–5.1)

## 2016-06-25 LAB — HEPARIN LEVEL (UNFRACTIONATED): Heparin Unfractionated: 0.64 IU/mL (ref 0.30–0.70)

## 2016-06-25 LAB — OCCULT BLOOD X 1 CARD TO LAB, STOOL: Fecal Occult Bld: NEGATIVE

## 2016-06-25 LAB — CREATININE, URINE, RANDOM: Creatinine, Urine: 79.64 mg/dL

## 2016-06-25 LAB — SODIUM, URINE, RANDOM: Sodium, Ur: 33 mmol/L

## 2016-06-25 MED ORDER — HEPARIN BOLUS VIA INFUSION
2000.0000 [IU] | Freq: Once | INTRAVENOUS | Status: AC
  Administered 2016-06-25: 2000 [IU] via INTRAVENOUS
  Filled 2016-06-25: qty 2000

## 2016-06-25 MED ORDER — AMIODARONE HCL IN DEXTROSE 360-4.14 MG/200ML-% IV SOLN
60.0000 mg/h | INTRAVENOUS | Status: AC
  Administered 2016-06-25: 60 mg/h via INTRAVENOUS
  Filled 2016-06-25: qty 200

## 2016-06-25 MED ORDER — SODIUM CHLORIDE 0.9 % IR SOLN
70.0000 mL | Status: DC
  Administered 2016-06-26 – 2016-07-03 (×9): 70 mL

## 2016-06-25 MED ORDER — AMIODARONE HCL IN DEXTROSE 360-4.14 MG/200ML-% IV SOLN
30.0000 mg/h | INTRAVENOUS | Status: DC
  Administered 2016-06-26 – 2016-07-03 (×16): 30 mg/h via INTRAVENOUS
  Filled 2016-06-25 (×18): qty 200

## 2016-06-25 MED ORDER — SODIUM POLYSTYRENE SULFONATE 15 GM/60ML PO SUSP
45.0000 g | Freq: Once | ORAL | Status: AC
  Administered 2016-06-25: 45 g via ORAL
  Filled 2016-06-25: qty 180

## 2016-06-25 MED ORDER — FUROSEMIDE 10 MG/ML IJ SOLN
60.0000 mg | Freq: Two times a day (BID) | INTRAMUSCULAR | Status: DC
  Administered 2016-06-25 – 2016-06-27 (×3): 60 mg via INTRAVENOUS
  Filled 2016-06-25 (×3): qty 6

## 2016-06-25 MED ORDER — HEPARIN (PORCINE) IN NACL 100-0.45 UNIT/ML-% IJ SOLN
1050.0000 [IU]/h | INTRAMUSCULAR | Status: DC
  Administered 2016-06-25: 1100 [IU]/h via INTRAVENOUS
  Administered 2016-06-27: 750 [IU]/h via INTRAVENOUS
  Administered 2016-06-29 – 2016-06-30 (×2): 1150 [IU]/h via INTRAVENOUS
  Filled 2016-06-25 (×5): qty 250

## 2016-06-25 MED ORDER — MIDODRINE HCL 5 MG PO TABS
5.0000 mg | ORAL_TABLET | Freq: Three times a day (TID) | ORAL | Status: DC
  Administered 2016-06-25 – 2016-07-03 (×24): 5 mg via ORAL
  Filled 2016-06-25 (×25): qty 1

## 2016-06-25 MED ORDER — DOCUSATE SODIUM 100 MG PO CAPS
100.0000 mg | ORAL_CAPSULE | Freq: Every day | ORAL | Status: DC
  Administered 2016-06-26 – 2016-06-28 (×3): 100 mg via ORAL
  Filled 2016-06-25 (×4): qty 1

## 2016-06-25 MED ORDER — ADULT MULTIVITAMIN W/MINERALS CH
1.0000 | ORAL_TABLET | Freq: Every day | ORAL | Status: DC
  Administered 2016-06-26 – 2016-06-30 (×5): 1 via ORAL
  Filled 2016-06-25 (×5): qty 1

## 2016-06-25 MED ORDER — HYDROCORTISONE 1 % EX CREA
1.0000 "application " | TOPICAL_CREAM | Freq: Every day | CUTANEOUS | Status: DC | PRN
  Filled 2016-06-25: qty 28

## 2016-06-25 MED ORDER — AMIODARONE LOAD VIA INFUSION
150.0000 mg | Freq: Once | INTRAVENOUS | Status: AC
  Administered 2016-06-25: 150 mg via INTRAVENOUS
  Filled 2016-06-25: qty 83.34

## 2016-06-25 NOTE — Progress Notes (Signed)
Spoke with [facility] cardiology regarding Lasix and patients BP.  BP remains soft 92/48 by RN manual, ok to give as long as SBP >90.  Lasix 60 mg IV given as ordered.

## 2016-06-25 NOTE — Progress Notes (Signed)
Amidarone drip started 1350, heart rate at this time down to 95-100.  Patient resting in bed with daughter in room.  RN has spoken to patient and daughter extensively about use of Amiodarone for rate control and being transferred to step down for closer monitoring.  Patient did have BMs x 3 after Kayexalate.

## 2016-06-25 NOTE — Progress Notes (Addendum)
Cade for heparin  Indication: atrial fibrillation  Allergies  Allergen Reactions  . Lithostat [Acetohydroxamic Acid] Other (See Comments)    Unusual tiredness, weakness, nervousness, mental confusion, sore throat, Poor appetite   Patient Measurements: Height: 5\' 2"  (157.5 cm) Weight: 244 lb 14.9 oz (111.1 kg) IBW/kg (Calculated) : 50.1 Heparin Dosing Weight: 77  Vital Signs: Temp: 98 F (36.7 C) (02/04 2016) Temp Source: Axillary (02/04 2016) BP: 92/59 (02/04 2100) Pulse Rate: 95 (02/04 2100)  Labs:  Recent Labs  07/17/2016 1841 06/24/16 0005 06/24/16 0904 06/25/16 0409 06/25/16 1256 06/25/16 2156  HGB 12.1  --  10.6* 10.9*  --   --   HCT 36.7  --  33.0* 34.2*  --   --   PLT 230  --  212 222  --   --   LABPROT 14.5  --   --   --   --   --   INR 1.13  --   --   --   --   --   HEPARINUNFRC  --   --   --   --   --  0.64  CREATININE 2.15*  --  2.24* 2.53* 2.54*  --   TROPONINI 0.04* 0.05* 0.04*  --   --   --     Estimated Creatinine Clearance: 17.7 mL/min (by C-G formula based on SCr of 2.54 mg/dL (H)).  Assessment: 83 y.o. female with Afib, Xarelto on hold, for heparin  Goal of Therapy:  Heparin level 0.3-0.7 units/ml Monitor platelets by anticoagulation protocol: Yes   Plan:  Continue Heparin at current rate  Follow-up am labs.  Phillis Knack, PharmD, BCPS  06/25/16 11:14 PM  Addendum: AM heparin level 0.97  H/H stable Decrease Heparin 900 units/hr Check heparin level in 8 hours.   Phillis Knack, PharmD, BCPS 06/26/2016 5:12 AM

## 2016-06-25 NOTE — Progress Notes (Signed)
Sent text to Dr.  Hollie Salk covering for nephrology regarding patients Lasix.  Dr. Salvadore Dom note says hold diuresis x 24 hours but IV Lasix is still ordered.  Text for clarification.

## 2016-06-25 NOTE — Progress Notes (Addendum)
DAILY PROGRESS NOTE  Subjective:  A-fib with RVR persists overnight. Diuretics held due to hypotension. LVEF normal on echo yesterday, so doubt this is a cardiorenal syndrome. Creatinine worse today (GFR <20) - little urine output.   Objective:  Temp:  [97.6 F (36.4 C)-98.1 F (36.7 C)] 98.1 F (36.7 C) (02/04 0400) Pulse Rate:  [71-93] 89 (02/04 0400) Resp:  [18-20] 18 (02/04 0400) BP: (87-115)/(47-82) 115/82 (02/04 0400) SpO2:  [93 %-97 %] 97 % (02/04 0400) Weight:  [244 lb 14.9 oz (111.1 kg)] 244 lb 14.9 oz (111.1 kg) (02/04 0400) Weight change: -2 lb 1.1 oz (-0.938 kg)  Intake/Output from previous day: 02/03 0701 - 02/04 0700 In: 960 [P.O.:960] Out: 150 [Urine:150]  Intake/Output from this shift: Total I/O In: 240 [P.O.:240] Out: 150 [Urine:150]  Medications: No current facility-administered medications on file prior to encounter.    Current Outpatient Prescriptions on File Prior to Encounter  Medication Sig Dispense Refill  . benazepril (LOTENSIN) 40 MG tablet Take 1 tablet (40 mg total) by mouth daily. 90 tablet 1  . Calcium Carbonate-Vitamin D (CALCIUM 600 + D PO) Take 1 tablet by mouth daily. \    . Docusate Calcium (STOOL SOFTENER PO) Take 1 capsule by mouth daily.     . furosemide (LASIX) 80 MG tablet Take 1 tablet (80 mg total) by mouth 2 (two) times daily. 60 tablet 11  . Methylcellulose, Laxative, (CITRUCEL PO) Take 2 tablets by mouth daily at 12 noon. Reported on 07/09/2015    . Omega-3 Fatty Acids (FISH OIL) 1200 MG CAPS Take 1,200 mg by mouth daily.     . potassium chloride SA (KLOR-CON M20) 20 MEQ tablet Take 1 tablet (20 mEq total) by mouth daily. 30 tablet 11  . traMADol (ULTRAM) 50 MG tablet Take 1 tablet (50 mg total) by mouth 3 (three) times daily as needed. (Patient taking differently: Take 50 mg by mouth 2 (two) times daily. ) 90 tablet 0  . XARELTO 20 MG TABS tablet TAKE 1 TABLET (20 MG TOTAL) BY MOUTH DAILY WITH SUPPER. (Patient taking  differently: TAKE 1/2 TABLET (10 MG) BY MOUTH DAILY WITH SUPPER (UNTIL OFFICE VISIT APRIL 2018)) 30 tablet 2  . augmented betamethasone dipropionate (DIPROLENE-AF) 0.05 % cream Apply topically 2 (two) times daily. (Patient not taking: Reported on 07/12/2016) 50 g 0  . lactulose (CHRONULAC) 10 GM/15ML solution Take 15 mLs (10 g total) by mouth daily as needed for mild constipation. (Patient not taking: Reported on 07/10/2016) 472 mL 0    Physical Exam: General appearance: alert and no distress Lungs: diminished breath sounds bibasilar Heart: irregularly irregular rhythm Extremities: edema 3+ edema Neurologic: Grossly normal  Lab Results: Results for orders placed or performed during the hospital encounter of 07/19/2016 (from the past 48 hour(s))  Urinalysis, Routine w reflex microscopic     Status: Abnormal   Collection Time: 06/27/2016  6:34 PM  Result Value Ref Range   Color, Urine YELLOW YELLOW   APPearance CLOUDY (A) CLEAR   Specific Gravity, Urine 1.013 1.005 - 1.030   pH 5.0 5.0 - 8.0   Glucose, UA NEGATIVE NEGATIVE mg/dL   Hgb urine dipstick SMALL (A) NEGATIVE   Bilirubin Urine NEGATIVE NEGATIVE   Ketones, ur NEGATIVE NEGATIVE mg/dL   Protein, ur NEGATIVE NEGATIVE mg/dL   Nitrite NEGATIVE NEGATIVE   Leukocytes, UA LARGE (A) NEGATIVE   RBC / HPF 6-30 0 - 5 RBC/hpf   WBC, UA TOO NUMEROUS TO COUNT 0 -  5 WBC/hpf   Bacteria, UA FEW (A) NONE SEEN   Squamous Epithelial / LPF 0-5 (A) NONE SEEN   WBC Clumps PRESENT    Mucous PRESENT    Hyaline Casts, UA PRESENT   Comprehensive metabolic panel     Status: Abnormal   Collection Time: 07/09/2016  6:41 PM  Result Value Ref Range   Sodium 136 135 - 145 mmol/L   Potassium 5.2 (H) 3.5 - 5.1 mmol/L   Chloride 101 101 - 111 mmol/L   CO2 29 22 - 32 mmol/L   Glucose, Bld 141 (H) 65 - 99 mg/dL   BUN 43 (H) 6 - 20 mg/dL   Creatinine, Ser 2.15 (H) 0.44 - 1.00 mg/dL   Calcium 9.8 8.9 - 10.3 mg/dL   Total Protein 6.3 (L) 6.5 - 8.1 g/dL   Albumin  3.2 (L) 3.5 - 5.0 g/dL   AST 26 15 - 41 U/L   ALT 22 14 - 54 U/L   Alkaline Phosphatase 83 38 - 126 U/L   Total Bilirubin 0.7 0.3 - 1.2 mg/dL   GFR calc non Af Amer 19 (L) >60 mL/min   GFR calc Af Amer 22 (L) >60 mL/min    Comment: (NOTE) The eGFR has been calculated using the CKD EPI equation. This calculation has not been validated in all clinical situations. eGFR's persistently <60 mL/min signify possible Chronic Kidney Disease.    Anion gap 6 5 - 15  Magnesium     Status: None   Collection Time: 06/27/2016  6:41 PM  Result Value Ref Range   Magnesium 2.3 1.7 - 2.4 mg/dL  Phosphorus     Status: None   Collection Time: 07/18/2016  6:41 PM  Result Value Ref Range   Phosphorus 2.8 2.5 - 4.6 mg/dL  CBC     Status: None   Collection Time: 06/22/2016  6:41 PM  Result Value Ref Range   WBC 7.1 4.0 - 10.5 K/uL   RBC 4.13 3.87 - 5.11 MIL/uL   Hemoglobin 12.1 12.0 - 15.0 g/dL   HCT 36.7 36.0 - 46.0 %   MCV 88.9 78.0 - 100.0 fL   MCH 29.3 26.0 - 34.0 pg   MCHC 33.0 30.0 - 36.0 g/dL   RDW 15.5 11.5 - 15.5 %   Platelets 230 150 - 400 K/uL  Protime-INR     Status: None   Collection Time: 07/14/2016  6:41 PM  Result Value Ref Range   Prothrombin Time 14.5 11.4 - 15.2 seconds   INR 1.13   Troponin I     Status: Abnormal   Collection Time: 07/02/2016  6:41 PM  Result Value Ref Range   Troponin I 0.04 (HH) <0.03 ng/mL    Comment: CRITICAL RESULT CALLED TO, READ BACK BY AND VERIFIED WITH: Starlyn Skeans RN [phone] 1939 GREEN R   TSH     Status: None   Collection Time: 07/13/2016  6:42 PM  Result Value Ref Range   TSH 1.984 0.350 - 4.500 uIU/mL    Comment: Performed by a 3rd Generation assay with a functional sensitivity of <=0.01 uIU/mL.  Glucose, capillary     Status: Abnormal   Collection Time: 06/22/2016  9:35 PM  Result Value Ref Range   Glucose-Capillary 143 (H) 65 - 99 mg/dL   Comment 1 Notify RN    Comment 2 Document in Chart   Troponin I     Status: Abnormal   Collection Time: 06/24/16  12:05 AM  Result Value Ref  Range   Troponin I 0.05 (HH) <0.03 ng/mL    Comment: CRITICAL VALUE NOTED.  VALUE IS CONSISTENT WITH PREVIOUSLY REPORTED AND CALLED VALUE.  Troponin I     Status: Abnormal   Collection Time: 06/24/16  9:04 AM  Result Value Ref Range   Troponin I 0.04 (HH) <0.03 ng/mL    Comment: CRITICAL VALUE NOTED.  VALUE IS CONSISTENT WITH PREVIOUSLY REPORTED AND CALLED VALUE.  CBC     Status: Abnormal   Collection Time: 06/24/16  9:04 AM  Result Value Ref Range   WBC 6.6 4.0 - 10.5 K/uL   RBC 3.68 (L) 3.87 - 5.11 MIL/uL   Hemoglobin 10.6 (L) 12.0 - 15.0 g/dL   HCT 33.0 (L) 36.0 - 46.0 %   MCV 89.7 78.0 - 100.0 fL   MCH 28.8 26.0 - 34.0 pg   MCHC 32.1 30.0 - 36.0 g/dL   RDW 15.7 (H) 11.5 - 15.5 %   Platelets 212 150 - 400 K/uL  Basic metabolic panel     Status: Abnormal   Collection Time: 06/24/16  9:04 AM  Result Value Ref Range   Sodium 136 135 - 145 mmol/L   Potassium 4.7 3.5 - 5.1 mmol/L   Chloride 103 101 - 111 mmol/L   CO2 28 22 - 32 mmol/L   Glucose, Bld 131 (H) 65 - 99 mg/dL   BUN 43 (H) 6 - 20 mg/dL   Creatinine, Ser 2.24 (H) 0.44 - 1.00 mg/dL   Calcium 9.3 8.9 - 10.3 mg/dL   GFR calc non Af Amer 18 (L) >60 mL/min   GFR calc Af Amer 21 (L) >60 mL/min    Comment: (NOTE) The eGFR has been calculated using the CKD EPI equation. This calculation has not been validated in all clinical situations. eGFR's persistently <60 mL/min signify possible Chronic Kidney Disease.    Anion gap 5 5 - 15  Basic metabolic panel     Status: Abnormal   Collection Time: 06/25/16  4:09 AM  Result Value Ref Range   Sodium 137 135 - 145 mmol/L   Potassium 5.4 (H) 3.5 - 5.1 mmol/L   Chloride 101 101 - 111 mmol/L   CO2 30 22 - 32 mmol/L   Glucose, Bld 97 65 - 99 mg/dL   BUN 49 (H) 6 - 20 mg/dL   Creatinine, Ser 2.53 (H) 0.44 - 1.00 mg/dL   Calcium 9.4 8.9 - 10.3 mg/dL   GFR calc non Af Amer 16 (L) >60 mL/min   GFR calc Af Amer 18 (L) >60 mL/min    Comment: (NOTE) The  eGFR has been calculated using the CKD EPI equation. This calculation has not been validated in all clinical situations. eGFR's persistently <60 mL/min signify possible Chronic Kidney Disease.    Anion gap 6 5 - 15  CBC     Status: Abnormal   Collection Time: 06/25/16  4:09 AM  Result Value Ref Range   WBC 6.7 4.0 - 10.5 K/uL   RBC 3.77 (L) 3.87 - 5.11 MIL/uL   Hemoglobin 10.9 (L) 12.0 - 15.0 g/dL   HCT 34.2 (L) 36.0 - 46.0 %   MCV 90.7 78.0 - 100.0 fL   MCH 28.9 26.0 - 34.0 pg   MCHC 31.9 30.0 - 36.0 g/dL   RDW 16.1 (H) 11.5 - 15.5 %   Platelets 222 150 - 400 K/uL  Potassium     Status: Abnormal   Collection Time: 06/25/16  8:08 AM  Result Value Ref  Range   Potassium 5.5 (H) 3.5 - 5.1 mmol/L    Imaging: US Renal  Result Date: 06/25/2016 CLINICAL DATA:  Acute renal failure. EXAM: RENAL / URINARY TRACT ULTRASOUND COMPLETE COMPARISON:  None. FINDINGS: Right Kidney: Length: 8.6 cm. Echogenicity within normal limits. No mass or hydronephrosis visualized. Left Kidney: Length: 10.8 cm. Echogenicity within normal limits. No mass or hydronephrosis visualized. Bladder: Decompressed and therefore not well visualized. IMPRESSION: Mild right renal atrophy. No hydronephrosis or renal obstruction is noted. Electronically Signed   By: Marijo Conception, M.D.   On: 06/25/2016 08:57    Assessment:  1. Principal Problem: 2.   Lower extremity edema 3. Active Problems: 4.   Left leg DVT  08-2014 5.   Atrial fibrillation ([place]) 6.   Hyperkalemia 7.   Elevated troponin 8.   GERD without esophagitis 9.   Acute renal failure superimposed on stage 4 chronic kidney disease ([place]) 10.   Acute on chronic diastolic heart failure ([place]) 11.   Essential hypertension 12.   Plan:   1. Acute primarily right heart failure with normal LV function. Worsening renal failure. Nephrology consulted, however, suspect she will not be a dialysis candidate. Will start amiodarone. May consider low dose milrinone if we can  rate-control her and improve BP. Needs volume removal. Start IV heparin - may need TEE/Cardioversion for rate-control.  Time Spent Directly with Patient:  15 minutes  Length of Stay:  LOS: 2 days   Pixie Casino, MD, [facility] Attending Cardiologist [place] 06/25/2016, 11:30 AM

## 2016-06-25 NOTE — Assessment & Plan Note (Signed)
New onset atrial fibrillation with worsening CHF. Patient presents with two-week history of increased edema, on exam her heart was  irregularly irregular , EKG consistent with A. fib (discussed with cardiology DOD). Given age, comorbidities, CHF symptoms she needs to be admitted. Situation discussed with the patient and her daughter, multiple questions answered to the best of my ability. Contacted the hospital service, spoke with Dr. Aggie Moats, whom accept the patient to [place]- our EKG machine was down, we used a different MACHINE,  EKG DID NOT FLOW in the system but was sent to be scan. We gave the patient copies of the EKG today.

## 2016-06-25 NOTE — Consult Note (Signed)
Reason for Consult:AKI, CKD 3, edema Referring Physician: Dr. Sampson Goon is an 83 y.o. female.  HPI: 83 yr female with extensive PMH including neurogenic bladder secondary to back surgery, bilat TKR, HTN x54yr pre DM, AFIB, Hx uterine Ca, GERD on PPI., obesity .  Has suffered chronic LE edema, and recently past few wk worse, with dyspnea on exertion, wgt ^.  Admitted with this .  Baseline Cr is 1.6-1.8.  Admit Cr 2.1 on 2/2 and now 2.5.  bp has been low with sys in 80-100 range. Has afib also.  No fevers, chills, no hx UTIs, no rash, NSAIDs, but was on ACEI and K at home. No FH renal dz. Has had bladder stones.   Constitutional: as above, weak , SOB Eyes: wears glasses Ears, nose, mouth, throat, and face: negative Respiratory: used to smoke,no cough Cardiovascular: as above Gastrointestinal: negative Genitourinary:has  plugged SP cath Integument/breast: hands reddened Musculoskeletal:arth of knees, back, hands Neurological: negative Endocrine: negative Allergic/Immunologic: lithostat   Past Medical History:  Diagnosis Date  . ATONIC BLADDER-- has a permananet suprapubic catheter    . Calculus in urethra    Dr. DDiona Fanti . Diastolic dysfunction, left ventricle 05/30/2013   05/2013 see ECHO   . DJD (degenerative joint disease)   . GERD (gastroesophageal reflux disease)   . HTN (hypertension) 12/30/2007  . Nonspecific elevation of levels of transaminase or lactic acid dehydrogenase (LDH) 1996   + Hepatitis srology  . Prediabetes 04/07/2014  . Uterine cancer  (h/O)    no recent f/u     Past Surgical History:  Procedure Laterality Date  . APPENDECTOMY    . cataract surgery  2014   bilateral; Dr LPrudencio Burly . COLONOSCOPY W/ POLYPECTOMY  1998   Tics in 2006; Dr PSharlett Iles . CYSTOSCOPY  02/28/2016   3-4 stones approximately 1cm located in posterior bladder, no tumors seen  . ESOPHAGEAL DILATION  2006  . POSTERIOR LAMINECTOMY / DECOMPRESSION LUMBAR SPINE  2009   Dr YLorin Mercy  . SBO  1991   adhesions resected  . TOTAL ABDOMINAL HYSTERECTOMY W/ BILATERAL SALPINGOOPHORECTOMY  1990   uterine cancer  . TOTAL KNEE ARTHROPLASTY  1996 & 2002    Family History  Problem Relation Age of Onset  . Colon cancer Mother   . Diabetes Mother     mother, brother  . Stroke Mother     > 696 . Liver disease Sister     non hepatitis  . Heart failure Sister   . CAD Brother     brother, mother , sister  . Breast cancer Neg Hx     Social History:  reports that she quit smoking about 30 years ago. She has never used smokeless tobacco. She reports that she does not drink alcohol or use drugs.  Allergies:  Allergies  Allergen Reactions  . Lithostat [Acetohydroxamic Acid] Other (See Comments)    Unusual tiredness, weakness, nervousness, mental confusion, sore throat, Poor appetite    Medications:  I have reviewed the patient's current medications. Prior to Admission:  Prescriptions Prior to Admission  Medication Sig Dispense Refill Last Dose  . acetaminophen (TYLENOL) 500 MG tablet Take 1,000 mg by mouth every 6 (six) hours as needed (pain).   06/30/2016 at am  . benazepril (LOTENSIN) 40 MG tablet Take 1 tablet (40 mg total) by mouth daily. 90 tablet 1 07/09/2016 at Unknown time  . Calcium Carbonate-Vitamin D (CALCIUM 600 + D PO) Take 1 tablet  by mouth daily. \   06/27/2016 at Unknown time  . Docusate Calcium (STOOL SOFTENER PO) Take 1 capsule by mouth daily.    07/08/2016 at Unknown time  . furosemide (LASIX) 80 MG tablet Take 1 tablet (80 mg total) by mouth 2 (two) times daily. 60 tablet 11 07/11/2016 at am  . hydrocortisone cream 1 % Apply 1 application topically daily as needed for itching (redness/ irritation).   week ago  . Methylcellulose, Laxative, (CITRUCEL PO) Take 2 tablets by mouth daily at 12 noon. Reported on 07/09/2015   06/22/2016 at Unknown time  . Multiple Vitamin (MULTIVITAMIN WITH MINERALS) TABS tablet Take 1 tablet by mouth daily.   07/01/2016 at Unknown time  .  Omega-3 Fatty Acids (FISH OIL) 1200 MG CAPS Take 1,200 mg by mouth daily.    07/06/2016 at Unknown time  . omeprazole (PRILOSEC OTC) 20 MG tablet Take 20 mg by mouth 4 (four) times a week. Sunday, Tuesday, Thursday, Saturday   06/22/2016 at Unknown time  . potassium chloride SA (KLOR-CON M20) 20 MEQ tablet Take 1 tablet (20 mEq total) by mouth daily. 30 tablet 11 07/12/2016 at Unknown time  . sodium chloride irrigation 0.9 % irrigation 70 mLs See admin instructions. Use 70 ml to flush catheter daily  5 07/07/2016 at Unknown time  . traMADol (ULTRAM) 50 MG tablet Take 1 tablet (50 mg total) by mouth 3 (three) times daily as needed. (Patient taking differently: Take 50 mg by mouth 2 (two) times daily. ) 90 tablet 0 06/25/2016 at am  . VITAMIN E PO Take 1 capsule by mouth daily.   06/26/2016 at Unknown time  . XARELTO 20 MG TABS tablet TAKE 1 TABLET (20 MG TOTAL) BY MOUTH DAILY WITH SUPPER. (Patient taking differently: TAKE 1/2 TABLET (10 MG) BY MOUTH DAILY WITH SUPPER (UNTIL OFFICE VISIT APRIL 2018)) 30 tablet 2 06/22/2016 at Unknown time  . augmented betamethasone dipropionate (DIPROLENE-AF) 0.05 % cream Apply topically 2 (two) times daily. (Patient not taking: Reported on 07/16/2016) 50 g 0 Not Taking at Unknown time  . lactulose (CHRONULAC) 10 GM/15ML solution Take 15 mLs (10 g total) by mouth daily as needed for mild constipation. (Patient not taking: Reported on 07/05/2016) 472 mL 0 Not Taking at Unknown time    Results for orders placed or performed during the hospital encounter of 06/22/2016 (from the past 48 hour(s))  Urinalysis, Routine w reflex microscopic     Status: Abnormal   Collection Time: 06/22/2016  6:34 PM  Result Value Ref Range   Color, Urine YELLOW YELLOW   APPearance CLOUDY (A) CLEAR   Specific Gravity, Urine 1.013 1.005 - 1.030   pH 5.0 5.0 - 8.0   Glucose, UA NEGATIVE NEGATIVE mg/dL   Hgb urine dipstick SMALL (A) NEGATIVE   Bilirubin Urine NEGATIVE NEGATIVE   Ketones, ur NEGATIVE NEGATIVE  mg/dL   Protein, ur NEGATIVE NEGATIVE mg/dL   Nitrite NEGATIVE NEGATIVE   Leukocytes, UA LARGE (A) NEGATIVE   RBC / HPF 6-30 0 - 5 RBC/hpf   WBC, UA TOO NUMEROUS TO COUNT 0 - 5 WBC/hpf   Bacteria, UA FEW (A) NONE SEEN   Squamous Epithelial / LPF 0-5 (A) NONE SEEN   WBC Clumps PRESENT    Mucous PRESENT    Hyaline Casts, UA PRESENT   Comprehensive metabolic panel     Status: Abnormal   Collection Time: 07/17/2016  6:41 PM  Result Value Ref Range   Sodium 136 135 - 145 mmol/L  Potassium 5.2 (H) 3.5 - 5.1 mmol/L   Chloride 101 101 - 111 mmol/L   CO2 29 22 - 32 mmol/L   Glucose, Bld 141 (H) 65 - 99 mg/dL   BUN 43 (H) 6 - 20 mg/dL   Creatinine, Ser 2.15 (H) 0.44 - 1.00 mg/dL   Calcium 9.8 8.9 - 10.3 mg/dL   Total Protein 6.3 (L) 6.5 - 8.1 g/dL   Albumin 3.2 (L) 3.5 - 5.0 g/dL   AST 26 15 - 41 U/L   ALT 22 14 - 54 U/L   Alkaline Phosphatase 83 38 - 126 U/L   Total Bilirubin 0.7 0.3 - 1.2 mg/dL   GFR calc non Af Amer 19 (L) >60 mL/min   GFR calc Af Amer 22 (L) >60 mL/min    Comment: (NOTE) The eGFR has been calculated using the CKD EPI equation. This calculation has not been validated in all clinical situations. eGFR's persistently <60 mL/min signify possible Chronic Kidney Disease.    Anion gap 6 5 - 15  Magnesium     Status: None   Collection Time: 07/14/2016  6:41 PM  Result Value Ref Range   Magnesium 2.3 1.7 - 2.4 mg/dL  Phosphorus     Status: None   Collection Time: 06/24/2016  6:41 PM  Result Value Ref Range   Phosphorus 2.8 2.5 - 4.6 mg/dL  CBC     Status: None   Collection Time: 07/07/2016  6:41 PM  Result Value Ref Range   WBC 7.1 4.0 - 10.5 K/uL   RBC 4.13 3.87 - 5.11 MIL/uL   Hemoglobin 12.1 12.0 - 15.0 g/dL   HCT 36.7 36.0 - 46.0 %   MCV 88.9 78.0 - 100.0 fL   MCH 29.3 26.0 - 34.0 pg   MCHC 33.0 30.0 - 36.0 g/dL   RDW 15.5 11.5 - 15.5 %   Platelets 230 150 - 400 K/uL  Protime-INR     Status: None   Collection Time: 07/01/2016  6:41 PM  Result Value Ref Range    Prothrombin Time 14.5 11.4 - 15.2 seconds   INR 1.13   Troponin I     Status: Abnormal   Collection Time: 07/06/2016  6:41 PM  Result Value Ref Range   Troponin I 0.04 (HH) <0.03 ng/mL    Comment: CRITICAL RESULT CALLED TO, READ BACK BY AND VERIFIED WITH: Starlyn Skeans RN [phone] GREEN R   TSH     Status: None   Collection Time: 07/14/2016  6:42 PM  Result Value Ref Range   TSH 1.984 0.350 - 4.500 uIU/mL    Comment: Performed by a 3rd Generation assay with a functional sensitivity of <=0.01 uIU/mL.  Glucose, capillary     Status: Abnormal   Collection Time: 07/09/2016  9:35 PM  Result Value Ref Range   Glucose-Capillary 143 (H) 65 - 99 mg/dL   Comment 1 Notify RN    Comment 2 Document in Chart   Troponin I     Status: Abnormal   Collection Time: 06/24/16 12:05 AM  Result Value Ref Range   Troponin I 0.05 (HH) <0.03 ng/mL    Comment: CRITICAL VALUE NOTED.  VALUE IS CONSISTENT WITH PREVIOUSLY REPORTED AND CALLED VALUE.  Troponin I     Status: Abnormal   Collection Time: 06/24/16  9:04 AM  Result Value Ref Range   Troponin I 0.04 (HH) <0.03 ng/mL    Comment: CRITICAL VALUE NOTED.  VALUE IS CONSISTENT WITH PREVIOUSLY REPORTED AND CALLED  VALUE.  CBC     Status: Abnormal   Collection Time: 06/24/16  9:04 AM  Result Value Ref Range   WBC 6.6 4.0 - 10.5 K/uL   RBC 3.68 (L) 3.87 - 5.11 MIL/uL   Hemoglobin 10.6 (L) 12.0 - 15.0 g/dL   HCT 33.0 (L) 36.0 - 46.0 %   MCV 89.7 78.0 - 100.0 fL   MCH 28.8 26.0 - 34.0 pg   MCHC 32.1 30.0 - 36.0 g/dL   RDW 15.7 (H) 11.5 - 15.5 %   Platelets 212 150 - 400 K/uL  Basic metabolic panel     Status: Abnormal   Collection Time: 06/24/16  9:04 AM  Result Value Ref Range   Sodium 136 135 - 145 mmol/L   Potassium 4.7 3.5 - 5.1 mmol/L   Chloride 103 101 - 111 mmol/L   CO2 28 22 - 32 mmol/L   Glucose, Bld 131 (H) 65 - 99 mg/dL   BUN 43 (H) 6 - 20 mg/dL   Creatinine, Ser 2.24 (H) 0.44 - 1.00 mg/dL   Calcium 9.3 8.9 - 10.3 mg/dL   GFR calc non Af Amer 18  (L) >60 mL/min   GFR calc Af Amer 21 (L) >60 mL/min    Comment: (NOTE) The eGFR has been calculated using the CKD EPI equation. This calculation has not been validated in all clinical situations. eGFR's persistently <60 mL/min signify possible Chronic Kidney Disease.    Anion gap 5 5 - 15  Basic metabolic panel     Status: Abnormal   Collection Time: 06/25/16  4:09 AM  Result Value Ref Range   Sodium 137 135 - 145 mmol/L   Potassium 5.4 (H) 3.5 - 5.1 mmol/L   Chloride 101 101 - 111 mmol/L   CO2 30 22 - 32 mmol/L   Glucose, Bld 97 65 - 99 mg/dL   BUN 49 (H) 6 - 20 mg/dL   Creatinine, Ser 2.53 (H) 0.44 - 1.00 mg/dL   Calcium 9.4 8.9 - 10.3 mg/dL   GFR calc non Af Amer 16 (L) >60 mL/min   GFR calc Af Amer 18 (L) >60 mL/min    Comment: (NOTE) The eGFR has been calculated using the CKD EPI equation. This calculation has not been validated in all clinical situations. eGFR's persistently <60 mL/min signify possible Chronic Kidney Disease.    Anion gap 6 5 - 15  CBC     Status: Abnormal   Collection Time: 06/25/16  4:09 AM  Result Value Ref Range   WBC 6.7 4.0 - 10.5 K/uL   RBC 3.77 (L) 3.87 - 5.11 MIL/uL   Hemoglobin 10.9 (L) 12.0 - 15.0 g/dL   HCT 34.2 (L) 36.0 - 46.0 %   MCV 90.7 78.0 - 100.0 fL   MCH 28.9 26.0 - 34.0 pg   MCHC 31.9 30.0 - 36.0 g/dL   RDW 16.1 (H) 11.5 - 15.5 %   Platelets 222 150 - 400 K/uL  Potassium     Status: Abnormal   Collection Time: 06/25/16  8:08 AM  Result Value Ref Range   Potassium 5.5 (H) 3.5 - 5.1 mmol/L    US Renal  Result Date: 06/25/2016 CLINICAL DATA:  Acute renal failure. EXAM: RENAL / URINARY TRACT ULTRASOUND COMPLETE COMPARISON:  None. FINDINGS: Right Kidney: Length: 8.6 cm. Echogenicity within normal limits. No mass or hydronephrosis visualized. Left Kidney: Length: 10.8 cm. Echogenicity within normal limits. No mass or hydronephrosis visualized. Bladder: Decompressed and therefore not well visualized. IMPRESSION:  Mild right renal  atrophy. No hydronephrosis or renal obstruction is noted. Electronically Signed   By: Marijo Conception, M.D.   On: 06/25/2016 08:57    ROS Blood pressure 115/82, pulse 89, temperature 98.1 F (36.7 C), temperature source Oral, resp. rate 18, height _0  (1.575 m), weight 111.1 kg (244 lb 14.9 oz), SpO2 97 %. Physical Exam Physical Examination: General appearance - overweight and alert, NAD Mental status - alert, oriented to person, place, and time Eyes - pupils equal and reactive, extraocular eye movements intact, funduscopic exam normal, discs flat and sharp Mouth - few lower teeth, no upper Neck - adenopathy noted PCL Lymphatics - posterior cervical nodes Chest - rales noted bibasilar, decreased bs Heart - irregularly irregular rhythm with rate 96, systolic murmur QM2/1 at apex Abdomen - obese, pos bs, soft, nontender Musculoskeletal - severe hypertrophic changes DIPs, PIPs, MCPs, with deformities, TKR with enlargement Extremities - pedal edema 3 +, intact peripheral pulses, erythema and stasis changes going up to knees Skin - pale, doughy, changes LE, bruises  Assessment/Plan: 1 AKI with vol xs.  Suspect low perfusion in setting of ACEI .  Need to consider AIN, infectious and noninfectious.  Will hold off diuresis 24h,allow to reequilibrate and start low dose Lasix.   2 CKD4 suspect chronic obstruction with reflux 3 Hypotension  Will check culture, check cortisol.  Suspect RHF and afib primary causes 4. Edema  ?? RHF, hx DVT but doubt that is issue 5. Metabolic Bone Disease: check PTH 6 Anemia check Fe P hold off diuretics, start mido, urine eos, culture, SP to drain, diff, Fe check , PTH.  UNa and Cr.  [place] L 06/25/2016, 12:54 PM

## 2016-06-25 NOTE — Progress Notes (Signed)
Report called to nurse on 4N.  Patient stable at time of transport. Daughter accompanying patient.

## 2016-06-25 NOTE — Progress Notes (Addendum)
ANTICOAGULATION CONSULT NOTE - Initial Consult  Pharmacy Consult for heparin  Indication: atrial fibrillation  Allergies  Allergen Reactions  . Lithostat [Acetohydroxamic Acid] Other (See Comments)    Unusual tiredness, weakness, nervousness, mental confusion, sore throat, Poor appetite   Patient Measurements: Height: 5\' 2"  (157.5 cm) Weight: 244 lb 14.9 oz (111.1 kg) IBW/kg (Calculated) : 50.1 Heparin Dosing Weight: 77  Vital Signs: Temp: 98.1 F (36.7 C) (02/04 0400) Temp Source: Oral (02/04 0400) BP: 115/82 (02/04 0400) Pulse Rate: 89 (02/04 0400)  Labs:  Recent Labs  06/29/2016 1841 06/24/16 0005 06/24/16 0904 06/25/16 0409  HGB 12.1  --  10.6* 10.9*  HCT 36.7  --  33.0* 34.2*  PLT 230  --  212 222  LABPROT 14.5  --   --   --   INR 1.13  --   --   --   CREATININE 2.15*  --  2.24* 2.53*  TROPONINI 0.04* 0.05* 0.04*  --     Estimated Creatinine Clearance: 17.7 mL/min (by C-G formula based on SCr of 2.53 mg/dL (H)).   Medical History: Past Medical History:  Diagnosis Date  . ATONIC BLADDER-- has a permananet suprapubic catheter    . Calculus in urethra    Dr. Diona Fanti  . Diastolic dysfunction, left ventricle 05/30/2013   05/2013 see ECHO   . DJD (degenerative joint disease)   . GERD (gastroesophageal reflux disease)   . HTN (hypertension) 12/30/2007  . Nonspecific elevation of levels of transaminase or lactic acid dehydrogenase (LDH) 1996   + Hepatitis srology  . Prediabetes 04/07/2014  . Uterine cancer  (h/O)    no recent f/u    Assessment: 83 yo female admitted with weakness and leg swelling. History of DVT on Xarelto PTA through 08/2016 per notes. Xarelto held at this time, last dose 2/1. Expect Xarelto to be washed out of system as 1/2 life at most ~13 hrs. Patient now with new onset Afib and pharmacy to dose heparin. CBC low, but stable. No overt s/s bleeding noted. Last dose of SQ heparin 0530 2/4. Noted renal dysfunction - will give conservative bolus  dose.   Goal of Therapy:  Heparin level 0.3-0.7 units/ml Monitor platelets by anticoagulation protocol: Yes   Plan:  Heparin 2000 units x1 Start heparin gtt at 1100 units/hr Heparin level in 8 hours Daily heparin level and CBC Monitor for s/s bleeding   Argie Ramming, PharmD Pharmacy Resident  Pager [phone] 06/25/16 11:56 AM

## 2016-06-25 NOTE — Progress Notes (Signed)
Transferred in from 3 east by bed awake and alert. 

## 2016-06-25 NOTE — Progress Notes (Signed)
PROGRESS NOTE    Christina Martin  [id number] DOB: Feb 17, 1925 DOA: 07/11/2016 PCP: [name], MD    Brief Narrative:  83 y.o.femalewith medical history significant for chronic diastolic CHF (last 2 D ECHO in 03/2015 showed EF 65% and grade 1 DD), CKD stage 4 with baseline creatinine 1.80 back in 03/2015, hypertension, GERD. She presented to [facility] with worsening lower extremity swelling and difficulty ambulating over past 2 weeks prior to this admission. She also had shortness of breath with exertion. She took extra dose of lasix for past 1 week without significant symptomatic relief.   Pt was hemodynamically stable. Her blood work showed potassium of 5.2, Cr 2.15, troponin 0.04. The 12 lead EKG showed atrial fibrillation. She was started on IV lasix 60 mg IV Q 12 hours. Cardio consulted this am.    Assessment & Plan:   Principal Problem:   Lower extremity edema Active Problems:   Left leg DVT  08-2014   Atrial fibrillation (HCC)   Hyperkalemia   Elevated troponin   GERD without esophagitis   Acute renal failure superimposed on stage 4 chronic kidney disease (HCC)   Acute on chronic diastolic heart failure (HCC)   Essential hypertension  Lower extremity edema /Acute on chronic diastolic heart failure ([place]) - ECHO in 03/2015 showed grade 1 DD and preserved EF - ECHO with a EF of 60-65%, no wall motion abnormalities, moderate left atrial enlargement and severe RAE, moderate TR IVC dilated. - Started lasix 60 mg IV Q 12 hours but her BP on soft side and a such diuretics have been held since admission.  - Cardiology has been consulted and due to soft blood pressure patient may be placed on pressors in order to adequately diuresed.    New onset atrial fibrillation ?? Etiology. Patient with heart rates in the 120s. Blood pressure borderline with systolic blood pressure in the 90s. Cardiac enzymes minimally elevated and seemed to have plateaued. 2-D echo with a EF of  60-65% with no wall motion abnormalities. Moderate left atrial enlargement. Severe RAE. Moderate TR. IVC dilated. Due to soft blood pressure patient to be started on IV amiodarone per cardiology. Xarelto held on admission secondary to worsening renal function. Patient with no overt bleeding. FOBT pending. Patient to be started on IV amiodarone. Per cardiology.     Active Problems: Hx of Left leg DVT 08-2014 - Decreased the dose to 10 mg a day by hematology and she was supposed to be on it through 08/2016.  - Xarelto on hold due to worsening renal function - Order placed for subQ heparin - lower extremity Dopplers negative for DVT. - Patient to be started on IV heparin per cardiology today.  Essential hypertension - Lisinopril on hold due to worsening Cr - BP meds on hold due to soft BP - Follow.  Hyperkalemia - Due to supplementation and worsening renal function. - Patient unable to receive lasix secondary to soft BP. - Will give a dose of Kayexalate. Follow BMET.   Elevated troponin - Likely demand ischemia due to CKD, acute CHF - Troponin 0.04, 0.05 and 0.04 - No chest pain - The 12 lead EKG showed a fib with RVR  GERD without esophagitis - Continue protonix   Acute renal failure superimposed on stage 4 chronic kidney disease (HCC) - Cr baseline is 1.6 on 02/2016 and on this admission 2.15 which has worsened to 2.53 on 06/25/2016. - Patient unable to receive any IV Lasix secondary to soft blood  pressure. Patient noted to be volume overloaded on examination. -Urinalysis was negative for proteins on 06/22/2016. Repeat UA with cultures and sensitivities. Check urine sodium, check a urine creatinine. - Renal ultrasound with mild right renal atrophy. No hydronephrosis or renal obstruction noted. - Patient was on benazepril prior to admission however this was discontinued on admission. -  consulted nephrology for further evaluation and management.    Anemia of  chronic kidney disease - Hgb drop from 12.1 to 10.6 - No evidence of gross bleed - FOBT pending. - Likely anemia of chronic disease. - Continue to monitor CBC daily - On Hep subQ, Xarelto on hold due ot renal insufficiency. -    DVT prophylaxis: Heparin Code Status: Full Family Communication: Updated patient. No family at bedside. Disposition Plan: Home versus SNF when medically stable volume overload has resolved no longer in atrial fibrillation and acute on chronic renal failure has resolved.   Consultants:   Cardiology: Dr Debara Pickett 06/24/2016 nephrology pending  Procedures:   ReWhich henal ultrasound 06/25/2016  Lower extremity Dopplers 06/24/2016  2-D echo 06/24/2016   Antimicrobials:   None   Subjective: Sleeping however easily arousable. Patient denies any chest pain. No shortness of breath. Patient states feels better than on admission. Patient with borderline blood pressure yesterday and per nursing did not receive any IV diuretics.  Objective: Vitals:   06/24/16 1202 06/24/16 1739 06/24/16 2150 06/25/16 0400  BP: (!) 87/66 (!) 101/47 (!) 96/48 115/82  Pulse: 71  93 89  Resp: 20  18 18   Temp: 97.6 F (36.4 C)  97.9 F (36.6 C) 98.1 F (36.7 C)  TempSrc: Oral  Oral Oral  SpO2: 93% 97% 95% 97%  Weight:    111.1 kg (244 lb 14.9 oz)  Height:        Intake/Output Summary (Last 24 hours) at 06/25/16 1039 Last data filed at 06/25/16 1001  Gross per 24 hour  Intake              960 ml  Output              250 ml  Net              710 ml   Filed Weights   06/22/2016 1751 06/24/16 0358 06/25/16 0400  Weight: 112 kg (247 lb) 111.7 kg (246 lb 3.2 oz) 111.1 kg (244 lb 14.9 oz)    Examination:  General exam: Appears calm and comfortable  Respiratory system: Bibasilar crackles. Respiratory effort normal. Cardiovascular system: Irregularly irregular. No JVD, murmurs, rubs, gallops or clicks. 2+ bilateral lower extremity edema up to a 1/3 thigh. Gastrointestinal  system: Abdomen is nondistended, soft and nontender. No organomegaly or masses felt. Normal bowel sounds heard. Central nervous system: Alert and oriented. No focal neurological deficits. Extremities: Symmetric 5 x 5 power. Skin: No rashes, lesions or ulcers Psychiatry: Judgement and insight appear normal. Mood & affect appropriate.     Data Reviewed: I have personally reviewed following labs and imaging studies  CBC:  Recent Labs Lab 07/02/2016 1841 06/24/16 0904 06/25/16 0409  WBC 7.1 6.6 6.7  HGB 12.1 10.6* 10.9*  HCT 36.7 33.0* 34.2*  MCV 88.9 89.7 90.7  PLT 230 212 [id number]   Basic Metabolic Panel:  Recent Labs Lab 07/14/2016 1841 06/24/16 0904 06/25/16 0409 06/25/16 0808  NA 136 136 137  --   K 5.2* 4.7 5.4* 5.5*  CL 101 103 101  --   CO2 29 28 30   --  GLUCOSE 141* 131* 97  --   BUN 43* 43* 49*  --   CREATININE 2.15* 2.24* 2.53*  --   CALCIUM 9.8 9.3 9.4  --   MG 2.3  --   --   --   PHOS 2.8  --   --   --    GFR: Estimated Creatinine Clearance: 17.7 mL/min (by C-G formula based on SCr of 2.53 mg/dL (H)). Liver Function Tests:  Recent Labs Lab 07/02/2016 1841  AST 26  ALT 22  ALKPHOS 83  BILITOT 0.7  PROT 6.3*  ALBUMIN 3.2*   No results for input(s): LIPASE, AMYLASE in the last 168 hours. No results for input(s): AMMONIA in the last 168 hours. Coagulation Profile:  Recent Labs Lab 07/01/2016 1841  INR 1.13   Cardiac Enzymes:  Recent Labs Lab 06/27/2016 1841 06/24/16 0005 06/24/16 0904  TROPONINI 0.04* 0.05* 0.04*   BNP (last 3 results) No results for input(s): PROBNP in the last 8760 hours. HbA1C: No results for input(s): HGBA1C in the last 72 hours. CBG:  Recent Labs Lab 07/07/2016 2135  GLUCAP 143*   Lipid Profile: No results for input(s): CHOL, HDL, LDLCALC, TRIG, CHOLHDL, LDLDIRECT in the last 72 hours. Thyroid Function Tests:  Recent Labs  06/24/2016 1842  TSH 1.984   Anemia Panel: No results for input(s): VITAMINB12, FOLATE,  FERRITIN, TIBC, IRON, RETICCTPCT in the last 72 hours. Sepsis Labs: No results for input(s): PROCALCITON, LATICACIDVEN in the last 168 hours.  No results found for this or any previous visit (from the past 240 hour(s)).       Radiology Studies: US Renal  Result Date: 06/25/2016 CLINICAL DATA:  Acute renal failure. EXAM: RENAL / URINARY TRACT ULTRASOUND COMPLETE COMPARISON:  None. FINDINGS: Right Kidney: Length: 8.6 cm. Echogenicity within normal limits. No mass or hydronephrosis visualized. Left Kidney: Length: 10.8 cm. Echogenicity within normal limits. No mass or hydronephrosis visualized. Bladder: Decompressed and therefore not well visualized. IMPRESSION: Mild right renal atrophy. No hydronephrosis or renal obstruction is noted. Electronically Signed   By: Marijo Conception, M.D.   On: 06/25/2016 08:57        Scheduled Meds: . furosemide  60 mg Intravenous BID  . heparin subcutaneous  5,000 Units Subcutaneous Q8H  . pantoprazole  40 mg Oral Daily  . sodium chloride flush  3 mL Intravenous Q12H  . sodium polystyrene  45 g Oral Once   Continuous Infusions:   LOS: 2 days    Time spent: 1 minutes    Loyalty Arentz, MD [facility] Pager [phone]   If 7PM-7AM, please contact night-coverage [email or web address] Password [email or web address] 06/25/2016, 10:39 AM

## 2016-06-26 ENCOUNTER — Encounter (HOSPITAL_COMMUNITY): Payer: Self-pay | Admitting: *Deleted

## 2016-06-26 DIAGNOSIS — Z66 Do not resuscitate: Secondary | ICD-10-CM

## 2016-06-26 DIAGNOSIS — I82402 Acute embolism and thrombosis of unspecified deep veins of left lower extremity: Secondary | ICD-10-CM

## 2016-06-26 DIAGNOSIS — I5033 Acute on chronic diastolic (congestive) heart failure: Secondary | ICD-10-CM

## 2016-06-26 DIAGNOSIS — Z515 Encounter for palliative care: Secondary | ICD-10-CM

## 2016-06-26 LAB — COMPREHENSIVE METABOLIC PANEL
ALT: 26 U/L (ref 14–54)
AST: 32 U/L (ref 15–41)
Albumin: 2.9 g/dL — ABNORMAL LOW (ref 3.5–5.0)
Alkaline Phosphatase: 86 U/L (ref 38–126)
Anion gap: 8 (ref 5–15)
BUN: 51 mg/dL — AB (ref 6–20)
CHLORIDE: 99 mmol/L — AB (ref 101–111)
CO2: 29 mmol/L (ref 22–32)
CREATININE: 2.65 mg/dL — AB (ref 0.44–1.00)
Calcium: 8.9 mg/dL (ref 8.9–10.3)
GFR calc Af Amer: 17 mL/min — ABNORMAL LOW (ref >60)
GFR calc non Af Amer: 15 mL/min — ABNORMAL LOW (ref >60)
Glucose, Bld: 102 mg/dL — ABNORMAL HIGH (ref 65–99)
POTASSIUM: 4.4 mmol/L (ref 3.5–5.1)
Sodium: 136 mmol/L (ref 135–145)
Total Bilirubin: 0.5 mg/dL (ref 0.3–1.2)
Total Protein: 5.9 g/dL — ABNORMAL LOW (ref 6.5–8.1)

## 2016-06-26 LAB — GLUCOSE, CAPILLARY: GLUCOSE-CAPILLARY: 99 mg/dL (ref 65–99)

## 2016-06-26 LAB — HEPARIN LEVEL (UNFRACTIONATED)
HEPARIN UNFRACTIONATED: 0.54 [IU]/mL (ref 0.30–0.70)
Heparin Unfractionated: 0.97 [IU]/mL — ABNORMAL HIGH (ref 0.30–0.70)
Heparin Unfractionated: 0.84 [IU]/mL — ABNORMAL HIGH (ref 0.30–0.70)

## 2016-06-26 LAB — CBC
HEMATOCRIT: 34.8 % — AB (ref 36.0–46.0)
HEMOGLOBIN: 11 g/dL — AB (ref 12.0–15.0)
MCH: 28.6 pg (ref 26.0–34.0)
MCHC: 31.6 g/dL (ref 30.0–36.0)
MCV: 90.6 fL (ref 78.0–100.0)
Platelets: 221 10*3/uL (ref 150–400)
RBC: 3.84 MIL/uL — ABNORMAL LOW (ref 3.87–5.11)
RDW: 15.9 % — ABNORMAL HIGH (ref 11.5–15.5)
WBC: 8.5 10*3/uL (ref 4.0–10.5)

## 2016-06-26 LAB — URINE CULTURE

## 2016-06-26 LAB — MAGNESIUM: Magnesium: 2.1 mg/dL (ref 1.7–2.4)

## 2016-06-26 LAB — PARATHYROID HORMONE, INTACT (NO CA): PTH: 41 pg/mL (ref 15–65)

## 2016-06-26 LAB — PHOSPHORUS: Phosphorus: 4.6 mg/dL (ref 2.5–4.6)

## 2016-06-26 LAB — URIC ACID: Uric Acid, Serum: 12.1 mg/dL — ABNORMAL HIGH (ref 2.3–6.6)

## 2016-06-26 MED ORDER — ENSURE ENLIVE PO LIQD
237.0000 mL | Freq: Two times a day (BID) | ORAL | Status: DC
  Administered 2016-06-26 – 2016-06-30 (×4): 237 mL via ORAL

## 2016-06-26 NOTE — Consult Note (Signed)
Consultation Note Date: 06/26/2016   Patient Name: Christina Martin  DOB: February 14, 1925  MRN: [id number]  Age / Sex: 83 y.o., female  PCP: Colon Branch, MD Referring Physician: Robbie Lis, MD  Reason for Consultation: Establishing goals of care and Psychosocial/spiritual support  HPI/Patient Profile: 83 y.o. female   admitted on 06/26/2016 with past medical history significant for chronic diastolic CHF (last 2 D ECHO in 03/2015 showed EF 65% and grade 1 DD), CKD stage 4 with baseline creatinine 1.80 back in 03/2015, hypertension, GERD.   She presented to [facility] with worsening lower extremity swelling and difficulty ambulating over past 2 weeks prior to this admission. She also had shortness of breath with exertion.   In the ED her  blood work showed potassium of 5.2, Cr 2.15, troponin 0.04.  She was transferred to [facility] for further management  Patient and her family face advanced directive decisions and anticipatory care needs   Clinical Assessment and Goals of Care:    This NP Wadie Lessen reviewed medical records, received report from team, assessed the patient and then meet at the patient's bedside along with daughter/Christina Martin and patient's son  to discuss diagnosis, prognosis, GOC, EOL wishes disposition and options.  A detailed discussion was had today regarding advanced directives.  Concepts specific to code status, artifical feeding and hydration, continued IV antibiotics and rehospitalization was had.  The difference between a aggressive medical intervention path  and a palliative comfort care path for this patient at this time was had.  Values and goals of care important to patient and family were attempted to be elicited.  MOST form introduced  Concept of Hospice and Palliative Care were discussed  Natural trajectory and expectations at EOL were discussed.  Questions and concerns  addressed.   Family encouraged to call with questions or concerns.  PMT will continue to support holistically.    HCPOA/daughter Christina     SUMMARY OF RECOMMENDATIONS    Code Status/Advance Care Planning:  DNRdocumented today   Palliative Prophylaxis:   Aspiration, Bowel Regimen, Frequent Pain Assessment and Oral Care  Additional Recommendations (Limitations, Scope, Preferences):  Full Scope Treatment  Psycho-social/Spiritual:   Desire for further Chaplaincy support:yes  Additional Recommendations: Education on Hospice  Prognosis:   Unable to determine  Discharge Planning: To Be Determined      Primary Diagnoses: Present on Admission: . Atrial fibrillation ([place]) . Hyperkalemia . Elevated troponin . Lower extremity edema . GERD without esophagitis . Left leg DVT  08-2014 . Acute renal failure superimposed on stage 4 chronic kidney disease ([place]) . Acute on chronic diastolic heart failure ([place]) . Essential hypertension   I have reviewed the medical record, interviewed the patient and family, and examined the patient. The following aspects are pertinent.  Past Medical History:  Diagnosis Date  . ATONIC BLADDER-- has a permananet suprapubic catheter    . Calculus in urethra    Dr. Diona Fanti  . Diastolic dysfunction, left ventricle 05/30/2013   05/2013 see ECHO   .  DJD (degenerative joint disease)   . GERD (gastroesophageal reflux disease)   . HTN (hypertension) 12/30/2007  . Nonspecific elevation of levels of transaminase or lactic acid dehydrogenase (LDH) 1996   + Hepatitis srology  . Prediabetes 04/07/2014  . Uterine cancer  (h/O)    no recent f/u    Social History   Social History  . Marital status: Widowed    Spouse name: N/A  . Number of children: 3  . Years of education: N/A   Occupational History  . Retired-office work    Social History Main Topics  . Smoking status: Former Smoker    Quit date: 05/22/1986  . Smokeless tobacco: Never Used      Comment: smoked 1944-1988 , up to 1 ppd  . Alcohol use No  . Drug use: No  . Sexual activity: Not on file   Other Topics Concern  . Not on file   Social History Narrative   Lives by herself, doesn't drive, family in the area      Family History  Problem Relation Age of Onset  . Colon cancer Mother   . Diabetes Mother     mother, brother  . Stroke Mother     > 45  . Liver disease Sister     non hepatitis  . Heart failure Sister   . CAD Brother     brother, mother , sister  . Breast cancer Neg Hx    Scheduled Meds: . docusate sodium  100 mg Oral Daily  . furosemide  60 mg Intravenous BID  . midodrine  5 mg Oral TID WC  . multivitamin with minerals  1 tablet Oral Daily  . pantoprazole  40 mg Oral Daily  . sodium chloride flush  3 mL Intravenous Q12H  . sodium chloride irrigation  70 mL Irrigation See admin instructions   Continuous Infusions: . amiodarone 30 mg/hr (06/26/16 1100)  . heparin 900 Units/hr (06/26/16 0516)   PRN Meds:.acetaminophen, hydrocortisone cream, lactulose, ondansetron **OR** ondansetron (ZOFRAN) IV, traMADol Medications Prior to Admission:  Prior to Admission medications   Medication Sig Start Date End Date Taking? Authorizing Provider  acetaminophen (TYLENOL) 500 MG tablet Take 1,000 mg by mouth every 6 (six) hours as needed (pain).   Yes Historical Provider, MD  benazepril (LOTENSIN) 40 MG tablet Take 1 tablet (40 mg total) by mouth daily. 03/13/16  Yes Colon Branch, MD  Calcium Carbonate-Vitamin D (CALCIUM 600 + D PO) Take 1 tablet by mouth daily. \   Yes Historical Provider, MD  Docusate Calcium (STOOL SOFTENER PO) Take 1 capsule by mouth daily.    Yes Historical Provider, MD  furosemide (LASIX) 80 MG tablet Take 1 tablet (80 mg total) by mouth 2 (two) times daily. 05/09/16  Yes Burnell Blanks, MD  hydrocortisone cream 1 % Apply 1 application topically daily as needed for itching (redness/ irritation).   Yes Historical Provider, MD    Methylcellulose, Laxative, (CITRUCEL PO) Take 2 tablets by mouth daily at 12 noon. Reported on 07/09/2015   Yes Historical Provider, MD  Multiple Vitamin (MULTIVITAMIN WITH MINERALS) TABS tablet Take 1 tablet by mouth daily.   Yes Historical Provider, MD  Omega-3 Fatty Acids (FISH OIL) 1200 MG CAPS Take 1,200 mg by mouth daily.    Yes Historical Provider, MD  omeprazole (PRILOSEC OTC) 20 MG tablet Take 20 mg by mouth 4 (four) times a week. Sunday, Tuesday, Thursday, Saturday   Yes Historical Provider, MD  potassium chloride SA (KLOR-CON  M20) 20 MEQ tablet Take 1 tablet (20 mEq total) by mouth daily. 05/09/16  Yes Burnell Blanks, MD  sodium chloride irrigation 0.9 % irrigation 70 mLs See admin instructions. Use 70 ml to flush catheter daily 05/17/16  Yes Historical Provider, MD  traMADol (ULTRAM) 50 MG tablet Take 1 tablet (50 mg total) by mouth 3 (three) times daily as needed. Patient taking differently: Take 50 mg by mouth 2 (two) times daily.  05/23/16  Yes Colon Branch, MD  VITAMIN E PO Take 1 capsule by mouth daily.   Yes Historical Provider, MD  XARELTO 20 MG TABS tablet TAKE 1 TABLET (20 MG TOTAL) BY MOUTH DAILY WITH SUPPER. Patient taking differently: TAKE 1/2 TABLET (10 MG) BY MOUTH DAILY WITH SUPPER (UNTIL OFFICE VISIT APRIL 2018) 06/12/16  Yes Volanda Napoleon, MD  augmented betamethasone dipropionate (DIPROLENE-AF) 0.05 % cream Apply topically 2 (two) times daily. Patient not taking: Reported on 07/17/2016 09/01/15   Colon Branch, MD  lactulose [facility]) 10 GM/15ML solution Take 15 mLs (10 g total) by mouth daily as needed for mild constipation. Patient not taking: Reported on 06/30/2016 09/01/14   Colon Branch, MD   Allergies  Allergen Reactions  . Lithostat [Acetohydroxamic Acid] Other (See Comments)    Unusual tiredness, weakness, nervousness, mental confusion, sore throat, Poor appetite   Review of Systems  Constitutional: Positive for activity change and fatigue.  Cardiovascular:  Positive for leg swelling.  Neurological: Positive for weakness.    Physical Exam  Constitutional: She is oriented to person, place, and time. She appears well-developed. She appears ill.  Cardiovascular: Normal rate, regular rhythm and normal heart sounds.   BLE +1 edema  Pulmonary/Chest: She has decreased breath sounds in the right lower field and the left lower field.  Abdominal: Soft. Bowel sounds are decreased.  Neurological: She is alert and oriented to person, place, and time.  Skin: Skin is warm and dry.  Psychiatric: Judgment normal. Cognition and memory are normal.    Vital Signs: BP (!) 100/58 (BP Location: Right Arm)   Pulse 88   Temp 97.5 F (36.4 C) (Oral)   Resp 15   Ht 5\' 2"  (1.575 m)   Wt 111.3 kg (245 lb 6 oz)   SpO2 95%   BMI 44.88 kg/m  Pain Assessment: No/denies pain   Pain Score: 4    SpO2: SpO2: 95 % O2 Device:SpO2: 95 % O2 Flow Rate: .O2 Flow Rate (L/min): 2 L/min  IO: Intake/output summary:  Intake/Output Summary (Last 24 hours) at 06/26/16 1126 Last data filed at 06/26/16 1100  Gross per 24 hour  Intake            988.7 ml  Output              750 ml  Net            238.7 ml    LBM: Last BM Date: 06/25/16 Baseline Weight: Weight: 112 kg (247 lb) Most recent weight: Weight: 111.3 kg (245 lb 6 oz)      Palliative Assessment/Data: 40 %   Flowsheet Rows   Flowsheet Row Most Recent Value  Intake Tab  Referral Department  Hospitalist  Unit at Time of Referral  Cardiac/Telemetry Unit  Palliative Care Primary Diagnosis  Cardiac  Date Notified  07/06/2016  Palliative Care Type  New Palliative care  Reason for referral  Clarify Goals of Care  Date of Admission  07/10/2016  Date first seen by Palliative  Care  06/27/16  # of days Palliative referral response time  4 Day(s)  # of days IP prior to Palliative referral  0  Clinical Assessment  Psychosocial & Spiritual Assessment  Palliative Care Outcomes     Discussed with nursing staff Time  In: 1130 Time Out: 1245 Time Total: 75 min Greater than 50%  of this time was spent counseling and coordinating care related to the above assessment and plan.  Signed by: Wadie Lessen, NP   Please contact Palliative Medicine Team phone at [phone] for questions and concerns.  For individual provider: See Shea Evans

## 2016-06-26 NOTE — Progress Notes (Addendum)
Patient ID: Christina Martin, female   DOB: Apr 10, 1925, 83 y.o.   MRN: [id number]  PROGRESS NOTE    Christina Martin  [id number] DOB: [birth year] DOA: 07/02/2016  PCP: [name], MD   Brief Narrative:  83 y.o.femalewith medical history significant for chronic diastolic CHF (last 2 D ECHO in 03/2015 showed EF 65% and grade 1 DD), CKD stage 4 with baseline creatinine 1.80 back in 03/2015, hypertension, GERD. She presented to [facility] with worsening lower extremity swelling and difficulty ambulating over past 2 weeks prior to this admission. She also had shortness of breath with exertion. She took extra dose of lasix for past 1 week without significant symptomatic relief.   Pt was hemodynamically stable. Her blood work showed potassium of 5.2, Cr 2.15, troponin 0.04. The 12 lead EKG showed atrial fibrillation. She was started on IV lasix 60 mg IV Q 12 hours. Cardio seen in consultation. Due to renal insufficiency nephrology has been consulted as well.    Assessment & Plan:   Lower extremity edema /Acute on chronic diastolic heart failure (HCC) - ECHO with a EF of 60-65%, no wall motion abnormalities, moderate left atrial enlargement and severe RAE, moderate TR IVC dilated. - Started lasix 60 mg IV Q 12 hours but her BP on soft side so now on midodrine 5 mg TID which can support BP and allow adequate diuresis     New onset atrial fibrillation - CHADS vasc score 5 - On amiodarone drip - Initially on xarelto but this on hold now due to renal insufficiency so she is on heparin drip - Cardio to hold off on cardioversion since rate is controlled    Hx of Left leg DVT 08-2014 - Decreased the dose to 10 mg a day by hematology and she was supposed to be on it through 08/2016.  - Xarelto on hold due to worsening renal function - Order placed for heparin drip - Lower extremity Doppler negative for DVT.  Essential hypertension - Lisinopril on hold due to worsening Cr - On IV lasix,  midodrine   Hyperkalemia - Due to supplementation and worsening renal function. - Will correct with lasix   Elevated troponin - Likely demand ischemia due to CKD, acute CHF - Troponin 0.04, 0.05 and 0.04 - No chest pain - The 12 lead EKG showed a fib with RVR  GERD without esophagitis - Continue protonix   Acute renal failure superimposed on stage 4 chronic kidney disease (HCC) - Cr baseline is 1.6 on 02/2016 and on this admission 2.15 which has worsened to 2.53 on 06/25/2016. - Renal ultrasound with mild right renal atrophy. No hydronephrosis or renal obstruction noted. - Per renal, likely low perfusion in setting of ACEi, AIN, recommended holding diuresis 24 hours to allow to reequilibriate and then to resume low dose lasix  Anemia of chronic kidney disease - Hgb drop from 12.1 to 10.6 - No evidence of gross bleed - Hgb this am 11 - Xarelto on hold due ot renal insufficiency.   DVT prophylaxis: On heparin drip  Code Status: Full Family Communication: No family at the bedside this am Disposition Plan: Home versus SNF once cleared by renal and cardio; still on IV lasix     Consultants:   Cardiology: Dr Debara Pickett 06/24/2016  Renal  Procedures:   Renal ultrasound 06/25/2016  Lower extremity dopplers 06/24/2016  2-D echo 06/24/2016 - EF 60%  Antimicrobials:   None   Subjective: No overnight events.   Objective: Vitals:  06/26/16 0400 06/26/16 0600 06/26/16 0823 06/26/16 1240  BP: (!) 98/50 (!) 89/54 (!) 100/58 (!) 83/47  Pulse: 88 82 88 64  Resp: 14 14 15 19   Temp:   97.5 F (36.4 C) 97.7 F (36.5 C)  TempSrc:   Oral Oral  SpO2: 98% 96% 95% 100%  Weight:      Height:        Intake/Output Summary (Last 24 hours) at 06/26/16 1409 Last data filed at 06/26/16 1300  Gross per 24 hour  Intake           1006.7 ml  Output              650 ml  Net            356.7 ml   Filed Weights   06/24/16 0358 06/25/16 0400 06/26/16 0326  Weight:  111.7 kg (246 lb 3.2 oz) 111.1 kg (244 lb 14.9 oz) 111.3 kg (245 lb 6 oz)    Examination:  General exam: Appears calm and comfortable  Respiratory system: Clear to auscultation. Respiratory effort normal. Cardiovascular system: S1 & S2 heard, Rate controlled  Gastrointestinal system: Abdomen is nondistended, soft and nontender. No organomegaly or masses felt. Normal bowel sounds heard. Central nervous system: Alert and oriented. No focal neurological deficits. Extremities: +3 LE pitting edema, redness of bilateral extremities with chronic venous skin changes  Skin: No rashes, lesions or ulcers Psychiatry: Judgement and insight appear normal. Mood & affect appropriate.   Data Reviewed: I have personally reviewed following labs and imaging studies  CBC:  Recent Labs Lab 06/26/2016 1841 06/24/16 0904 06/25/16 0409 06/25/16 1412 06/26/16 0320  WBC 7.1 6.6 6.7  --  8.5  NEUTROABS  --   --   --  4.3  --   HGB 12.1 10.6* 10.9*  --  11.0*  HCT 36.7 33.0* 34.2*  --  34.8*  MCV 88.9 89.7 90.7  --  90.6  PLT 230 212 222  --  [id number]   Basic Metabolic Panel:  Recent Labs Lab 07/16/2016 1841 06/24/16 0904 06/25/16 0409 06/25/16 0808 06/25/16 1256 06/26/16 0320  NA 136 136 137  --  137 136  K 5.2* 4.7 5.4* 5.5* 4.8 4.4  CL 101 103 101  --  99* 99*  CO2 29 28 30   --  29 29  GLUCOSE 141* 131* 97  --  111* 102*  BUN 43* 43* 49*  --  50* 51*  CREATININE 2.15* 2.24* 2.53*  --  2.54* 2.65*  CALCIUM 9.8 9.3 9.4  --  9.5 8.9  MG 2.3  --   --   --   --  2.1  PHOS 2.8  --   --   --   --  4.6   GFR: Estimated Creatinine Clearance: 16.9 mL/min (by C-G formula based on SCr of 2.65 mg/dL (H)). Liver Function Tests:  Recent Labs Lab 06/30/2016 1841 06/26/16 0320  AST 26 32  ALT 22 26  ALKPHOS 83 86  BILITOT 0.7 0.5  PROT 6.3* 5.9*  ALBUMIN 3.2* 2.9*   No results for input(s): LIPASE, AMYLASE in the last 168 hours. No results for input(s): AMMONIA in the last 168 hours. Coagulation  Profile:  Recent Labs Lab 07/12/2016 1841  INR 1.13   Cardiac Enzymes:  Recent Labs Lab 06/26/2016 1841 06/24/16 0005 06/24/16 0904  TROPONINI 0.04* 0.05* 0.04*   BNP (last 3 results) No results for input(s): PROBNP in the last 8760 hours. HbA1C: No results  for input(s): HGBA1C in the last 72 hours. CBG:  Recent Labs Lab 07/17/2016 2135 06/25/16 2158 06/26/16 0825  GLUCAP 143* 114* 99   Lipid Profile: No results for input(s): CHOL, HDL, LDLCALC, TRIG, CHOLHDL, LDLDIRECT in the last 72 hours. Thyroid Function Tests:  Recent Labs  07/14/2016 1842  TSH 1.984   Anemia Panel:  Recent Labs  06/25/16 1412  TIBC 364  IRON 48   Urine analysis:    Component Value Date/Time   COLORURINE YELLOW 06/22/2016 1834   APPEARANCEUR CLOUDY (A) 07/02/2016 1834   LABSPEC 1.013 07/05/2016 1834   PHURINE 5.0 07/06/2016 1834   GLUCOSEU NEGATIVE 07/14/2016 1834   HGBUR SMALL (A) 07/18/2016 1834   BILIRUBINUR NEGATIVE 07/12/2016 [place] 07/08/2016 1834   PROTEINUR NEGATIVE 07/07/2016 1834   NITRITE NEGATIVE 06/29/2016 1834   LEUKOCYTESUR LARGE (A) 07/09/2016 1834   Sepsis Labs: @LABRCNTIP (procalcitonin:4,lacticidven:4)    Urine culture     Status: Abnormal   Collection Time: 06/25/16  8:16 AM  Result Value Ref Range Status   Specimen Description URINE, SUPRAPUBIC  Final   Special Requests NONE  Final   Culture MULTIPLE SPECIES PRESENT, SUGGEST RECOLLECTION (A)  Final   Report Status 06/26/2016 FINAL  Final  MRSA PCR Screening     Status: None   Collection Time: 06/25/16  5:07 PM  Result Value Ref Range Status   MRSA by PCR NEGATIVE NEGATIVE Final      Radiology Studies: US Renal Result Date: 06/25/2016 Mild right renal atrophy. No hydronephrosis or renal obstruction is noted. Electronically Signed   By: Marijo Conception, M.D.   On: 06/25/2016 08:57     Scheduled Meds: . docusate sodium  100 mg Oral Daily  . feeding supplement (ENSURE ENLIVE)  237  mL Oral BID BM  . furosemide  60 mg Intravenous BID  . midodrine  5 mg Oral TID WC  . multivitamin with minerals  1 tablet Oral Daily  . pantoprazole  40 mg Oral Daily  . sodium chloride flush  3 mL Intravenous Q12H  . sodium chloride irrigation  70 mL Irrigation See admin instructions   Continuous Infusions: . amiodarone 30 mg/hr (06/26/16 1300)  . heparin 900 Units/hr (06/26/16 0516)     LOS: 3 days    Time spent: 25 minutes  Greater than 50% of the time spent on counseling and coordinating the care.   Leisa Lenz, MD [facility] Pager [phone]  If 7PM-7AM, please contact night-coverage [email or web address] Password [email or web address] 06/26/2016, 2:09 PM

## 2016-06-26 NOTE — Progress Notes (Signed)
Progress Note  Patient Name: Christina Martin Date of Encounter: 06/26/2016  Primary Cardiologist: Angelena Form  Subjective   No complaints. Denies chest pain or dyspnea.   Inpatient Medications    Scheduled Meds: . docusate sodium  100 mg Oral Daily  . furosemide  60 mg Intravenous BID  . midodrine  5 mg Oral TID WC  . multivitamin with minerals  1 tablet Oral Daily  . pantoprazole  40 mg Oral Daily  . sodium chloride flush  3 mL Intravenous Q12H  . sodium chloride irrigation  70 mL Irrigation See admin instructions   Continuous Infusions: . amiodarone 30 mg/hr (06/25/16 2000)  . heparin 900 Units/hr (06/26/16 0516)   PRN Meds: acetaminophen, hydrocortisone cream, lactulose, ondansetron **OR** ondansetron (ZOFRAN) IV, traMADol   Vital Signs    Vitals:   06/26/16 0326 06/26/16 0400 06/26/16 0600 06/26/16 0823  BP: (!) 91/46 (!) 98/50 (!) 89/54 (!) 100/58  Pulse: 97 88 82 88  Resp: 19 14 14 15   Temp: 98 F (36.7 C)   97.5 F (36.4 C)  TempSrc: Oral   Oral  SpO2: 97% 98% 96% 95%  Weight: 245 lb 6 oz (111.3 kg)     Height:        Intake/Output Summary (Last 24 hours) at 06/26/16 0836 Last data filed at 06/26/16 0800  Gross per 24 hour  Intake           1051.7 ml  Output              900 ml  Net            151.7 ml   Filed Weights   06/24/16 0358 06/25/16 0400 06/26/16 0326  Weight: 246 lb 3.2 oz (111.7 kg) 244 lb 14.9 oz (111.1 kg) 245 lb 6 oz (111.3 kg)    Telemetry    Atrial fib - Personally Reviewed   Physical Exam   GEN: No acute distress.   Neck: No JVD Cardiac: Irreg irreg, no murmurs, rubs, or gallops.  Respiratory: Clear to auscultation bilaterally. GI: Soft, nontender, non-distended  Ext: 2-3+ bilateral lower ext edema.  Neuro:  Nonfocal  Psych: Normal affect   Labs    Chemistry Recent Labs Lab 07/09/2016 1841  06/25/16 0409 06/25/16 0808 06/25/16 1256 06/26/16 0320  NA 136  < > 137  --  137 136  K 5.2*  < > 5.4* 5.5* 4.8 4.4  CL  101  < > 101  --  99* 99*  CO2 29  < > 30  --  29 29  GLUCOSE 141*  < > 97  --  111* 102*  BUN 43*  < > 49*  --  50* 51*  CREATININE 2.15*  < > 2.53*  --  2.54* 2.65*  CALCIUM 9.8  < > 9.4  --  9.5 8.9  PROT 6.3*  --   --   --   --  5.9*  ALBUMIN 3.2*  --   --   --   --  2.9*  AST 26  --   --   --   --  32  ALT 22  --   --   --   --  26  ALKPHOS 83  --   --   --   --  86  BILITOT 0.7  --   --   --   --  0.5  GFRNONAA 19*  < > 16*  --  16* 15*  GFRAA  22*  < > 18*  --  18* 17*  ANIONGAP 6  < > 6  --  9 8  < > = values in this interval not displayed.   Hematology Recent Labs Lab 06/24/16 0904 06/25/16 0409 06/26/16 0320  WBC 6.6 6.7 8.5  RBC 3.68* 3.77* 3.84*  HGB 10.6* 10.9* 11.0*  HCT 33.0* 34.2* 34.8*  MCV 89.7 90.7 90.6  MCH 28.8 28.9 28.6  MCHC 32.1 31.9 31.6  RDW 15.7* 16.1* 15.9*  PLT 212 222 221    Cardiac Enzymes Recent Labs Lab 07/19/2016 1841 06/24/16 0005 06/24/16 0904  TROPONINI 0.04* 0.05* 0.04*   No results for input(s): TROPIPOC in the last 168 hours.   BNPNo results for input(s): BNP, PROBNP in the last 168 hours.   DDimer No results for input(s): DDIMER in the last 168 hours.   Radiology    US Renal  Result Date: 06/25/2016 CLINICAL DATA:  Acute renal failure. EXAM: RENAL / URINARY TRACT ULTRASOUND COMPLETE COMPARISON:  None. FINDINGS: Right Kidney: Length: 8.6 cm. Echogenicity within normal limits. No mass or hydronephrosis visualized. Left Kidney: Length: 10.8 cm. Echogenicity within normal limits. No mass or hydronephrosis visualized. Bladder: Decompressed and therefore not well visualized. IMPRESSION: Mild right renal atrophy. No hydronephrosis or renal obstruction is noted. Electronically Signed   By: Marijo Conception, M.D.   On: 06/25/2016 08:57    Cardiac Studies   Echo 06/24/16: Left ventricle: The cavity size was normal. Wall thickness was   increased in a pattern of mild LVH. Systolic function was normal.   The estimated ejection fraction  was in the range of 60% to 65%.   Wall motion was normal; there were no regional wall motion   abnormalities. The study is not technically sufficient to allow   evaluation of LV diastolic function. - Aortic valve: Trileaflet. Sclerosis without stenosis. There was   no regurgitation. - Mitral valve: Mildly thickened leaflets . There was trivial   regurgitation. - Left atrium: Moderately dilated. - Right ventricle: The cavity size was moderately dilated. Systolic   function is mildly reduced. - Tricuspid valve: There was moderate regurgitation. - Pulmonary arteries: PA peak pressure: 39 mm Hg (S). - Inferior vena cava: The vessel was dilated. The respirophasic   diameter changes were blunted (< 50%), consistent with elevated   central venous pressure.  Impressions:  - Compared to a prior study in 2016, the LVEF is stable at 60-65%.   There is now moderate LAE and severe RAE, moderate TR with a   lower RVSP at 39 mmHg, the IVC is dilated. Rhythm is noted to be   atrial fibrillation.  Patient Profile     83 y.o. female with history of chronic diastolic CHF, chronic LE edema. Chronic kidney disease, chronic DVT on Xarelto admitted with leg swelling and weakness and found to be in atrial fib with RVR  Assessment & Plan    1. Acute on chronic diastolic CHF: She is volume overloaded. Worsened renal function. Normal LV systolic function but possible mild RV dysfunction. Likely exacerbated by new onset atrial fib. Lasix on hold per Nephrology due to renal insufficiency. Will ask Nephrology if we should push Lasix today and consider low dose milrinone. Will discuss with Nephrology.   2. Atrial fib with RVR: Rate is controlled this am on amiodarone drip. Unable to add in beta blocker due to hypotension. CHADS VASC 5. She had been on Xarelto but now on hold with renal insufficiency. She may  need coumadin at time of discharge if renal function does not improve. Will hold off on cardioversion as  her rate is controlled. She is now on IV heparin.   Signed, Lauree Chandler, MD  06/26/2016, 8:36 AM

## 2016-06-26 NOTE — Progress Notes (Signed)
Subjective: Interval History: No complaints. Denies SOB with rest. No nausea or vomiting. Tolerating PO.   Objective: Vital signs in last 24 hours: Temp:  [97.5 F (36.4 C)-99 F (37.2 C)] 97.7 F (36.5 C) (02/05 1240) Pulse Rate:  [64-109] 64 (02/05 1240) Resp:  [14-23] 19 (02/05 1240) BP: (83-113)/(46-91) 83/47 (02/05 1240) SpO2:  [93 %-100 %] 100 % (02/05 1240) Weight:  [245 lb 6 oz (111.3 kg)] 245 lb 6 oz (111.3 kg) (02/05 0326) Weight change: 7.1 oz (0.2 kg)  Intake/Output from previous day: 02/04 0701 - 02/05 0700 In: 1042.7 [P.O.:720; I.V.:252.7] Out: 900 [Urine:900] Intake/Output this shift: Total I/O In: 213 [P.O.:150; I.V.:63] Out: -   Gen: pleasant female, sitting in bedside chair, NAD  CV: Irregularly irregular. No murmurs appreciated.  Lungs: Faint bibasilar crackles. Normal WOB. [place] in place.  GI: soft, NTND  Ext: 2-3+ bilateral LE edema with overlying tight skin, venous stasis changes present of skin    Lab Results:  Recent Labs  06/25/16 0409 06/26/16 0320  WBC 6.7 8.5  HGB 10.9* 11.0*  HCT 34.2* 34.8*  PLT 222 221   BMET:  Recent Labs  06/25/16 1256 06/26/16 0320  NA 137 136  K 4.8 4.4  CL 99* 99*  CO2 29 29  GLUCOSE 111* 102*  BUN 50* 51*  CREATININE 2.54* 2.65*  CALCIUM 9.5 8.9    Recent Labs  06/25/16 1412  PTH 41   Iron Studies:  Recent Labs  06/25/16 1412  IRON 48  TIBC 364   Studies/Results: US Renal  Result Date: 06/25/2016 CLINICAL DATA:  Acute renal failure. EXAM: RENAL / URINARY TRACT ULTRASOUND COMPLETE COMPARISON:  None. FINDINGS: Right Kidney: Length: 8.6 cm. Echogenicity within normal limits. No mass or hydronephrosis visualized. Left Kidney: Length: 10.8 cm. Echogenicity within normal limits. No mass or hydronephrosis visualized. Bladder: Decompressed and therefore not well visualized. IMPRESSION: Mild right renal atrophy. No hydronephrosis or renal obstruction is noted. Electronically Signed   By: Marijo Conception,  M.D.   On: 06/25/2016 08:57    I have reviewed the patient's current medications.  Assessment/Plan: Christina Martin is 83 y.o. female with PMH of chronic diastolic CHF, CKD4, neurogenic bladder with SPC in place,  And chronic DVT on Xarelto who was admitted with lower extremity swelling and weakness. Patient was found to be in Afib with RVR. She has received diuresis for an acute exacerbation of CHF. Found to have an AKI at admission that worsened, so nephrology became involved in her care.   1. AKI: Baseline Cr 1.6-1.8.  Cr 2.15 at admission and now 2.5.  Patient appears to be volume overloaded. Suspect low perfusion given persistent hypotension complicated by use of AceI at home. Urine with pyuria. Urine eosinophils pending to work up AIN. Lasix held yesterday. Agree with continued diuresis today.  2. Hypotension: Requiring Midodrine. Cardiology inquiring about use of Milrinone.  3. Anemia: With low-normal Fe and saturation ratio of 13%. Would give Feraheme.  4. AFib with RVR: Rate controlled with amiodarone drip. IV heparin.  5. Acute CHF exacerbation: With volume excess. Diuresis.    LOS: 3 days   [name] 06/26/2016,3:27 PM  Renal Attending: Hemodynamically mediated AKI/CKD. Afib and hypotension problematic.  Volume overloaded. If milrinone does not exacerbate the BP or HR it would be ok, but that may be problematic. I agree with resumption of furosemide. Jlon Betker C

## 2016-06-26 NOTE — Progress Notes (Signed)
Initial Nutrition Assessment  DOCUMENTATION CODES:   Morbid obesity  INTERVENTION:  Ensure Enlive po BID, each supplement provides 350 kcal and 20 grams of protein  NUTRITION DIAGNOSIS:   Inadequate oral intake related to poor appetite as evidenced by per patient/family report.  GOAL:   Patient will meet greater than or equal to 90% of their needs  MONITOR:   PO intake, Supplement acceptance, I & O's, Labs  REASON FOR ASSESSMENT:   Malnutrition Screening Tool    ASSESSMENT:   83 y.o. female with medical history significant for chronic diastolic CHF (last 2 D ECHO in 03/2015 showed EF 65% and grade 1 DD), CKD stage 4 with baseline creatinine 1.80 back in 03/2015, hypertension, GERD. She presented to [facility] with worsening lower extremity swelling and difficulty ambulating over past 2 weeks prior to this admission. She also had shortness of breath with exertion. Admitted for CHF  Met with pt in room today. Pt reports poor appetite and oral intake for 2 months pta. Pt reports that food just doesn't taste good to her anymore. Per chart, pt with weight gain likely related to severe edema. Pt with severe edema in bilateral lower extremities. Pt likely weight stable. Pt reports eating 50% meals in hospital. RD explained to pt that she is on a renal diet with a fluid restriction and explained to pt what this meant. Pt with hyperkalemia on admit; now resolved. RD will order Ensure. Pt on MVI. RD will continue to monitor labs.     Medications reviewed and include: colace, lasix, MVI, protonix, heparin  Labs reviewed: K 4.4 wnl, P 4.6 wnl, Mg 2.1 wnl, Cl 99(L), BUN 51(H), creat 2.65(H), Alb 2.9(L), uric acid 12.1(H)  Nutrition-Focused physical exam completed. Findings are no fat depletion, no muscle depletion, and severe edema.   Diet Order:  Diet renal with fluid restriction Fluid restriction: 1200 mL Fluid; Room service appropriate? Yes; Fluid consistency: Thin  Skin:   Reviewed, no issues  Last BM:  2/4  Height:   Ht Readings from Last 1 Encounters:  07/06/2016 _0  (1.575 m)    Weight:   Wt Readings from Last 1 Encounters:  06/26/16 245 lb 6 oz (111.3 kg)    Ideal Body Weight:  50 kg  BMI:  Body mass index is 44.88 kg/m.  Estimated Nutritional Needs:   Kcal:  1600-1900kcal/day   Protein:  81-111g/day   Fluid:  >1.6L/day   EDUCATION NEEDS:   No education needs identified at this time  Koleen Distance, RD, LDN Pager #[phone]

## 2016-06-26 NOTE — Progress Notes (Signed)
[place] for heparin  Indication: atrial fibrillation  Allergies  Allergen Reactions  . Lithostat [Acetohydroxamic Acid] Other (See Comments)    Unusual tiredness, weakness, nervousness, mental confusion, sore throat, Poor appetite   Patient Measurements: Height: 5\' 2"  (157.5 cm) Weight: 245 lb 6 oz (111.3 kg) IBW/kg (Calculated) : 50.1 Heparin Dosing Weight: 77  Vital Signs: Temp: 97.7 F (36.5 C) (02/05 1240) Temp Source: Oral (02/05 1240) BP: 83/47 (02/05 1240) Pulse Rate: 64 (02/05 1240)  Labs:  Recent Labs  06/22/2016 1841 06/24/16 0005 06/24/16 0904 06/25/16 0409 06/25/16 1256 06/25/16 2156 06/26/16 0320 06/26/16 1429  HGB 12.1  --  10.6* 10.9*  --   --  11.0*  --   HCT 36.7  --  33.0* 34.2*  --   --  34.8*  --   PLT 230  --  212 222  --   --  221  --   LABPROT 14.5  --   --   --   --   --   --   --   INR 1.13  --   --   --   --   --   --   --   HEPARINUNFRC  --   --   --   --   --  0.64 0.97* 0.84*  CREATININE 2.15*  --  2.24* 2.53* 2.54*  --  2.65*  --   TROPONINI 0.04* 0.05* 0.04*  --   --   --   --   --     Estimated Creatinine Clearance: 16.9 mL/min (by C-G formula based on SCr of 2.65 mg/dL (H)).  Assessment: 83 y.o. female with Afib, Xarelto on hold, for heparin. Repeat HL remains supratherapeutic at 0.84 on heparin 900 units/hr. Nurse reports no issues with infusion or bleeding.  Goal of Therapy:  Heparin level 0.3-0.7 units/ml Monitor platelets by anticoagulation protocol: Yes   Plan:  Decrease heparin to 750 units/hr 8h HL Daily HL/CBC Monitor s/sx of bleeding  Andrey Cota. Diona Foley, PharmD, BCPS Clinical Pharmacist [phone] 06/26/16 3:39 PM

## 2016-06-27 ENCOUNTER — Inpatient Hospital Stay (HOSPITAL_COMMUNITY): Payer: Medicare Other

## 2016-06-27 DIAGNOSIS — N170 Acute kidney failure with tubular necrosis: Secondary | ICD-10-CM

## 2016-06-27 DIAGNOSIS — I481 Persistent atrial fibrillation: Secondary | ICD-10-CM

## 2016-06-27 DIAGNOSIS — I480 Paroxysmal atrial fibrillation: Secondary | ICD-10-CM

## 2016-06-27 DIAGNOSIS — Z515 Encounter for palliative care: Secondary | ICD-10-CM

## 2016-06-27 DIAGNOSIS — Z66 Do not resuscitate: Secondary | ICD-10-CM

## 2016-06-27 LAB — BASIC METABOLIC PANEL
Anion gap: 12 (ref 5–15)
BUN: 58 mg/dL — AB (ref 6–20)
CALCIUM: 9.3 mg/dL (ref 8.9–10.3)
CO2: 29 mmol/L (ref 22–32)
CREATININE: 2.68 mg/dL — AB (ref 0.44–1.00)
Chloride: 95 mmol/L — ABNORMAL LOW (ref 101–111)
GFR calc Af Amer: 17 mL/min — ABNORMAL LOW (ref >60)
GFR calc non Af Amer: 15 mL/min — ABNORMAL LOW (ref >60)
Glucose, Bld: 112 mg/dL — ABNORMAL HIGH (ref 65–99)
POTASSIUM: 4.5 mmol/L (ref 3.5–5.1)
SODIUM: 136 mmol/L (ref 135–145)

## 2016-06-27 LAB — CBC
HCT: 35.1 % — ABNORMAL LOW (ref 36.0–46.0)
Hemoglobin: 11.1 g/dL — ABNORMAL LOW (ref 12.0–15.0)
MCH: 28.8 pg (ref 26.0–34.0)
MCHC: 31.6 g/dL (ref 30.0–36.0)
MCV: 90.9 fL (ref 78.0–100.0)
PLATELETS: 209 10*3/uL (ref 150–400)
RBC: 3.86 MIL/uL — AB (ref 3.87–5.11)
RDW: 16 % — ABNORMAL HIGH (ref 11.5–15.5)
WBC: 7.6 10*3/uL (ref 4.0–10.5)

## 2016-06-27 LAB — HEPARIN LEVEL (UNFRACTIONATED): Heparin Unfractionated: 0.37 IU/mL (ref 0.30–0.70)

## 2016-06-27 MED ORDER — FUROSEMIDE 10 MG/ML IJ SOLN
100.0000 mg | Freq: Three times a day (TID) | INTRAVENOUS | Status: DC
  Administered 2016-06-27 – 2016-06-28 (×3): 100 mg via INTRAVENOUS
  Filled 2016-06-27 (×4): qty 10

## 2016-06-27 NOTE — Progress Notes (Signed)
Progress Note  Patient Name: Christina Martin Date of Encounter: 06/27/2016  Primary Cardiologist: Coleman   Patient denies having any chest pain, SOB, or palpitations. No other complaints.   Inpatient Medications    Scheduled Meds: . docusate sodium  100 mg Oral Daily  . feeding supplement (ENSURE ENLIVE)  237 mL Oral BID BM  . furosemide  60 mg Intravenous BID  . midodrine  5 mg Oral TID WC  . multivitamin with minerals  1 tablet Oral Daily  . pantoprazole  40 mg Oral Daily  . sodium chloride flush  3 mL Intravenous Q12H  . sodium chloride irrigation  70 mL Irrigation See admin instructions   Continuous Infusions: . amiodarone 30 mg/hr (06/26/16 2350)  . heparin 750 Units/hr (06/26/16 1600)   PRN Meds: acetaminophen, hydrocortisone cream, lactulose, ondansetron **OR** ondansetron (ZOFRAN) IV, traMADol   Vital Signs    Vitals:   06/26/16 1553 06/26/16 1943 06/26/16 2338 06/27/16 0338  BP: (!) 92/46 (!) 105/53 (!) 100/58 (!) 99/53  Pulse: 86 92 92 97  Resp: (!) 25 18 20 14   Temp: 98.3 F (36.8 C) 98.2 F (36.8 C) 97.9 F (36.6 C) 97.9 F (36.6 C)  TempSrc: Oral Oral Oral Oral  SpO2: 95% 97% 94% 95%  Weight:    113.2 kg (249 lb 9 oz)  Height:        Intake/Output Summary (Last 24 hours) at 06/27/16 0735 Last data filed at 06/27/16 0400  Gross per 24 hour  Intake           1020.5 ml  Output              300 ml  Net            720.5 ml   Filed Weights   06/25/16 0400 06/26/16 0326 06/27/16 0338  Weight: 111.1 kg (244 lb 14.9 oz) 111.3 kg (245 lb 6 oz) 113.2 kg (249 lb 9 oz)    Telemetry    Atrial fib - Personally Reviewed   Physical Exam   GEN: No acute distress.   Neck: No JVD Cardiac: Irreg irreg, no murmurs, rubs, or gallops.  Respiratory: Rales appreciated at the LLB.  GI: Soft, nontender, non-distended  Ext: 2-3+ bilateral lower ext edema.  Neuro:  Nonfocal  Psych: Normal affect   Labs    Chemistry Recent Labs Lab  07/10/2016 1841  06/25/16 1256 06/26/16 0320 06/27/16 0317  NA 136  < > 137 136 136  K 5.2*  < > 4.8 4.4 4.5  CL 101  < > 99* 99* 95*  CO2 29  < > 29 29 29   GLUCOSE 141*  < > 111* 102* 112*  BUN 43*  < > 50* 51* 58*  CREATININE 2.15*  < > 2.54* 2.65* 2.68*  CALCIUM 9.8  < > 9.5 8.9 9.3  PROT 6.3*  --   --  5.9*  --   ALBUMIN 3.2*  --   --  2.9*  --   AST 26  --   --  32  --   ALT 22  --   --  26  --   ALKPHOS 83  --   --  86  --   BILITOT 0.7  --   --  0.5  --   GFRNONAA 19*  < > 16* 15* 15*  GFRAA 22*  < > 18* 17* 17*  ANIONGAP 6  < > 9 8 12   < > =  values in this interval not displayed.   Hematology  Recent Labs Lab 06/25/16 0409 06/26/16 0320 06/27/16 0317  WBC 6.7 8.5 7.6  RBC 3.77* 3.84* 3.86*  HGB 10.9* 11.0* 11.1*  HCT 34.2* 34.8* 35.1*  MCV 90.7 90.6 90.9  MCH 28.9 28.6 28.8  MCHC 31.9 31.6 31.6  RDW 16.1* 15.9* 16.0*  PLT 222 221 209    Cardiac Enzymes  Recent Labs Lab 07/19/2016 1841 06/24/16 0005 06/24/16 0904  TROPONINI 0.04* 0.05* 0.04*   No results for input(s): TROPIPOC in the last 168 hours.   BNPNo results for input(s): BNP, PROBNP in the last 168 hours.   DDimer No results for input(s): DDIMER in the last 168 hours.   Radiology    US Renal  Result Date: 06/25/2016 CLINICAL DATA:  Acute renal failure. EXAM: RENAL / URINARY TRACT ULTRASOUND COMPLETE COMPARISON:  None. FINDINGS: Right Kidney: Length: 8.6 cm. Echogenicity within normal limits. No mass or hydronephrosis visualized. Left Kidney: Length: 10.8 cm. Echogenicity within normal limits. No mass or hydronephrosis visualized. Bladder: Decompressed and therefore not well visualized. IMPRESSION: Mild right renal atrophy. No hydronephrosis or renal obstruction is noted. Electronically Signed   By: Marijo Conception, M.D.   On: 06/25/2016 08:57    Cardiac Studies   Echo 06/24/16: Left ventricle: The cavity size was normal. Wall thickness was   increased in a pattern of mild LVH. Systolic  function was normal.   The estimated ejection fraction was in the range of 60% to 65%.   Wall motion was normal; there were no regional wall motion   abnormalities. The study is not technically sufficient to allow   evaluation of LV diastolic function. - Aortic valve: Trileaflet. Sclerosis without stenosis. There was   no regurgitation. - Mitral valve: Mildly thickened leaflets . There was trivial   regurgitation. - Left atrium: Moderately dilated. - Right ventricle: The cavity size was moderately dilated. Systolic   function is mildly reduced. - Tricuspid valve: There was moderate regurgitation. - Pulmonary arteries: PA peak pressure: 39 mm Hg (S). - Inferior vena cava: The vessel was dilated. The respirophasic   diameter changes were blunted (< 50%), consistent with elevated   central venous pressure.  Impressions:  - Compared to a prior study in 2016, the LVEF is stable at 60-65%.   There is now moderate LAE and severe RAE, moderate TR with a   lower RVSP at 39 mmHg, the IVC is dilated. Rhythm is noted to be   atrial fibrillation.  Patient Profile     83 y.o. female with history of chronic diastolic CHF, chronic LE edema. Chronic kidney disease, chronic DVT on Xarelto admitted with leg swelling and weakness and found to be in atrial fib with RVR  Assessment & Plan    1. Acute on chronic diastolic CHF: Normal LV systolic function but possible mild RV dysfunction. Likely exacerbated by new onset atrial fib. Currently on IV Lasix 60 BID and Midodrine 5 mg TID. Worsened renal function - SCr 2.6 and BUN 58 today. However, she continues to be volume overloaded. Net +0.7 L in the past 24 hrs. BP 0000000 systolic and [id number] diastolic. Will continue IV diuresis today.   2. Atrial fib with RVR: Rate is controlled this am on amiodarone drip. Unable to add in beta blocker due to hypotension. CHADS VASC 5. She had been on Xarelto but now on hold with renal insufficiency. She may need  coumadin at time of discharge if renal function does  not improve. Will hold off on cardioversion as her rate is controlled. She is now on IV heparin.   Signed, Shela Leff, MD  06/27/2016, 7:35 AM    I have personally seen and examined this patient with Dr. Marlowe Sax. I agree with the assessment and plan as outlined above. She is still volume overloaded. She is not diuresing well with IV Lasix. It may be that the atrial fibrillation is contributing to this. She is rate controlled. Can consider DCCV. Nephrology is following. Exam with +JVD, crackles lung bases, LE edema.  I will not add milrinone given relative hypotension. Will not add dopamine with atrial fib. For now, continue IV Lasix and midodrine.  I will ask the CHF team to see her today to review options.   Lauree Chandler 06/27/2016 10:36 AM

## 2016-06-27 NOTE — Progress Notes (Addendum)
Patient ID: Christina Martin, female   DOB: November 05, 1924, 83 y.o.   MRN: [id number]  PROGRESS NOTE    Christina Martin  [id number] DOB: [birth year] DOA: 07/12/2016  PCP: [name], MD   Brief Narrative:  83 y.o.femalewith medical history significant for chronic diastolic CHF (last 2 D ECHO in 03/2015 showed EF 65% and grade 1 DD), CKD stage 4 with baseline creatinine 1.80 back in 03/2015, hypertension, GERD. She presented to [facility] with worsening lower extremity swelling and difficulty ambulating over past 2 weeks prior to this admission. She also had shortness of breath with exertion. She took extra dose of lasix for past 1 week without significant symptomatic relief.   Pt was hemodynamically stable. Her blood work showed potassium of 5.2, Cr 2.15, troponin 0.04. The 12 lead EKG showed atrial fibrillation. She was started on IV lasix 60 mg IV Q 12 hours. Cardio seen in consultation. Due to renal insufficiency nephrology has been consulted as well.    Assessment & Plan:   Lower extremity edema /Acute on chronic diastolic heart failure (HCC) - ECHO with a EF of 60-65%, no wall motion abnormalities, moderate left atrial enlargement and severe RAE, moderate TR IVC dilated. - Started lasix 60 mg IV Q 12 hours but her BP was on soft side so then started milrinone so that it can support BP and allow adequate diuresis  - Cardio increased lasix to 100 mg IV Q 8 hours today     New onset atrial fibrillation - CHADS vasc score 5 - Continue amiodarone drip - Initially on xarelto but this was placed on hold due to renal insufficiency so she is on heparin drip - Plan for cardioversion tomorrow     Hx of Left leg DVT 08-2014 - Decreased the dose to 10 mg a day by hematology and she was supposed to be on it through 08/2016.  - Xarelto on hold due to renal insufficiency  - On heparin drip  - Lower extremity Doppler negative for DVT.  Essential hypertension - Lisinopril on hold due  to renal insufficiency  - On IV lasix, midodrine   Hyperkalemia - Due to supplementation and worsening renal function. - Potassium corrected with lasix   Elevated troponin - Likely demand ischemia due to CKD, acute CHF - Troponin 0.04, 0.05 and 0.04 - No chest pain  GERD without esophagitis - Continue protonix   Acute renal failure superimposed on stage 4 chronic kidney disease (HCC) - Cr baseline is 1.6 on 02/2016 and on this admission 2.15 which has worsened to 2.68 this am - Renal ultrasound with mild right renal atrophy. No hydronephrosis or renal obstruction noted. - Per renal, likely low perfusion in setting of ACEi, AIN - Continue to monitor renal function now that lasix dose increased to 100 mg Q 8 hours IV  Anemia of chronic kidney disease - Hgb drop from 12.1 to 10.6 - No evidence of gross bleed - Hgb this am 11.1  - Monitor CBC daily as pt on heparin drip    DVT prophylaxis: On heparin drip  Code Status: DNR/DNI Family Communication: Family at the bedside this am Disposition Plan: Home versus SNF once cleared by renal and cardio; still on IV lasix     Consultants:   Cardiology: Dr Debara Pickett 06/24/2016  Renal  Procedures:   Renal ultrasound 06/25/2016  Lower extremity dopplers 06/24/2016  2-D echo 06/24/2016 - EF 60%  Antimicrobials:   None   Subjective: No overnight events.  Objective: Vitals:   06/26/16 2338 06/27/16 0338 06/27/16 0901 06/27/16 1241  BP: (!) 100/58 (!) 99/53 (!) 104/58 (!) 95/49  Pulse: 92 97 (!) 102 92  Resp: 20 14 18 18   Temp: 97.9 F (36.6 C) 97.9 F (36.6 C) 97.7 F (36.5 C) 98.3 F (36.8 C)  TempSrc: Oral Oral Axillary Oral  SpO2: 94% 95% 92% 99%  Weight:  113.2 kg (249 lb 9 oz)    Height:        Intake/Output Summary (Last 24 hours) at 06/27/16 1523 Last data filed at 06/27/16 0400  Gross per 24 hour  Intake            798.5 ml  Output              300 ml  Net            498.5 ml   Filed  Weights   06/25/16 0400 06/26/16 0326 06/27/16 0338  Weight: 111.1 kg (244 lb 14.9 oz) 111.3 kg (245 lb 6 oz) 113.2 kg (249 lb 9 oz)    Examination:  General exam: Appears calm and comfortable, no distress  Respiratory system: No wheezing, no rhonchi  Cardiovascular system: S1 & S2 heard, slight tachycardia  Gastrointestinal system: A(+) BS, non tender abd  Central nervous system: No focal neurological deficits. Extremities: +3 LE pitting edema, redness of bilateral extremities with chronic venous skin changes  Skin: Skin is warm and dry  Psychiatry: Normal mood and behavior   Data Reviewed: I have personally reviewed following labs and imaging studies  CBC:  Recent Labs Lab 07/03/2016 1841 06/24/16 0904 06/25/16 0409 06/25/16 1412 06/26/16 0320 06/27/16 0317  WBC 7.1 6.6 6.7  --  8.5 7.6  NEUTROABS  --   --   --  4.3  --   --   HGB 12.1 10.6* 10.9*  --  11.0* 11.1*  HCT 36.7 33.0* 34.2*  --  34.8* 35.1*  MCV 88.9 89.7 90.7  --  90.6 90.9  PLT 230 212 222  --  221 [id number]   Basic Metabolic Panel:  Recent Labs Lab 07/16/2016 1841 06/24/16 0904 06/25/16 0409 06/25/16 0808 06/25/16 1256 06/26/16 0320 06/27/16 0317  NA 136 136 137  --  137 136 136  K 5.2* 4.7 5.4* 5.5* 4.8 4.4 4.5  CL 101 103 101  --  99* 99* 95*  CO2 29 28 30   --  29 29 29   GLUCOSE 141* 131* 97  --  111* 102* 112*  BUN 43* 43* 49*  --  50* 51* 58*  CREATININE 2.15* 2.24* 2.53*  --  2.54* 2.65* 2.68*  CALCIUM 9.8 9.3 9.4  --  9.5 8.9 9.3  MG 2.3  --   --   --   --  2.1  --   PHOS 2.8  --   --   --   --  4.6  --    GFR: Estimated Creatinine Clearance: 16.9 mL/min (by C-G formula based on SCr of 2.68 mg/dL (H)). Liver Function Tests:  Recent Labs Lab 06/27/2016 1841 06/26/16 0320  AST 26 32  ALT 22 26  ALKPHOS 83 86  BILITOT 0.7 0.5  PROT 6.3* 5.9*  ALBUMIN 3.2* 2.9*   No results for input(s): LIPASE, AMYLASE in the last 168 hours. No results for input(s): AMMONIA in the last 168  hours. Coagulation Profile:  Recent Labs Lab 07/14/2016 1841  INR 1.13   Cardiac Enzymes:  Recent Labs Lab 06/28/2016 1841  06/24/16 0005 06/24/16 0904  TROPONINI 0.04* 0.05* 0.04*   BNP (last 3 results) No results for input(s): PROBNP in the last 8760 hours. HbA1C: No results for input(s): HGBA1C in the last 72 hours. CBG:  Recent Labs Lab 07/03/2016 2135 06/25/16 2158 06/26/16 0825  GLUCAP 143* 114* 99   Lipid Profile: No results for input(s): CHOL, HDL, LDLCALC, TRIG, CHOLHDL, LDLDIRECT in the last 72 hours. Thyroid Function Tests: No results for input(s): TSH, T4TOTAL, FREET4, T3FREE, THYROIDAB in the last 72 hours. Anemia Panel:  Recent Labs  06/25/16 1412  TIBC 364  IRON 48   Urine analysis:    Component Value Date/Time   COLORURINE YELLOW 06/25/2016 1834   APPEARANCEUR CLOUDY (A) 07/10/2016 1834   LABSPEC 1.013 07/07/2016 1834   PHURINE 5.0 07/03/2016 1834   GLUCOSEU NEGATIVE 06/22/2016 1834   HGBUR SMALL (A) 07/19/2016 1834   BILIRUBINUR NEGATIVE 06/27/2016 1834   KETONESUR NEGATIVE 07/05/2016 1834   PROTEINUR NEGATIVE 07/08/2016 1834   NITRITE NEGATIVE 06/26/2016 1834   LEUKOCYTESUR LARGE (A) 07/08/2016 1834   Sepsis Labs: @LABRCNTIP (procalcitonin:4,lacticidven:4)    Urine culture     Status: Abnormal   Collection Time: 06/25/16  8:16 AM  Result Value Ref Range Status   Specimen Description URINE, SUPRAPUBIC  Final   Special Requests NONE  Final   Culture MULTIPLE SPECIES PRESENT, SUGGEST RECOLLECTION (A)  Final   Report Status 06/26/2016 FINAL  Final  MRSA PCR Screening     Status: None   Collection Time: 06/25/16  5:07 PM  Result Value Ref Range Status   MRSA by PCR NEGATIVE NEGATIVE Final      Radiology Studies: US Renal Result Date: 06/25/2016 Mild right renal atrophy. No hydronephrosis or renal obstruction is noted. Electronically Signed   By: Marijo Conception, M.D.   On: 06/25/2016 08:57     Scheduled Meds: . docusate sodium   100 mg Oral Daily  . feeding supplement   237 mL Oral BID BM  . furosemide  100 mg Intravenous Q8H  . midodrine  5 mg Oral TID WC  . multivitamin with min  1 tablet Oral Daily  . pantoprazole  40 mg Oral Daily   Continuous Infusions: . amiodarone 30 mg/hr (06/27/16 1044)  . heparin 750 Units/hr (06/26/16 1600)     LOS: 4 days    Time spent: 25 minutes  Greater than 50% of the time spent on counseling and coordinating the care.   Leisa Lenz, MD [facility] Pager [phone]  If 7PM-7AM, please contact night-coverage [email or web address] Password [email or web address] 06/27/2016, 3:23 PM

## 2016-06-27 NOTE — Progress Notes (Signed)
[place] for heparin  Indication: atrial fibrillation  Allergies  Allergen Reactions  . Lithostat [Acetohydroxamic Acid] Other (See Comments)    Unusual tiredness, weakness, nervousness, mental confusion, sore throat, Poor appetite   Patient Measurements: Height: 5\' 2"  (157.5 cm) Weight: 249 lb 9 oz (113.2 kg) IBW/kg (Calculated) : 50.1 Heparin Dosing Weight: 77  Vital Signs: Temp: 97.9 F (36.6 C) (02/06 0338) Temp Source: Oral (02/06 0338) BP: 99/53 (02/06 0338) Pulse Rate: 97 (02/06 0338)  Labs:  Recent Labs  06/24/16 0904 06/25/16 0409 06/25/16 1256  06/26/16 0320 06/26/16 1429 06/26/16 2328 06/27/16 0317  HGB 10.6* 10.9*  --   --  11.0*  --   --  11.1*  HCT 33.0* 34.2*  --   --  34.8*  --   --  35.1*  PLT 212 222  --   --  221  --   --  209  HEPARINUNFRC  --   --   --   < > 0.97* 0.84* 0.54 0.37  CREATININE 2.24* 2.53* 2.54*  --  2.65*  --   --  2.68*  TROPONINI 0.04*  --   --   --   --   --   --   --   < > = values in this interval not displayed.  Estimated Creatinine Clearance: 16.9 mL/min (by C-G formula based on SCr of 2.68 mg/dL (H)).   Marland Kitchen amiodarone 30 mg/hr (06/26/16 2350)  . heparin 750 Units/hr (06/26/16 1600)     Assessment: 83 y.o. female with new afib, on chronic Xarelto for hx DVT.  Currently Xarelto on hold for renal dysfunction, pharmacy dosing IV heparin.  Heparin level remains at goal this morning on 750 units/hr.  No bleeding or complications noted, CBC stable.  Goal of Therapy:  Heparin level 0.3-0.7 units/ml Monitor platelets by anticoagulation protocol: Yes   Plan:  Continue Heparin at current rate. Daily heparin level and CBC. F/u plans to resume oral anticoagulation eventually.  Uvaldo Rising, BCPS  Clinical Pharmacist Pager [phone]  06/27/2016 8:09 AM

## 2016-06-27 NOTE — Progress Notes (Signed)
Subjective: Interval History: No complaints. Reports noticing decreased UOP for several weeks. Denies lightheadedness or dizziness.   Objective: Vital signs in last 24 hours: Temp:  [97.7 F (36.5 C)-98.3 F (36.8 C)] 97.9 F (36.6 C) (02/06 0338) Pulse Rate:  [64-97] 97 (02/06 0338) Resp:  [14-25] 14 (02/06 0338) BP: (83-105)/(46-58) 99/53 (02/06 0338) SpO2:  [94 %-100 %] 95 % (02/06 0338) Weight:  [249 lb 9 oz (113.2 kg)] 249 lb 9 oz (113.2 kg) (02/06 0338) Weight change: 4 lb 3 oz (1.9 kg)  Intake/Output from previous day: 02/05 0701 - 02/06 0700 In: 1020.5 [P.O.:250; I.V.:770.5] Out: 300 [Urine:300] Intake/Output this shift: No intake/output data recorded.  Gen: pleasant female, lying in bed, NAD  CV: Irregularly irregular. No murmurs appreciated.  Lungs: Faint bibasilar crackles. Normal WOB. DISH in place.  GI: soft, NTND  Ext: 2-3+ bilateral LE edema with overlying tight skin, venous stasis changes present of skin    Lab Results:  Recent Labs  06/26/16 0320 06/27/16 0317  WBC 8.5 7.6  HGB 11.0* 11.1*  HCT 34.8* 35.1*  PLT 221 209   BMET:   Recent Labs  06/26/16 0320 06/27/16 0317  NA 136 136  K 4.4 4.5  CL 99* 95*  CO2 29 29  GLUCOSE 102* 112*  BUN 51* 58*  CREATININE 2.65* 2.68*  CALCIUM 8.9 9.3    Recent Labs  06/25/16 1412  PTH 41   Iron Studies:   Recent Labs  06/25/16 1412  IRON 48  TIBC 364   Studies/Results: No results found.  I have reviewed the patient's current medications.  Assessment/Plan: Christina Martin is 83 y.o. female with PMH of chronic diastolic CHF, CKD4, neurogenic bladder with SPC in place,  And chronic DVT on Xarelto who was admitted with lower extremity swelling and weakness. Patient was found to be in Afib with RVR. She has received diuresis for an acute exacerbation of CHF. Found to have an AKI at admission that worsened, so nephrology became involved in her care.   1. AKI: Baseline Cr 1.6-1.8.  Cr slightly  worsened today from 2.5 to 2.68.  Patient remains volume overloaded. Suspect low perfusion given persistent hypotension complicated by use of AceI at home. No urine eosinophils present so unlikely AIN. Agree with continued diuresis as BP can tolerate. Will increase Lasix to 100 mg q8h given increase in weight from yesterday and poor UOP. Will obtain CXR to further evaluate volume status.  2. Hypotension: Requiring Midodrine. Potential barrier to aggressive diuresis.  3. Anemia: With low-normal Fe and saturation ratio of 13%. Would give Feraheme.  4. AFib with RVR: Rate controlled with amiodarone drip. IV heparin.  5. Acute CHF exacerbation: With volume excess. Diuresis.    LOS: 4 days   [name] 06/27/2016,8:53 AM    Renal Attending: I agree with note above.  The low BP is biggest barrier to treatment but will attempt more aggressive diuresis.  She has limited renal reserve with an atrophic kidney. Chihiro Frey C

## 2016-06-27 NOTE — Progress Notes (Signed)
[place] for heparin  Indication: atrial fibrillation  Allergies  Allergen Reactions  . Lithostat [Acetohydroxamic Acid] Other (See Comments)    Unusual tiredness, weakness, nervousness, mental confusion, sore throat, Poor appetite   Patient Measurements: Height: 5\' 2"  (157.5 cm) Weight: 245 lb 6 oz (111.3 kg) IBW/kg (Calculated) : 50.1 Heparin Dosing Weight: 77  Vital Signs: Temp: 97.9 F (36.6 C) (02/05 2338) Temp Source: Oral (02/05 2338) BP: 100/58 (02/05 2338) Pulse Rate: 92 (02/05 2338)  Labs:  Recent Labs  06/24/16 0904 06/25/16 0409 06/25/16 1256  06/26/16 0320 06/26/16 1429 06/26/16 2328  HGB 10.6* 10.9*  --   --  11.0*  --   --   HCT 33.0* 34.2*  --   --  34.8*  --   --   PLT 212 222  --   --  221  --   --   HEPARINUNFRC  --   --   --   < > 0.97* 0.84* 0.54  CREATININE 2.24* 2.53* 2.54*  --  2.65*  --   --   TROPONINI 0.04*  --   --   --   --   --   --   < > = values in this interval not displayed.  Estimated Creatinine Clearance: 16.9 mL/min (by C-G formula based on SCr of 2.65 mg/dL (H)).  Assessment: 83 y.o. female with Afib, Xarelto on hold, for heparin.  Goal of Therapy:  Heparin level 0.3-0.7 units/ml Monitor platelets by anticoagulation protocol: Yes   Plan:  Continue Heparin at current rate   Phillis Knack, PharmD, BCPS  06/27/16 12:36 AM

## 2016-06-27 NOTE — Care Management Note (Signed)
Case Management Note  Patient Details  Name: JULIEN BEERE MRN: [id number] Date of Birth: June 07, 1924  Subjective/Objective:    Adm w le edema, at fib, chf                Action/Plan:lives at home pta  Expected Discharge Date:                  Expected Discharge Plan:  [place]  In-House Referral:     Discharge planning Services     Post Acute Care Choice:    Choice offered to:     DME Arranged:    DME Agency:     HH Arranged:    [place] Agency:     Status of Service:  In process, will continue to follow  If discussed at Long Length of Stay Meetings, dates discussed:    Additional Comments: will moniter for dc needs as pt progresses.  Lacretia Leigh, RN 06/27/2016, 9:18 AM

## 2016-06-27 NOTE — Consult Note (Signed)
Advanced Heart Failure Team Consult Note  Referring Physician: Dr. Angelena Form Primary Physician: Dr. Charlies Silvers Primary Cardiologist:  Dr. Angelena Form  Reason for Consultation: Acute on chronic diastolic CHF.   HPI:    [name] is a 83 year old female with a past medical history of chronic diastolic CHF, CKD stage IV (baseline creatinine of 1.5 - 1.6), history of DVT (on Xarelto). She presented to [facility] ED on 07/12/2016 with bilateral lower extremity edema and dyspnea on exertion.   Admitted and diuresed with 60mg  Lasix IV BID, although some doses were held due to systolic BP's in the [id number].  She was started on midodrine 5mg  TID on 06/25/16.  Creatinine on admission was 2.15, and has risen to 2.6 today. ACE-I (benazepril was discontinued at admission).   She developed new onset rapid atrial fibrillation on 06/24/16, subsequently started on Amiodarone gtt. Renal saw her on 06/25/16 and suggested holding off on diuretics and continue to hold ACE-I as it was felt her acute on chronic CKD was due to decreased renal perfusion from hypotension.   Now, volume overloaded on exam, but with rising creatinine. +720 ml today and overall positive for admission. Remains hypotensive with SBP in the 90-100's. Echo this admission shows EF of 123456, diastolic dysfunction not technically sufficient enough to evaluate diastolic dysfunction. Moderate LAE and severe RAE.   She is mostly inactive at home, says she sits in her recliner most days, does not feel SOB at rest. Can walk to the bathroom, does get SOB with minimal activity. Her diet consists of mostly home cooked meals, does not eat out much, does not add salt to her food and avoids canned foods.     Review of Systems: [y] = yes, [ ]  = no   General: Weight gain [ ] ; Weight loss [ ] ; Anorexia [ ] ; Fatigue [ y]; Fever [ ] ; Chills [ ] ; Weakness Blue.Reese ]  Cardiac: Chest pain/pressure [ ] ; Resting SOB [n ]; Exertional SOB Blue.Reese ]; Orthopnea [ ] ; Pedal Edema Blue.Reese ];  Palpitations [n ]; Syncope [n ]; Presyncope [ ] ; Paroxysmal nocturnal dyspnea[ ]   Pulmonary: Cough [ ] ; Wheezing[n ]; Hemoptysis[ ] ; Sputum [ ] ; Snoring [ ]   GI: Vomiting[ ] ; Dysphagia[ ] ; Melena[ ] ; Hematochezia [ ] ; Heartburn[ ] ; Abdominal pain [ ] ; Constipation [ ] ; Diarrhea [ ] ; BRBPR [ ]   GU: Hematuria[ ] ; Dysuria [ ] ; Nocturia[ ]   Vascular: Pain in legs with walking [ y]; Pain in feet with lying flat [ ] ; Non-healing sores [ ] ; Stroke [ ] ; TIA [ ] ; Slurred speech [ ] ;  Neuro: Headaches[ ] ; Vertigo[ ] ; Seizures[ ] ; Paresthesias[ ] ;Blurred vision [ ] ; Diplopia [ ] ; Vision changes [ ]   Ortho/Skin: Arthritis [ ] ; Joint pain [ ] ; Muscle pain [ ] ; Joint swelling [ ] ; Back Pain [ ] ; Rash [ ]   Psych: Depression[ ] ; Anxiety[ ]   Heme: Bleeding problems [ ] ; Clotting disorders [ ] ; Anemia [ ]   Endocrine: Diabetes [ ] ; Thyroid dysfunction[ ]   Home Medications Prior to Admission medications   Medication Sig Start Date End Date Taking? Authorizing Provider  acetaminophen (TYLENOL) 500 MG tablet Take 1,000 mg by mouth every 6 (six) hours as needed (pain).   Yes Historical Provider, MD  benazepril (LOTENSIN) 40 MG tablet Take 1 tablet (40 mg total) by mouth daily. 03/13/16  Yes Colon Branch, MD  Calcium Carbonate-Vitamin D (CALCIUM 600 + D PO) Take 1 tablet by mouth daily. \   Yes Historical  Provider, MD  Docusate Calcium (STOOL SOFTENER PO) Take 1 capsule by mouth daily.    Yes Historical Provider, MD  furosemide (LASIX) 80 MG tablet Take 1 tablet (80 mg total) by mouth 2 (two) times daily. 05/09/16  Yes Burnell Blanks, MD  hydrocortisone cream 1 % Apply 1 application topically daily as needed for itching (redness/ irritation).   Yes Historical Provider, MD  Methylcellulose, Laxative, (CITRUCEL PO) Take 2 tablets by mouth daily at 12 noon. Reported on 07/09/2015   Yes Historical Provider, MD  Multiple Vitamin (MULTIVITAMIN WITH MINERALS) TABS tablet Take 1 tablet by mouth daily.   Yes Historical  Provider, MD  Omega-3 Fatty Acids (FISH OIL) 1200 MG CAPS Take 1,200 mg by mouth daily.    Yes Historical Provider, MD  omeprazole (PRILOSEC OTC) 20 MG tablet Take 20 mg by mouth 4 (four) times a week. Sunday, Tuesday, Thursday, Saturday   Yes Historical Provider, MD  potassium chloride SA (KLOR-CON M20) 20 MEQ tablet Take 1 tablet (20 mEq total) by mouth daily. 05/09/16  Yes Burnell Blanks, MD  sodium chloride irrigation 0.9 % irrigation 70 mLs See admin instructions. Use 70 ml to flush catheter daily 05/17/16  Yes Historical Provider, MD  traMADol (ULTRAM) 50 MG tablet Take 1 tablet (50 mg total) by mouth 3 (three) times daily as needed. Patient taking differently: Take 50 mg by mouth 2 (two) times daily.  05/23/16  Yes Colon Branch, MD  VITAMIN E PO Take 1 capsule by mouth daily.   Yes Historical Provider, MD  XARELTO 20 MG TABS tablet TAKE 1 TABLET (20 MG TOTAL) BY MOUTH DAILY WITH SUPPER. Patient taking differently: TAKE 1/2 TABLET (10 MG) BY MOUTH DAILY WITH SUPPER (UNTIL OFFICE VISIT APRIL 2018) 06/12/16  Yes Volanda Napoleon, MD  augmented betamethasone dipropionate (DIPROLENE-AF) 0.05 % cream Apply topically 2 (two) times daily. Patient not taking: Reported on 07/08/2016 09/01/15   Colon Branch, MD  lactulose [facility]) 10 GM/15ML solution Take 15 mLs (10 g total) by mouth daily as needed for mild constipation. Patient not taking: Reported on 07/14/2016 09/01/14   Colon Branch, MD    Past Medical History: Past Medical History:  Diagnosis Date  . ATONIC BLADDER-- has a permananet suprapubic catheter    . Calculus in urethra    Dr. Diona Fanti  . Diastolic dysfunction, left ventricle 05/30/2013   05/2013 see ECHO   . DJD (degenerative joint disease)   . GERD (gastroesophageal reflux disease)   . HTN (hypertension) 12/30/2007  . Nonspecific elevation of levels of transaminase or lactic acid dehydrogenase (LDH) 1996   + Hepatitis srology  . Prediabetes 04/07/2014  . Uterine cancer  (h/O)     no recent f/u     Past Surgical History: Past Surgical History:  Procedure Laterality Date  . APPENDECTOMY    . cataract surgery  2014   bilateral; Dr Prudencio Burly  . COLONOSCOPY W/ POLYPECTOMY  1998   Tics in 2006; Dr Sharlett Iles  . CYSTOSCOPY  02/28/2016   3-4 stones approximately 1cm located in posterior bladder, no tumors seen  . ESOPHAGEAL DILATION  2006  . POSTERIOR LAMINECTOMY / DECOMPRESSION LUMBAR SPINE  2009   Dr Lorin Mercy  . SBO  1991   adhesions resected  . TOTAL ABDOMINAL HYSTERECTOMY W/ BILATERAL SALPINGOOPHORECTOMY  1990   uterine cancer  . TOTAL KNEE ARTHROPLASTY  1996 & 2002    Family History: Family History  Problem Relation Age of Onset  . Colon  cancer Mother   . Diabetes Mother     mother, brother  . Stroke Mother     > 39  . Liver disease Sister     non hepatitis  . Heart failure Sister   . CAD Brother     brother, mother , sister  . Breast cancer Neg Hx     Social History: Social History   Social History  . Marital status: Widowed    Spouse name: N/A  . Number of children: 3  . Years of education: N/A   Occupational History  . Retired-office work    Social History Main Topics  . Smoking status: Former Smoker    Quit date: 05/22/1986  . Smokeless tobacco: Never Used     Comment: smoked 1944-1988 , up to 1 ppd  . Alcohol use No  . Drug use: No  . Sexual activity: Not on file   Other Topics Concern  . Not on file   Social History Narrative   Lives by herself, doesn't drive, family in the area       Allergies:  Allergies  Allergen Reactions  . Lithostat [Acetohydroxamic Acid] Other (See Comments)    Unusual tiredness, weakness, nervousness, mental confusion, sore throat, Poor appetite    Objective:    Vital Signs:   Temp:  [97.7 F (36.5 C)-98.3 F (36.8 C)] 97.7 F (36.5 C) (02/06 0901) Pulse Rate:  [64-102] 102 (02/06 0901) Resp:  [14-25] 18 (02/06 0901) BP: (83-105)/(46-58) 104/58 (02/06 0901) SpO2:  [92 %-100 %] 92 % (02/06  0901) Weight:  [249 lb 9 oz (113.2 kg)] 249 lb 9 oz (113.2 kg) (02/06 0338) Last BM Date: 06/25/16  Weight change: Filed Weights   06/25/16 0400 06/26/16 0326 06/27/16 0338  Weight: 244 lb 14.9 oz (111.1 kg) 245 lb 6 oz (111.3 kg) 249 lb 9 oz (113.2 kg)    Intake/Output:   Intake/Output Summary (Last 24 hours) at 06/27/16 1035 Last data filed at 06/27/16 0400  Gross per 24 hour  Intake            843.5 ml  Output              300 ml  Net            543.5 ml     Physical Exam: General:  Elderly woman. Lying in bed. No resp difficulty HEENT: normal Neck: supple. + jaw JVP . Carotids 2+ bilat; no bruits. No lymphadenopathy or thyromegaly appreciated. Cor: PMI nondisplaced. Irregular rate & rhythm. No rubs, gallops or murmurs. Lungs: coarse rales in bilateral lower lobes.  Abdomen: obese soft, nontender, nondistended. No obvious hepatosplenomegaly. No bruits or masses. Good bowel sounds. Extremities: no cyanosis, clubbing, rash. 2+ pretibial edema Neuro: alert & orientedx3, cranial nerves grossly intact. moves all 4 extremities w/o difficulty. Affect pleasant  Telemetry: Afib, rates in the 90-100's  Labs: Basic Metabolic Panel:  Recent Labs Lab 06/25/2016 1841 06/24/16 0904 06/25/16 0409 06/25/16 0808 06/25/16 1256 06/26/16 0320 06/27/16 0317  NA 136 136 137  --  137 136 136  K 5.2* 4.7 5.4* 5.5* 4.8 4.4 4.5  CL 101 103 101  --  99* 99* 95*  CO2 29 28 30   --  29 29 29   GLUCOSE 141* 131* 97  --  111* 102* 112*  BUN 43* 43* 49*  --  50* 51* 58*  CREATININE 2.15* 2.24* 2.53*  --  2.54* 2.65* 2.68*  CALCIUM 9.8 9.3 9.4  --  9.5  8.9 9.3  MG 2.3  --   --   --   --  2.1  --   PHOS 2.8  --   --   --   --  4.6  --     Liver Function Tests:  Recent Labs Lab 07/03/2016 1841 06/26/16 0320  AST 26 32  ALT 22 26  ALKPHOS 83 86  BILITOT 0.7 0.5  PROT 6.3* 5.9*  ALBUMIN 3.2* 2.9*    CBC:  Recent Labs Lab 07/08/2016 1841 06/24/16 0904 06/25/16 0409 06/25/16 1412  06/26/16 0320 06/27/16 0317  WBC 7.1 6.6 6.7  --  8.5 7.6  NEUTROABS  --   --   --  4.3  --   --   HGB 12.1 10.6* 10.9*  --  11.0* 11.1*  HCT 36.7 33.0* 34.2*  --  34.8* 35.1*  MCV 88.9 89.7 90.7  --  90.6 90.9  PLT 230 212 222  --  221 209    Cardiac Enzymes:  Recent Labs Lab 07/07/2016 1841 06/24/16 0005 06/24/16 0904  TROPONINI 0.04* 0.05* 0.04*     CBG:  Recent Labs Lab 07/14/2016 2135 06/25/16 2158 06/26/16 0825  GLUCAP 143* 114* 99     Other results: EKG: Afib, rates 113.  Imaging:  No results found.   Medications:     Current Medications: . docusate sodium  100 mg Oral Daily  . feeding supplement (ENSURE ENLIVE)  237 mL Oral BID BM  . furosemide  60 mg Intravenous BID  . midodrine  5 mg Oral TID WC  . multivitamin with minerals  1 tablet Oral Daily  . pantoprazole  40 mg Oral Daily  . sodium chloride flush  3 mL Intravenous Q12H  . sodium chloride irrigation  70 mL Irrigation See admin instructions     Infusions: . amiodarone 30 mg/hr (06/26/16 2350)  . heparin 750 Units/hr (06/26/16 1600)      Assessment/Plan   1. Acute on chronic diastolic CHF, NYHA class III.    This patients CHA2DS2-VASc Score is 7.  2. Acute on chronic CKD stage IV 3. New onset atrial fibrillation with RVR 4. History of DVT on chronic anticoagulation 5. Morbid obesity   Ms. Czajka is volume overloaded on exam, with + JVD and bilateral LE edema (although some of it does appear chronic). Diuresis has been limited by both hypotension and her worsening CKD. Not responding to IV diuresis, +700 today with minimal urine output. Her weight in Nov. Was 237 lbs, now up to 249 pounds.   On IV Lasix 100mg  q 8 hours per renal. Consider adding metolazone 2.5mg  as well, depending on how she responds to increased Lasix. Of note, her home lasix dose is 80mg  BID.   Ideally would like to start dopamine to increase renal perfusion, however with new onset atrial fibrillation this  admission there is risk of putting her back in rapid Afib. Would hold off on starting milrinone as she is hypotensive, and she has new onset atrial arrhythmia.   Will plan for TEE/DCCV tomorrow as well, she is on heparin. Plan to transition to warfarin instead on DOAC due to CrCl < 25.    Length of Stay: [place], NP  06/27/2016, 10:35 AM  Advanced Heart Failure Team Pager [phone] (M-F; 7a - 4p)  Please contact [facility] for night-coverage after hours (4p -7a ) and weekends on [email or web address]  Patient seen and examined with Jettie Booze, NP. We discussed all aspects of  the encounter. I agree with the assessment and plan as stated above.   83 y/o functional woman as above with morbid obesity, HTN, prediabetes, CKD and diastolic HF admitted with volume overload and AKI in setting of new onset AF. Diuresis has been sluggish and Renal now following. EF low normal on echo. Suspect AF has triggered decompensation in setting of advanced diastolic dysfunction and CKD IV. She is now rate controlled on amio. Xarelto has been switched to heparin.warfarin due to CrCl < 20.   Will plan attempt at restoring NSR with TEE/DC-CV as I suspect she will not tolerate AF well. Continue amio and IV lasix. We discussed TEE/DC-CV in detail. Hopefully restoration of NSR with facilitate diuresis. Given her body habitus will need to consider outpatient sleep study.   Bensimhon, Daniel,MD 11:14 PM

## 2016-06-28 ENCOUNTER — Inpatient Hospital Stay (HOSPITAL_COMMUNITY): Payer: Medicare Other | Admitting: Anesthesiology

## 2016-06-28 ENCOUNTER — Inpatient Hospital Stay (HOSPITAL_COMMUNITY): Payer: Medicare Other

## 2016-06-28 ENCOUNTER — Encounter (HOSPITAL_COMMUNITY): Payer: Self-pay

## 2016-06-28 ENCOUNTER — Encounter (HOSPITAL_COMMUNITY): Admission: AD | Disposition: E | Payer: Self-pay | Source: Ambulatory Visit | Attending: Internal Medicine

## 2016-06-28 DIAGNOSIS — I4891 Unspecified atrial fibrillation: Secondary | ICD-10-CM

## 2016-06-28 DIAGNOSIS — I340 Nonrheumatic mitral (valve) insufficiency: Secondary | ICD-10-CM

## 2016-06-28 DIAGNOSIS — I5032 Chronic diastolic (congestive) heart failure: Secondary | ICD-10-CM

## 2016-06-28 HISTORY — PX: CARDIOVERSION: SHX1299

## 2016-06-28 HISTORY — PX: TEE WITHOUT CARDIOVERSION: SHX5443

## 2016-06-28 LAB — BASIC METABOLIC PANEL
Anion gap: 12 (ref 5–15)
BUN: 57 mg/dL — ABNORMAL HIGH (ref 6–20)
CHLORIDE: 96 mmol/L — AB (ref 101–111)
CO2: 28 mmol/L (ref 22–32)
CREATININE: 2.41 mg/dL — AB (ref 0.44–1.00)
Calcium: 9.3 mg/dL (ref 8.9–10.3)
GFR calc non Af Amer: 17 mL/min — ABNORMAL LOW (ref >60)
GFR, EST AFRICAN AMERICAN: 19 mL/min — AB (ref >60)
Glucose, Bld: 122 mg/dL — ABNORMAL HIGH (ref 65–99)
Potassium: 4.3 mmol/L (ref 3.5–5.1)
SODIUM: 136 mmol/L (ref 135–145)

## 2016-06-28 LAB — CBC
HEMATOCRIT: 34.9 % — AB (ref 36.0–46.0)
Hemoglobin: 11.2 g/dL — ABNORMAL LOW (ref 12.0–15.0)
MCH: 29 pg (ref 26.0–34.0)
MCHC: 32.1 g/dL (ref 30.0–36.0)
MCV: 90.4 fL (ref 78.0–100.0)
Platelets: 194 10*3/uL (ref 150–400)
RBC: 3.86 MIL/uL — AB (ref 3.87–5.11)
RDW: 15.7 % — AB (ref 11.5–15.5)
WBC: 9 10*3/uL (ref 4.0–10.5)

## 2016-06-28 LAB — HEPARIN LEVEL (UNFRACTIONATED)
HEPARIN UNFRACTIONATED: 0.14 [IU]/mL — AB (ref 0.30–0.70)
Heparin Unfractionated: 0.22 IU/mL — ABNORMAL LOW (ref 0.30–0.70)

## 2016-06-28 SURGERY — ECHOCARDIOGRAM, TRANSESOPHAGEAL
Anesthesia: General

## 2016-06-28 MED ORDER — ALPRAZOLAM 0.25 MG PO TABS
0.2500 mg | ORAL_TABLET | Freq: Every day | ORAL | Status: DC | PRN
  Administered 2016-06-29: 0.25 mg via ORAL
  Filled 2016-06-28: qty 1

## 2016-06-28 MED ORDER — PROPOFOL 500 MG/50ML IV EMUL
INTRAVENOUS | Status: DC | PRN
  Administered 2016-06-28: 100 ug/kg/min via INTRAVENOUS

## 2016-06-28 MED ORDER — WARFARIN VIDEO: Freq: Once | Status: DC

## 2016-06-28 MED ORDER — FUROSEMIDE 10 MG/ML IJ SOLN
160.0000 mg | Freq: Three times a day (TID) | INTRAVENOUS | Status: DC
  Administered 2016-06-28 – 2016-06-29 (×2): 160 mg via INTRAVENOUS
  Filled 2016-06-28 (×4): qty 16

## 2016-06-28 MED ORDER — PHENYLEPHRINE 40 MCG/ML (10ML) SYRINGE FOR IV PUSH (FOR BLOOD PRESSURE SUPPORT)
PREFILLED_SYRINGE | INTRAVENOUS | Status: DC | PRN
  Administered 2016-06-28 (×3): 80 ug via INTRAVENOUS

## 2016-06-28 MED ORDER — WARFARIN - PHARMACIST DOSING INPATIENT: Freq: Every day | Status: DC

## 2016-06-28 MED ORDER — COUMADIN BOOK
Freq: Once | Status: AC
  Administered 2016-06-28: 12:00:00
  Filled 2016-06-28: qty 1

## 2016-06-28 MED ORDER — LIDOCAINE VISCOUS 2 % MT SOLN
OROMUCOSAL | Status: AC
  Filled 2016-06-28: qty 15

## 2016-06-28 MED ORDER — WARFARIN SODIUM 5 MG PO TABS
5.0000 mg | ORAL_TABLET | Freq: Once | ORAL | Status: AC
  Administered 2016-06-28: 5 mg via ORAL
  Filled 2016-06-28: qty 1

## 2016-06-28 MED ORDER — LIDOCAINE VISCOUS 2 % MT SOLN
OROMUCOSAL | Status: DC | PRN
  Administered 2016-06-28: 1 via OROMUCOSAL

## 2016-06-28 MED ORDER — SODIUM CHLORIDE 0.9 % IV SOLN
INTRAVENOUS | Status: DC | PRN
  Administered 2016-06-28: 14:00:00 via INTRAVENOUS

## 2016-06-28 NOTE — Progress Notes (Signed)
ANTICOAGULATION CONSULT NOTE - Follow Up Consult  Pharmacy Consult for Heparin; add Coumadin Indication: atrial fibrillation  Allergies  Allergen Reactions  . Lithostat [Acetohydroxamic Acid] Other (See Comments)    Unusual tiredness, weakness, nervousness, mental confusion, sore throat, Poor appetite    Patient Measurements: Height: 5\' 2"  (157.5 cm) Weight: 249 lb 12.5 oz (113.3 kg) IBW/kg (Calculated) : 50.1 Heparin Dosing Weight: 77kg  Vital Signs: Temp: 98.4 F (36.9 C) (02/07 1433) Temp Source: Oral (02/07 1433) BP: 94/44 (02/07 1440) Pulse Rate: 79 (02/07 1433)  Labs:  Recent Labs  06/26/16 0320  06/27/16 0317 07/06/2016 0349 06/23/2016 1510  HGB 11.0*  --  11.1* 11.2*  --   HCT 34.8*  --  35.1* 34.9*  --   PLT 221  --  209 194  --   HEPARINUNFRC 0.97*  < > 0.37 0.14* 0.22*  CREATININE 2.65*  --  2.68* 2.41*  --   < > = values in this interval not displayed.  Estimated Creatinine Clearance: 18.8 mL/min (by C-G formula based on SCr of 2.41 mg/dL (H)).   Medications:  Heparin @ 900 units/hr  Assessment: 89yof on xarelto pta for hx DVT, transitioned to heparin in setting of renal failure and new onset afib. Pt had successful cardioversion this afternoon. Heparin level remains low despite rate increase this morning. HL 0.2  Goal of Therapy:  Heparin level 0.3-0.7 INR 2-3 Monitor platelets by anticoagulation protocol: Yes   Plan:  -Increase heparin to 1150 units/hr -Daily HL, CBC   Emarion Toral, Jake Church 06/24/2016,3:52 PM

## 2016-06-28 NOTE — Anesthesia Postprocedure Evaluation (Addendum)
Anesthesia Post Note  Patient: Christina Martin  Procedure(s) Performed: Procedure(s) (LRB): TRANSESOPHAGEAL ECHOCARDIOGRAM (TEE) (N/A) CARDIOVERSION (N/A)  Patient location during evaluation: Endoscopy Anesthesia Type: General Level of consciousness: awake and alert Pain management: pain level controlled Vital Signs Assessment: post-procedure vital signs reviewed and stable Respiratory status: spontaneous breathing, nonlabored ventilation, respiratory function stable and patient connected to nasal cannula oxygen Cardiovascular status: blood pressure returned to baseline and stable Postop Assessment: no signs of nausea or vomiting Anesthetic complications: no       Last Vitals:  Vitals:   06/23/2016 1433 07/09/2016 1440  BP: (!) 92/40 (!) 94/44  Pulse: 79   Resp: (!) 22   Temp: 36.9 C     Last Pain:  Vitals:   07/05/2016 1433  TempSrc: Oral  PainSc:                  Meliton Samad,JAMES TERRILL

## 2016-06-28 NOTE — Anesthesia Preprocedure Evaluation (Signed)
Anesthesia Evaluation  Patient identified by MRN, date of birth, ID band Patient awake    Reviewed: Allergy & Precautions, NPO status , Patient's Chart, lab work & pertinent test results  History of Anesthesia Complications Negative for: history of anesthetic complications  Airway Mallampati: II   Neck ROM: Full    Dental   Pulmonary former smoker,    breath sounds clear to auscultation       Cardiovascular hypertension, + Peripheral Vascular Disease and +CHF   Rhythm:Irregular     Neuro/Psych  Neuromuscular disease    GI/Hepatic GERD  ,  Endo/Other    Renal/GU Renal InsufficiencyRenal disease     Musculoskeletal  (+) Arthritis ,   Abdominal   Peds  Hematology   Anesthesia Other Findings   Reproductive/Obstetrics                             Anesthesia Physical Anesthesia Plan  ASA: III  Anesthesia Plan: General   Post-op Pain Management:    Induction: Intravenous  Airway Management Planned: Natural Airway and Simple Face Mask  Additional Equipment:   Intra-op Plan:   Post-operative Plan:   Informed Consent: I have reviewed the patients History and Physical, chart, labs and discussed the procedure including the risks, benefits and alternatives for the proposed anesthesia with the patient or authorized representative who has indicated his/her understanding and acceptance.   Dental advisory given  Plan Discussed with:   Anesthesia Plan Comments:         Anesthesia Quick Evaluation

## 2016-06-28 NOTE — Progress Notes (Signed)
Subjective: Interval History: No complaints. NPO for cardioversion today.   Objective: Vital signs in last 24 hours: Temp:  [97.7 F (36.5 Martin)-98.7 F (37.1 Martin)] 98.6 F (37 Martin) (02/07 0738) Pulse Rate:  [83-102] 100 (02/07 0738) Resp:  [16-22] 16 (02/07 0738) BP: (95-125)/(49-63) 108/63 (02/07 0738) SpO2:  [90 %-99 %] 98 % (02/07 0738) Weight:  [249 lb 12.5 oz (113.3 kg)] 249 lb 12.5 oz (113.3 kg) (02/07 0500) Weight change: 3.5 oz (0.1 kg)  Intake/Output from previous day: 02/06 0701 - 02/07 0700 In: 794.4 [I.V.:614.4; IV Piggyback:180] Out: 1300 [Urine:1300] Intake/Output this shift: No intake/output data recorded.  Gen: pleasant female, lying in bed, NAD  Neck: JVD present  CV: Irregularly irregular. No murmurs appreciated.  Lungs: Bibasilar crackles. Normal WOB. [place] in place.  Back: Mild presacral edema.  GI: soft, NTND  Ext: 2-3+ bilateral LE edema with overlying tight skin, venous stasis changes present of skin    Lab Results:  Recent Labs  06/27/16 0317 06/27/2016 0349  WBC 7.6 9.0  HGB 11.1* 11.2*  HCT 35.1* 34.9*  PLT 209 194   BMET:   Recent Labs  06/27/16 0317 07/03/2016 0349  NA 136 136  K 4.5 4.3  CL 95* 96*  CO2 29 28  GLUCOSE 112* 122*  BUN 58* 57*  CREATININE 2.68* 2.41*  CALCIUM 9.3 9.3    Recent Labs  06/25/16 1412  PTH 41   Iron Studies:   Recent Labs  06/25/16 1412  IRON 48  TIBC 364   Studies/Results: Dg Chest 2 View  Result Date: 06/27/2016 CLINICAL DATA:  Congestive heart failure, dyspnea EXAM: CHEST  2 VIEW COMPARISON:  CXR 03/26/2015 FINDINGS: The heart size is within normal limits for size. Aortic atherosclerosis without aneurysm is again seen. There is mild interstitial edema with posterior small pleural effusions, left greater than right. Surgical anchors project over the right humeral head with osteoarthritic spurring and sclerosis of the right glenohumeral joint. Degenerative changes are seen along the dorsal spine.  IMPRESSION: Interstitial edema with small posterior pleural effusions left greater than right. Aortic atherosclerosis. Electronically Signed   By: Christina Martin M.D.   On: 06/27/2016 15:21    I have reviewed the patient's current medications.  Assessment/Plan: Christina Martin is 83 y.o. female with PMH of chronic diastolic CHF, CKD4, neurogenic bladder with SPC in place,  And chronic DVT on Xarelto who was admitted with lower extremity swelling and weakness. Patient was found to be in Afib with RVR. She has received diuresis for an acute exacerbation of CHF. Found to have an AKI at admission that worsened, so nephrology became involved in her care.   1. AKI: Baseline Cr 1.6-1.8.  Increased Lasix to 100 mg q8h yesterday. Patient had 1.3L UOP (neg 500 cc yesterday) but still net positive from this admission. CXR obtained yesterday showed interstitial edema with small bilateral pleural effusions. Weight without change and patient is up 6kg from weight in November. Cr with slight improvement from 2.7 to 2.4. Patient remains volume overloaded.  Suspect low perfusion given persistent hypotension complicated by use of AceI at home. Will increase Lasix dose to 160 mg q8h and monitor output s/p cardioversion.   2. Hypotension: Requiring Midodrine. Potential barrier to aggressive diuresis.  3. Anemia: With low-normal Fe and saturation ratio of 13%. Would give Feraheme.  4. AFib with RVR: Rate controlled with amiodarone drip. IV heparin. Planning for TEE/DCCV today. If patient able to be converted to NSR may help with  diuresis.  5. Acute CHF exacerbation: With volume excess. Diuresis.    LOS: 5 days   Christina Martin 06/24/2016,8:42 AM   Renal Attending: Some increased UOP but need to be more aggressive.  For CV today which will hopefully improve BP stability. Christina Martin

## 2016-06-28 NOTE — CV Procedure (Signed)
    TRANSESOPHAGEAL ECHOCARDIOGRAM GUIDED DIRECT CURRENT CARDIOVERSION  NAME:  Christina Martin   MRN: [id number] DOB:  07/10/1924   ADMIT DATE: 07/05/2016  INDICATIONS:   PROCEDURE:   Informed consent was obtained prior to the procedure. The risks, benefits and alternatives for the procedure were discussed and the patient comprehended these risks.  Risks include, but are not limited to, cough, sore throat, vomiting, nausea, somnolence, esophageal and stomach trauma or perforation, bleeding, low blood pressure, aspiration, pneumonia, infection, trauma to the teeth and death.    After a procedural time-out, the patient was given IV propofol for sedation by the anesthesia service.  The transesophageal probe was inserted in the esophagus and stomach without difficulty and multiple views were obtained.    FINDINGS:  LEFT VENTRICLE: EF = 65%% No regional wall motion abnormalities  RIGHT VENTRICLE: Normal size and function  LEFT ATRIUM: Moderate to severely dilated (diameter 4.5 cm - visually appeared larger)  LEFT ATRIAL APPENDAGE: No clot  RIGHT ATRIUM: Moderate to severely dilated  AORTIC VALVE:  Trileaflet. Mildly calcified. No AS/AI  MITRAL VALVE:    Normal. Mild MR  TRICUSPID VALVE: Normal. Severe TR  PULMONIC VALVE: Normal. No PI  INTERATRIAL SEPTUM: No PFO/ASD   PERICARDIUM: No effsuion  DESCENDING AORTA:  Mild plaque  CARDIOVERSION:     Indications:  Atrial Fibrillation  Procedure Details:  Once the TEE was complete, the patient had the defibrillator pads placed in the anterior and posterior position. The patient then underwent further sedation by the anesthesia service for cardioversion. Once an appropriate level of sedation was achieved, the patient received a single biphasic, synchronized 200J shock with prompt conversion to sinus rhythm.  Procedure complicated by mild hypotension requiring neosynephrine and hypoxia requiring jaw lift and O2 mask. Both resolved.    Mahala Rommel,MD 2:25 PM

## 2016-06-28 NOTE — H&P (View-Only) (Signed)
Advanced Heart Failure Rounding Note  PCP:  Primary Cardiologist: Dr. Angelena Form   Subjective:    600 ml of urine out overnight, remains positive overall. Remains in Afib, rates in the 90-100's. BP stable,  Still with orthopnea and SOB at rest. For TEE/DCCV today at 14:00.  Seems more somnolent today, says she didn't sleep well last night. She feels "panicky" at times. Denies palpitations and chest pain.    Objective:   Weight Range: 249 lb 12.5 oz (113.3 kg) Body mass index is 45.69 kg/m.   Vital Signs:   Temp:  [97.7 F (36.5 C)-98.7 F (37.1 C)] 98.1 F (36.7 C) (02/07 0400) Pulse Rate:  [83-102] 97 (02/07 0400) Resp:  [17-22] 18 (02/07 0400) BP: (95-125)/(49-58) 100/52 (02/07 0400) SpO2:  [90 %-99 %] 95 % (02/07 0400) Weight:  [249 lb 12.5 oz (113.3 kg)] 249 lb 12.5 oz (113.3 kg) (02/07 0500) Last BM Date: 06/25/16  Weight change: Filed Weights   06/26/16 0326 06/27/16 0338 07/19/2016 0500  Weight: 245 lb 6 oz (111.3 kg) 249 lb 9 oz (113.2 kg) 249 lb 12.5 oz (113.3 kg)    Intake/Output:   Intake/Output Summary (Last 24 hours) at 07/10/2016 0658 Last data filed at 06/27/2016 0600  Gross per 24 hour  Intake           794.36 ml  Output              700 ml  Net            94.36 ml     Physical Exam: General:  Lying in bed  No resp difficulty HEENT: normal Neck: supple. JVP to jaw . Carotids 2+ bilat; no bruits. No lymphadenopathy or thyromegaly appreciated. Cor: PMI nondisplaced. Irregular rate & rhythm. No rubs, gallops or murmurs. Lungs: basilar crackles  Abdomen: obese soft, nontender, nondistended. No hepatosplenomegaly. No bruits or masses. Good bowel sounds. Extremities: no cyanosis, clubbing, rash, 2+ edema Neuro: alert & orientedx3, cranial nerves grossly intact. moves all 4 extremities w/o difficulty. Affect pleasant   Telemetry: Afib, rates in the 90-100's   Labs: CBC  Recent Labs  06/25/16 1412  06/27/16 0317 07/03/2016 0349  WBC  --   < > 7.6  9.0  NEUTROABS 4.3  --   --   --   HGB  --   < > 11.1* 11.2*  HCT  --   < > 35.1* 34.9*  MCV  --   < > 90.9 90.4  PLT  --   < > 209 194  < > = values in this interval not displayed. Basic Metabolic Panel  Recent Labs  06/26/16 0320 06/27/16 0317 07/10/2016 0349  NA 136 136 136  K 4.4 4.5 4.3  CL 99* 95* 96*  CO2 29 29 28   GLUCOSE 102* 112* 122*  BUN 51* 58* 57*  CREATININE 2.65* 2.68* 2.41*  CALCIUM 8.9 9.3 9.3  MG 2.1  --   --   PHOS 4.6  --   --    Liver Function Tests  Recent Labs  06/26/16 0320  AST 32  ALT 26  ALKPHOS 86  BILITOT 0.5  PROT 5.9*  ALBUMIN 2.9*    Imaging/Studies:  Dg Chest 2 View  Result Date: 06/27/2016 CLINICAL DATA:  Congestive heart failure, dyspnea EXAM: CHEST  2 VIEW COMPARISON:  CXR 03/26/2015 FINDINGS: The heart size is within normal limits for size. Aortic atherosclerosis without aneurysm is again seen. There is mild interstitial edema with posterior  small pleural effusions, left greater than right. Surgical anchors project over the right humeral head with osteoarthritic spurring and sclerosis of the right glenohumeral joint. Degenerative changes are seen along the dorsal spine. IMPRESSION: Interstitial edema with small posterior pleural effusions left greater than right. Aortic atherosclerosis. Electronically Signed   By: Ashley Royalty M.D.   On: 06/27/2016 15:21       Medications:     Scheduled Medications: . docusate sodium  100 mg Oral Daily  . feeding supplement (ENSURE ENLIVE)  237 mL Oral BID BM  . furosemide  100 mg Intravenous Q8H  . midodrine  5 mg Oral TID WC  . multivitamin with minerals  1 tablet Oral Daily  . pantoprazole  40 mg Oral Daily  . sodium chloride flush  3 mL Intravenous Q12H  . sodium chloride irrigation  70 mL Irrigation See admin instructions     Infusions: . amiodarone 30 mg/hr (06/27/16 2032)  . heparin 900 Units/hr (06/29/2016 0508)     PRN Medications:  acetaminophen, hydrocortisone cream,  lactulose, ondansetron **OR** ondansetron (ZOFRAN) IV, traMADol   Assessment/Plan   1. Acute on chronic diastolic CHF 2. Hypertensive heart disease 3. New onset atrial fibrillation     --CHADSVASC 7 4. Acute on chronic CKD stage IV 5. History of DVT on chronic anticoagulation 6. Morbid obesity  Remains volume overloaded on exam, has gotten 3 doses of 100mg  of lasix in the past 14 hours. Urine output remains minimal at 699ml. Weight remains the same as yesterday. Consider adding metolazone 2.5 mg today.   For TEE/DCCV today.   This patients CHA2DS2-VASc Score and unadjusted Ischemic Stroke Rate (% per year) is equal to 9.7 % stroke rate/year from a score of 6 Above score calculated as 1 point each if present [CHF, HTN, DM, Vascular=MI/PAD/Aortic Plaque, Age if 65-74, or Female], 2 points each if present [Age > 75, or Stroke/TIA/TE]  Continue heparin with plans to bridge to warfarin. No longer will be candidate for a DOAC due to renal insufficiency.   Continue IV Amiodarone today, hopefully NSR will be restored. Body habitus suggestive of sleep apnea, would benefit from sleep study.   Length of Stay: [place], NP  06/23/2016, 6:58 AM  Advanced Heart Failure Team Pager [phone] (M-F; 7a - 4p)  Please contact [facility] for night-coverage after hours (4p -7a ) and weekends on [email or web address]  Patient seen and examined with Jettie Booze, NP. We discussed all aspects of the encounter. I agree with the assessment and plan as stated above.   She remains in AF. Rate controlled on IV amio. On heparin. Is on high dose lasix without much output. Weight stable. Renal function slightly better. Will plan TEE/DC-CV today. Hopefully restoration of NSR will facilitate diuresis. Need to watch renal function closely. Start warfarin tonight.   Bensimhon, Daniel,MD 9:18 AM

## 2016-06-28 NOTE — Progress Notes (Signed)
Daily Progress Note   Patient Name: Christina Martin       Date: 07/14/2016 DOB: [birth year]  Age: 83 y.o. MRN#: [id number] Attending Physician: Domenic Polite, MD Primary Care Physician: [name], MD Admit Date: 06/30/2016  Reason for Consultation/Follow-up: Establishing goals of care and Psychosocial/spiritual support  Subjective: -continued conversation at bedside with patient and her sonn/Kevein regarding current  medial situation, patient is scheduled for cardioversion this afternoon  - patient and family remain hopeful for improvement and opportunity for SNF for rehab ultimately to return home  - patient and family are aware of the medical co-morbidites and high risk for decompensation, all are encouraged to continue conversation regarding anticipatory care needs and advanced directive decsions  Length of Stay: 5  Current Medications:  Scheduled Meds:  . coumadin book   Does not apply Once  . docusate sodium  100 mg Oral Daily  . feeding supplement (ENSURE ENLIVE)  237 mL Oral BID BM  . furosemide  160 mg Intravenous Q8H  . midodrine  5 mg Oral TID WC  . multivitamin with minerals  1 tablet Oral Daily  . pantoprazole  40 mg Oral Daily  . sodium chloride flush  3 mL Intravenous Q12H  . sodium chloride irrigation  70 mL Irrigation See admin instructions  . warfarin  5 mg Oral ONCE-1800  . warfarin   Does not apply Once  . Warfarin - Pharmacist Dosing Inpatient   Does not apply q1800    Continuous Infusions: . amiodarone 30 mg/hr (07/10/2016 1010)  . heparin 900 Units/hr (07/03/2016 0508)    PRN Meds: acetaminophen, ALPRAZolam, hydrocortisone cream, lactulose, ondansetron **OR** ondansetron (ZOFRAN) IV, traMADol  Physical Exam  Constitutional: She is oriented to person, place, and  time. She appears well-developed. She appears ill. Nasal cannula in place.  Cardiovascular: An irregular rhythm present.  Pulmonary/Chest: She has decreased breath sounds in the right lower field and the left lower field.  Musculoskeletal:  generalized muscle weakness, BLE +2 edema  Neurological: She is alert and oriented to person, place, and time.  Skin: Skin is warm and dry.            Vital Signs: BP (!) 94/44   Pulse 79   Temp 98.4 F (36.9 C) (Oral)   Resp (!) 22   Ht 5\' 2"  (  1.575 m)   Wt 113.3 kg (249 lb 12.5 oz)   SpO2 94%   BMI 45.69 kg/m  SpO2: SpO2: 94 % O2 Device: O2 Device: Nasal Cannula O2 Flow Rate: O2 Flow Rate (L/min): 2 L/min  Intake/output summary:  Intake/Output Summary (Last 24 hours) at 07/17/2016 1456 Last data filed at 06/24/2016 1435  Gross per 24 hour  Intake            962.6 ml  Output             1600 ml  Net           -637.4 ml   LBM: Last BM Date: 06/25/16 Baseline Weight: Weight: 112 kg (247 lb) Most recent weight: Weight: 113.3 kg (249 lb 12.5 oz)       Palliative Assessment/Data:  30% at best    Flowsheet Rows   Flowsheet Row Most Recent Value  Intake Tab  Referral Department  Hospitalist  Unit at Time of Referral  Cardiac/Telemetry Unit  Palliative Care Primary Diagnosis  Cardiac  Date Notified  07/07/2016  Palliative Care Type  New Palliative care  Reason for referral  Clarify Goals of Care  Date of Admission  06/29/2016  Date first seen by Palliative Care  06/27/16  # of days Palliative referral response time  4 Day(s)  # of days IP prior to Palliative referral  0  Clinical Assessment  Psychosocial & Spiritual Assessment  Palliative Care Outcomes      Patient Active Problem List   Diagnosis Date Noted  . DNR (do not resuscitate)   . Palliative care by specialist   . Atrial fibrillation ([place]) 07/01/2016  . Lower extremity edema 07/09/2016  . GERD without esophagitis 06/22/2016  . Acute renal failure superimposed on stage 4  chronic kidney disease ([place]) 07/02/2016  . Acute on chronic diastolic heart failure ([place]) 07/03/2016  . Essential hypertension 07/09/2016  . Hyperkalemia   . Elevated troponin   . PCP NOTES >>>>>> 03/27/2015  . Left leg DVT  08-2014 10/03/2014  . ATONIC BLADDER-- has a permananet suprapubic catheter  01/01/2009  . Uterine cancer  (h/O) 11/01/2007    Palliative Care Assessment & Plan   Patient Profile:  83 y.o. female   admitted on 07/16/2016 with past medical history significant for chronic diastolic CHF (last 2 D ECHO in 03/2015 showed EF 65% and grade 1 DD), CKD stage 4 with baseline creatinine 1.80 back in 03/2015, hypertension, GERD.   She presented to [facility] with worsening lower extremity swelling and difficulty ambulating over past 2 weeks prior to this admission. She also had shortness of breath with exertion.   Afib, successful  cardioversion today.  Treatment for fluid overload and CKD continues   Patient is hopeful for improvement   Assessment: High risk for decompensation  Recommendations/Plan/Goals  Treat the treatable  SNF for rehabilitation   Anticipate long term goals and care needs  Code Status:    Code Status Orders        Start     Ordered   06/26/16 1542  Do not attempt resuscitation (DNR)  Continuous    Question Answer Comment  In the event of cardiac or respiratory ARREST Do not call a "code blue"   In the event of cardiac or respiratory ARREST Do not perform Intubation, CPR, defibrillation or ACLS   In the event of cardiac or respiratory ARREST Use medication by any route, position, wound care, and other measures to relive  pain and suffering. May use oxygen, suction and manual treatment of airway obstruction as needed for comfort.      06/26/16 1541    Code Status History    Date Active Date Inactive Code Status Order ID Comments User Context   07/14/2016  6:31 PM 06/26/2016  3:41 PM Full Code ID:[id number]  Robbie Lis, MD Inpatient    03/26/2015  4:06 PM 03/29/2015  5:04 PM Partial Code [id number]  Willia Craze, NP Inpatient        Prognosis:   Dependant on desire for life prolonging interventions, long term prognosis is poor  Discharge Planning:  SNF for rehab if eligible   Care plan was discussed with Dr Broadus John   Thank you for allowing the Palliative Medicine Team to assist in the care of this patient.   Time In: 0854 Time Out: 0920 Total Time 35 min Prolonged Time Billed  no      Greater than 50%  of this time was spent counseling and coordinating care related to the above assessment and plan.  Wadie Lessen, NP  Please contact Palliative Medicine Team phone at [phone] for questions and concerns.

## 2016-06-28 NOTE — Progress Notes (Deleted)
Progress Note  Patient Name: Christina Martin Date of Encounter: 06/22/2016  Primary Cardiologist: [place]   Patient denies having any chest pain, SOB, or palpitations. No other complaints.   Inpatient Medications    Scheduled Meds: . docusate sodium  100 mg Oral Daily  . feeding supplement (ENSURE ENLIVE)  237 mL Oral BID BM  . furosemide  100 mg Intravenous Q8H  . midodrine  5 mg Oral TID WC  . multivitamin with minerals  1 tablet Oral Daily  . pantoprazole  40 mg Oral Daily  . sodium chloride flush  3 mL Intravenous Q12H  . sodium chloride irrigation  70 mL Irrigation See admin instructions   Continuous Infusions: . amiodarone 30 mg/hr (06/27/16 2032)  . heparin 900 Units/hr (07/06/2016 0508)   PRN Meds: acetaminophen, hydrocortisone cream, lactulose, ondansetron **OR** ondansetron (ZOFRAN) IV, traMADol   Vital Signs    Vitals:   06/30/2016 0000 06/24/2016 0400 07/16/2016 0500 07/01/2016 0738  BP: (!) 117/51 (!) 100/52  108/63  Pulse: 97 97  100  Resp: 19 18  16   Temp: 97.9 F (36.6 C) 98.1 F (36.7 C)  98.6 F (37 C)  TempSrc: Oral Oral  Oral  SpO2: 90% 95%  98%  Weight:   249 lb 12.5 oz (113.3 kg)   Height:        Intake/Output Summary (Last 24 hours) at 07/01/2016 0750 Last data filed at 07/03/2016 0600  Gross per 24 hour  Intake           794.36 ml  Output             1300 ml  Net          -505.64 ml   Filed Weights   06/26/16 0326 06/27/16 0338 07/12/2016 0500  Weight: 245 lb 6 oz (111.3 kg) 249 lb 9 oz (113.2 kg) 249 lb 12.5 oz (113.3 kg)    Telemetry    Atrial fib - Personally Reviewed   Physical Exam   GEN: No acute distress.  Neck: +JVD  Cardiac: Irreg irreg, no murmurs, rubs, or gallops.  Respiratory: Bibasilar rales  GI: Soft, nontender, non-distended.  Ext: 2-3+ bilateral lower ext edema.  Neuro:  Nonfocal  Psych: Normal affect   Labs    Chemistry Recent Labs Lab 06/30/2016 1841  06/26/16 0320 06/27/16 0317 06/26/2016 0349    NA 136  < > 136 136 136  K 5.2*  < > 4.4 4.5 4.3  CL 101  < > 99* 95* 96*  CO2 29  < > 29 29 28   GLUCOSE 141*  < > 102* 112* 122*  BUN 43*  < > 51* 58* 57*  CREATININE 2.15*  < > 2.65* 2.68* 2.41*  CALCIUM 9.8  < > 8.9 9.3 9.3  PROT 6.3*  --  5.9*  --   --   ALBUMIN 3.2*  --  2.9*  --   --   AST 26  --  32  --   --   ALT 22  --  26  --   --   ALKPHOS 83  --  86  --   --   BILITOT 0.7  --  0.5  --   --   GFRNONAA 19*  < > 15* 15* 17*  GFRAA 22*  < > 17* 17* 19*  ANIONGAP 6  < > 8 12 12   < > = values in this interval not displayed.   Hematology  Recent  Labs Lab 06/26/16 0320 06/27/16 0317 07/08/2016 0349  WBC 8.5 7.6 9.0  RBC 3.84* 3.86* 3.86*  HGB 11.0* 11.1* 11.2*  HCT 34.8* 35.1* 34.9*  MCV 90.6 90.9 90.4  MCH 28.6 28.8 29.0  MCHC 31.6 31.6 32.1  RDW 15.9* 16.0* 15.7*  PLT 221 209 194    Cardiac Enzymes  Recent Labs Lab 07/14/2016 1841 06/24/16 0005 06/24/16 0904  TROPONINI 0.04* 0.05* 0.04*   No results for input(s): TROPIPOC in the last 168 hours.   BNPNo results for input(s): BNP, PROBNP in the last 168 hours.   DDimer No results for input(s): DDIMER in the last 168 hours.   Radiology    Dg Chest 2 View  Result Date: 06/27/2016 CLINICAL DATA:  Congestive heart failure, dyspnea EXAM: CHEST  2 VIEW COMPARISON:  CXR 03/26/2015 FINDINGS: The heart size is within normal limits for size. Aortic atherosclerosis without aneurysm is again seen. There is mild interstitial edema with posterior small pleural effusions, left greater than right. Surgical anchors project over the right humeral head with osteoarthritic spurring and sclerosis of the right glenohumeral joint. Degenerative changes are seen along the dorsal spine. IMPRESSION: Interstitial edema with small posterior pleural effusions left greater than right. Aortic atherosclerosis. Electronically Signed   By: Ashley Royalty M.D.   On: 06/27/2016 15:21    Cardiac Studies   Echo 06/24/16: Left ventricle: The cavity  size was normal. Wall thickness was   increased in a pattern of mild LVH. Systolic function was normal.   The estimated ejection fraction was in the range of 60% to 65%.   Wall motion was normal; there were no regional wall motion   abnormalities. The study is not technically sufficient to allow   evaluation of LV diastolic function. - Aortic valve: Trileaflet. Sclerosis without stenosis. There was   no regurgitation. - Mitral valve: Mildly thickened leaflets . There was trivial   regurgitation. - Left atrium: Moderately dilated. - Right ventricle: The cavity size was moderately dilated. Systolic   function is mildly reduced. - Tricuspid valve: There was moderate regurgitation. - Pulmonary arteries: PA peak pressure: 39 mm Hg (S). - Inferior vena cava: The vessel was dilated. The respirophasic   diameter changes were blunted (< 50%), consistent with elevated   central venous pressure.  Impressions:  - Compared to a prior study in 2016, the LVEF is stable at 60-65%.   There is now moderate LAE and severe RAE, moderate TR with a   lower RVSP at 39 mmHg, the IVC is dilated. Rhythm is noted to be   atrial fibrillation.  Patient Profile     83 y.o. female with history of chronic diastolic CHF, chronic LE edema. Chronic kidney disease, chronic DVT on Xarelto admitted with leg swelling and weakness and found to be in atrial fib with RVR  Assessment & Plan    1. Acute on chronic diastolic CHF: Normal LV systolic function but possible mild RV dysfunction. Likely exacerbated by new onset atrial fib and worsening CKD. Patient has not been diuresing well.  Exam with +JVD, crackles at lung bases, +2 to +3 pitting bilateral LE edema. CXR done 2/6 showing interstitial edema with small posterior pleural effusions L>R. Her weight was 237 lbs in 03/2016 and is 249 lbs now. Dose of Lasix was increased to IV 100 mg q8 yesterday. Net -505 cc in the past 24 hrs. BP now improving with Midodrine. Renal  function improving - SCr 2.4 and BUN 57 today. However, she continues  to be volume overloaded. Will hold off adding Milrinone given relative hypotension. Will hold off adding dopamine with Afib.  Will continue IV diuresis and Midodrine for now.   2. Atrial fib with RVR: Rate is controlled this am on amiodarone drip. Will hold off adding a beta blocker at this time because patient is still requiring Midodrine to bring up her blood pressure. CHA2DS2 VASC 5. She had been on Xarelto but now on hold with renal insufficiency. She may need coumadin at time of discharge if renal function does not improve. She is now on IV heparin now. Patient was seen by the advanced heart failure team yesterday and they are planning on TEE/DCCV today.   Signed, Shela Leff, MD  07/03/2016, 7:50 AM

## 2016-06-28 NOTE — Progress Notes (Signed)
Patient ID: Christina Martin, female   DOB: 01/01/1925, 83 y.o.   MRN: [id number]  PROGRESS NOTE    RODNESHA ENGHOLM  [id number] DOB: [birth year] DOA: 06/26/2016  PCP: [name], MD   Brief Narrative:  83 y.o.femalewith medical history significant for chronic diastolic CHF (last 2 D ECHO in 03/2015 showed EF 65% and grade 1 DD), CKD stage 4 with baseline creatinine 1.80 back in 03/2015, hypertension, GERD. She presented to [facility] with worsening lower extremity swelling and difficulty ambulating over past 2 weeks prior to this admission. She also had shortness of breath with exertion. She took extra dose of lasix for past 1 week without significant symptomatic relief.  Pt was hemodynamically stable. Her blood work showed potassium of 5.2, Cr 2.15, troponin 0.04. The 12 lead EKG showed atrial fibrillation. She was started on IV lasix 60 mg IV Q 12 hours. Cardio seen in consultation. Due to renal insufficiency nephrology has been consulted as well.    Assessment & Plan:   Lower extremity edema /Acute on chronic diastolic heart failure (HCC) - ECHO with a EF of 60-65%, no wall motion abnormalities, moderate left atrial enlargement and severe RAE, moderate TR IVC dilated. - Now on high dose IV lasix -160mg  Q8 - continue milrinone - Bmet in am    New onset atrial fibrillation - CHADS vasc score 5, continue amiodarone drip - Initially on xarelto but this was placed on hold due to renal insufficiency so she is on heparin drip - Plan for cardioversion today    Hx of Left leg DVT 08-2014 - Decreased the dose to 10 mg a day by hematology and she was supposed to be on it through 08/2016.  - Xarelto on hold due to renal insufficiency  - On heparin drip  - Lower extremity Doppler negative for DVT  Essential hypertension - Lisinopril on hold due to renal insufficiency  - On IV lasix, midodrine   Hyperkalemia - improved  Elevated troponin - Likely demand ischemia due to  CKD, acute CHF - Troponin 0.04, 0.05 and 0.04 - No plans for ischemic workup at this time  GERD without esophagitis - Continue protonix   Acute renal failure superimposed on stage 4 chronic kidney disease (HCC) - Cr baseline is 1.6 on 02/2016 and on this admission 2.15 which has worsened to 2.68 this am - Renal ultrasound with mild right renal atrophy. No hydronephrosis or renal obstruction noted. - Per renal, likely low perfusion in setting of ACEi, AIN - on IV lasix per Renal  Anemia of chronic kidney disease - stable, monitor  DVT prophylaxis: On heparin drip  Code Status: DNR/DNI Family Communication: Family at the bedside this am Disposition Plan: Home versus SNF once cleared by renal and cards; still on IV lasix     Consultants:   Cardiology: Dr Debara Pickett 06/24/2016  Renal  Procedures:   Renal ultrasound 06/25/2016  Lower extremity dopplers 06/24/2016  2-D echo 06/24/2016 - EF 60%  Antimicrobials:   None   Subjective: Feels better, no complaints now  Objective: Vitals:   07/02/2016 0500 07/18/2016 0738 06/25/2016 1056 07/07/2016 1322  BP:  108/63 (!) 144/59 (!) 106/39  Pulse:  100 (!) 106 91  Resp:  16 (!) 22 18  Temp:  98.6 F (37 C) 98.6 F (37 C) 98.5 F (36.9 C)  TempSrc:  Oral Oral Oral  SpO2:  98% 96% 93%  Weight: 113.3 kg (249 lb 12.5 oz)     Height:  Intake/Output Summary (Last 24 hours) at 07/08/2016 1337 Last data filed at 06/30/2016 0900  Gross per 24 hour  Intake            887.6 ml  Output             1600 ml  Net           -712.4 ml   Filed Weights   06/26/16 0326 06/27/16 0338 07/17/2016 0500  Weight: 111.3 kg (245 lb 6 oz) 113.2 kg (249 lb 9 oz) 113.3 kg (249 lb 12.5 oz)    Examination:  General exam: AAOx3, no distress Respiratory system: few basilar crackles Cardiovascular system: S1 & S2 heard, slight tachycardia  Gastrointestinal system: A(+) BS, non tender abd  Central nervous system: No focal neurological  deficits. Extremities: +2 LE pitting edema, redness of bilateral extremities with chronic venous skin changes  Skin: Skin is warm and dry   Psychiatry: Normal mood and behavior   Data Reviewed: I have personally reviewed following labs and imaging studies  CBC:  Recent Labs Lab 06/24/16 0904 06/25/16 0409 06/25/16 1412 06/26/16 0320 06/27/16 0317 06/30/2016 0349  WBC 6.6 6.7  --  8.5 7.6 9.0  NEUTROABS  --   --  4.3  --   --   --   HGB 10.6* 10.9*  --  11.0* 11.1* 11.2*  HCT 33.0* 34.2*  --  34.8* 35.1* 34.9*  MCV 89.7 90.7  --  90.6 90.9 90.4  PLT 212 222  --  221 209 [id number]   Basic Metabolic Panel:  Recent Labs Lab 07/03/2016 1841  06/25/16 0409 06/25/16 0808 06/25/16 1256 06/26/16 0320 06/27/16 0317 07/15/2016 0349  NA 136  < > 137  --  137 136 136 136  K 5.2*  < > 5.4* 5.5* 4.8 4.4 4.5 4.3  CL 101  < > 101  --  99* 99* 95* 96*  CO2 29  < > 30  --  29 29 29 28   GLUCOSE 141*  < > 97  --  111* 102* 112* 122*  BUN 43*  < > 49*  --  50* 51* 58* 57*  CREATININE 2.15*  < > 2.53*  --  2.54* 2.65* 2.68* 2.41*  CALCIUM 9.8  < > 9.4  --  9.5 8.9 9.3 9.3  MG 2.3  --   --   --   --  2.1  --   --   PHOS 2.8  --   --   --   --  4.6  --   --   < > = values in this interval not displayed. GFR: Estimated Creatinine Clearance: 18.8 mL/min (by C-G formula based on SCr of 2.41 mg/dL (H)). Liver Function Tests:  Recent Labs Lab 07/06/2016 1841 06/26/16 0320  AST 26 32  ALT 22 26  ALKPHOS 83 86  BILITOT 0.7 0.5  PROT 6.3* 5.9*  ALBUMIN 3.2* 2.9*   No results for input(s): LIPASE, AMYLASE in the last 168 hours. No results for input(s): AMMONIA in the last 168 hours. Coagulation Profile:  Recent Labs Lab 06/24/2016 1841  INR 1.13   Cardiac Enzymes:  Recent Labs Lab 07/10/2016 1841 06/24/16 0005 06/24/16 0904  TROPONINI 0.04* 0.05* 0.04*   BNP (last 3 results) No results for input(s): PROBNP in the last 8760 hours. HbA1C: No results for input(s): HGBA1C in the last 72  hours. CBG:  Recent Labs Lab 06/28/2016 2135 06/25/16 2158 06/26/16 0825  GLUCAP 143* 114* 99  Lipid Profile: No results for input(s): CHOL, HDL, LDLCALC, TRIG, CHOLHDL, LDLDIRECT in the last 72 hours. Thyroid Function Tests: No results for input(s): TSH, T4TOTAL, FREET4, T3FREE, THYROIDAB in the last 72 hours. Anemia Panel:  Recent Labs  06/25/16 1412  TIBC 364  IRON 48   Urine analysis:    Component Value Date/Time   COLORURINE YELLOW 07/03/2016 1834   APPEARANCEUR CLOUDY (A) 06/25/2016 1834   LABSPEC 1.013 07/03/2016 1834   PHURINE 5.0 07/03/2016 1834   GLUCOSEU NEGATIVE 06/22/2016 1834   HGBUR SMALL (A) 06/29/2016 1834   BILIRUBINUR NEGATIVE 07/02/2016 1834   KETONESUR NEGATIVE 07/19/2016 1834   PROTEINUR NEGATIVE 07/01/2016 1834   NITRITE NEGATIVE 07/19/2016 1834   LEUKOCYTESUR LARGE (A) 06/30/2016 1834   Sepsis Labs: @LABRCNTIP (procalcitonin:4,lacticidven:4)    Urine culture     Status: Abnormal   Collection Time: 06/25/16  8:16 AM  Result Value Ref Range Status   Specimen Description URINE, SUPRAPUBIC  Final   Special Requests NONE  Final   Culture MULTIPLE SPECIES PRESENT, SUGGEST RECOLLECTION (A)  Final   Report Status 06/26/2016 FINAL  Final  MRSA PCR Screening     Status: None   Collection Time: 06/25/16  5:07 PM  Result Value Ref Range Status   MRSA by PCR NEGATIVE NEGATIVE Final      Radiology Studies: US Renal Result Date: 06/25/2016 Mild right renal atrophy. No hydronephrosis or renal obstruction is noted. Electronically Signed   By: Marijo Conception, M.D.   On: 06/25/2016 08:57     Scheduled Meds: . docusate sodium  100 mg Oral Daily  . feeding supplement   237 mL Oral BID BM  . furosemide  100 mg Intravenous Q8H  . midodrine  5 mg Oral TID WC  . multivitamin with min  1 tablet Oral Daily  . pantoprazole  40 mg Oral Daily   Continuous Infusions: . amiodarone 30 mg/hr (07/03/2016 1010)  . heparin 900 Units/hr (07/03/2016 0508)      LOS: 5 days    Time spent: 35 minutes  Greater than 50% of the time spent on counseling and coordinating the care.   Domenic Polite, MD [facility] Pager [phone]  If 7PM-7AM, please contact night-coverage [email or web address] Password [email or web address] 07/16/2016, 1:37 PM

## 2016-06-28 NOTE — Progress Notes (Signed)
Echocardiogram Echocardiogram Transesophageal has been performed.  Aggie Cosier 07/19/2016, 2:41 PM

## 2016-06-28 NOTE — Progress Notes (Signed)
[facility] for heparin  Indication: atrial fibrillation  Allergies  Allergen Reactions  . Lithostat [Acetohydroxamic Acid] Other (See Comments)    Unusual tiredness, weakness, nervousness, mental confusion, sore throat, Poor appetite   Patient Measurements: Height: 5\' 2"  (157.5 cm) Weight: 249 lb 9 oz (113.2 kg) IBW/kg (Calculated) : 50.1 Heparin Dosing Weight: 77  Vital Signs: Temp: 98.1 F (36.7 C) (02/07 0400) Temp Source: Oral (02/07 0400) BP: 100/52 (02/07 0400) Pulse Rate: 97 (02/07 0400)  Labs:  Recent Labs  06/26/16 0320  06/26/16 2328 06/27/16 0317 07/12/2016 0349  HGB 11.0*  --   --  11.1*  --   HCT 34.8*  --   --  35.1*  --   PLT 221  --   --  209  --   HEPARINUNFRC 0.97*  < > 0.54 0.37 0.14*  CREATININE 2.65*  --   --  2.68* 2.41*  < > = values in this interval not displayed.  Estimated Creatinine Clearance: 18.8 mL/min (by C-G formula based on SCr of 2.41 mg/dL (H)).   Marland Kitchen amiodarone 30 mg/hr (06/27/16 2032)  . heparin 750 Units/hr (06/27/16 2032)     Assessment: 83 y.o. female with new afib, on chronic Xarelto for hx DVT.  Currently Xarelto on hold for renal dysfunction, pharmacy dosing IV heparin.  Heparin level down to subtherapeutic (0.14) on gtt at 750 units/hr. No issues with line or bleeding reported per RN.  Goal of Therapy:  Heparin level 0.3-0.7 units/ml Monitor platelets by anticoagulation protocol: Yes   Plan:  Increase heparin to 900 units/hr F/u 8 hr heparin level F/u plans to resume oral anticoagulation eventually.  Sherlon Handing, PharmD, BCPS Clinical pharmacist, pager [phone]  06/24/2016 5:04 AM

## 2016-06-28 NOTE — Transfer of Care (Signed)
Immediate Anesthesia Transfer of Care Note  Patient: Christina Martin  Procedure(s) Performed: Procedure(s): TRANSESOPHAGEAL ECHOCARDIOGRAM (TEE) (N/A) CARDIOVERSION (N/A)  Patient Location: Endoscopy Unit  Anesthesia Type:General  Level of Consciousness: awake, oriented and patient cooperative  Airway & Oxygen Therapy: Patient Spontanous Breathing and Patient connected to nasal cannula oxygen  Post-op Assessment: Report given to RN, Post -op Vital signs reviewed and stable and Patient moving all extremities  Post vital signs: Reviewed and stable  Last Vitals:  Vitals:   07/08/2016 1322 06/27/2016 1433  BP: (!) 106/39 (!) 92/40  Pulse: 91 79  Resp: 18 (!) 22  Temp: 36.9 C     Last Pain:  Vitals:   06/30/2016 1433  TempSrc: Oral  PainSc:       Patients Stated Pain Goal: 2 ([id number])  Complications: No apparent anesthesia complications

## 2016-06-28 NOTE — Interval H&P Note (Signed)
History and Physical Interval Note:  06/30/2016 2:11 PM  Christina Martin  has presented today for surgery, with the diagnosis of afib  The various methods of treatment have been discussed with the patient and family. After consideration of risks, benefits and other options for treatment, the patient has consented to  Procedure(s): TRANSESOPHAGEAL ECHOCARDIOGRAM (TEE) (N/A) CARDIOVERSION (N/A) as a surgical intervention .  The patient's history has been reviewed, patient examined, no change in status, stable for surgery.  I have reviewed the patient's chart and labs.  Questions were answered to the patient's satisfaction.     Bensimhon, Quillian Quince

## 2016-06-28 NOTE — Progress Notes (Signed)
ANTICOAGULATION CONSULT NOTE - Follow Up Consult  Pharmacy Consult for Heparin; add Coumadin Indication: atrial fibrillation  Allergies  Allergen Reactions  . Lithostat [Acetohydroxamic Acid] Other (See Comments)    Unusual tiredness, weakness, nervousness, mental confusion, sore throat, Poor appetite    Patient Measurements: Height: 5\' 2"  (157.5 cm) Weight: 249 lb 12.5 oz (113.3 kg) IBW/kg (Calculated) : 50.1 Heparin Dosing Weight: 77kg  Vital Signs: Temp: 98.5 F (36.9 C) (02/07 1322) Temp Source: Oral (02/07 1322) BP: 106/39 (02/07 1322) Pulse Rate: 91 (02/07 1322)  Labs:  Recent Labs  06/26/16 0320  06/26/16 2328 06/27/16 0317 07/08/2016 0349  HGB 11.0*  --   --  11.1* 11.2*  HCT 34.8*  --   --  35.1* 34.9*  PLT 221  --   --  209 194  HEPARINUNFRC 0.97*  < > 0.54 0.37 0.14*  CREATININE 2.65*  --   --  2.68* 2.41*  < > = values in this interval not displayed.  Estimated Creatinine Clearance: 18.8 mL/min (by C-G formula based on SCr of 2.41 mg/dL (H)).   Medications:  Heparin @ 900 units/hr  Assessment: 89yof on xarelto pta for hx DVT, transitioned to heparin in setting of renal failure and new onset afib. Heparin level low this morning and rate increased. Follow up level pending. She is down for cardioversion right now. She will also start coumadin (better choice in renal failure than xarelto). Coumadin score = 4. On amiodarone which can potentiate INR so will need to watch for drug interaction.  Goal of Therapy:  Heparin level 0.3-0.7 INR 2-3 Monitor platelets by anticoagulation protocol: Yes   Plan:  1) Follow up afternoon heparin level 2) Coumadin 5mg  x 1 3) Daily INR 4) Coumadin education - book/video  [name] 07/17/2016,1:49 PM

## 2016-06-28 NOTE — Progress Notes (Signed)
Advanced Heart Failure Rounding Note  PCP:  Primary Cardiologist: Dr. Angelena Form   Subjective:    600 ml of urine out overnight, remains positive overall. Remains in Afib, rates in the 90-100's. BP stable,  Still with orthopnea and SOB at rest. For TEE/DCCV today at 14:00.  Seems more somnolent today, says she didn't sleep well last night. She feels "panicky" at times. Denies palpitations and chest pain.    Objective:   Weight Range: 249 lb 12.5 oz (113.3 kg) Body mass index is 45.69 kg/m.   Vital Signs:   Temp:  [97.7 F (36.5 C)-98.7 F (37.1 C)] 98.1 F (36.7 C) (02/07 0400) Pulse Rate:  [83-102] 97 (02/07 0400) Resp:  [17-22] 18 (02/07 0400) BP: (95-125)/(49-58) 100/52 (02/07 0400) SpO2:  [90 %-99 %] 95 % (02/07 0400) Weight:  [249 lb 12.5 oz (113.3 kg)] 249 lb 12.5 oz (113.3 kg) (02/07 0500) Last BM Date: 06/25/16  Weight change: Filed Weights   06/26/16 0326 06/27/16 0338 07/12/2016 0500  Weight: 245 lb 6 oz (111.3 kg) 249 lb 9 oz (113.2 kg) 249 lb 12.5 oz (113.3 kg)    Intake/Output:   Intake/Output Summary (Last 24 hours) at 07/18/2016 0658 Last data filed at 06/29/2016 0600  Gross per 24 hour  Intake           794.36 ml  Output              700 ml  Net            94.36 ml     Physical Exam: General:  Lying in bed  No resp difficulty HEENT: normal Neck: supple. JVP to jaw . Carotids 2+ bilat; no bruits. No lymphadenopathy or thyromegaly appreciated. Cor: PMI nondisplaced. Irregular rate & rhythm. No rubs, gallops or murmurs. Lungs: basilar crackles  Abdomen: obese soft, nontender, nondistended. No hepatosplenomegaly. No bruits or masses. Good bowel sounds. Extremities: no cyanosis, clubbing, rash, 2+ edema Neuro: alert & orientedx3, cranial nerves grossly intact. moves all 4 extremities w/o difficulty. Affect pleasant   Telemetry: Afib, rates in the 90-100's   Labs: CBC  Recent Labs  06/25/16 1412  06/27/16 0317 07/02/2016 0349  WBC  --   < > 7.6  9.0  NEUTROABS 4.3  --   --   --   HGB  --   < > 11.1* 11.2*  HCT  --   < > 35.1* 34.9*  MCV  --   < > 90.9 90.4  PLT  --   < > 209 194  < > = values in this interval not displayed. Basic Metabolic Panel  Recent Labs  06/26/16 0320 06/27/16 0317 06/27/2016 0349  NA 136 136 136  K 4.4 4.5 4.3  CL 99* 95* 96*  CO2 29 29 28   GLUCOSE 102* 112* 122*  BUN 51* 58* 57*  CREATININE 2.65* 2.68* 2.41*  CALCIUM 8.9 9.3 9.3  MG 2.1  --   --   PHOS 4.6  --   --    Liver Function Tests  Recent Labs  06/26/16 0320  AST 32  ALT 26  ALKPHOS 86  BILITOT 0.5  PROT 5.9*  ALBUMIN 2.9*    Imaging/Studies:  Dg Chest 2 View  Result Date: 06/27/2016 CLINICAL DATA:  Congestive heart failure, dyspnea EXAM: CHEST  2 VIEW COMPARISON:  CXR 03/26/2015 FINDINGS: The heart size is within normal limits for size. Aortic atherosclerosis without aneurysm is again seen. There is mild interstitial edema with posterior  small pleural effusions, left greater than right. Surgical anchors project over the right humeral head with osteoarthritic spurring and sclerosis of the right glenohumeral joint. Degenerative changes are seen along the dorsal spine. IMPRESSION: Interstitial edema with small posterior pleural effusions left greater than right. Aortic atherosclerosis. Electronically Signed   By: Ashley Royalty M.D.   On: 06/27/2016 15:21       Medications:     Scheduled Medications: . docusate sodium  100 mg Oral Daily  . feeding supplement (ENSURE ENLIVE)  237 mL Oral BID BM  . furosemide  100 mg Intravenous Q8H  . midodrine  5 mg Oral TID WC  . multivitamin with minerals  1 tablet Oral Daily  . pantoprazole  40 mg Oral Daily  . sodium chloride flush  3 mL Intravenous Q12H  . sodium chloride irrigation  70 mL Irrigation See admin instructions     Infusions: . amiodarone 30 mg/hr (06/27/16 2032)  . heparin 900 Units/hr (07/10/2016 0508)     PRN Medications:  acetaminophen, hydrocortisone cream,  lactulose, ondansetron **OR** ondansetron (ZOFRAN) IV, traMADol   Assessment/Plan   1. Acute on chronic diastolic CHF 2. Hypertensive heart disease 3. New onset atrial fibrillation     --CHADSVASC 7 4. Acute on chronic CKD stage IV 5. History of DVT on chronic anticoagulation 6. Morbid obesity  Remains volume overloaded on exam, has gotten 3 doses of 100mg  of lasix in the past 14 hours. Urine output remains minimal at 692ml. Weight remains the same as yesterday. Consider adding metolazone 2.5 mg today.   For TEE/DCCV today.   This patients CHA2DS2-VASc Score and unadjusted Ischemic Stroke Rate (% per year) is equal to 9.7 % stroke rate/year from a score of 6 Above score calculated as 1 point each if present [CHF, HTN, DM, Vascular=MI/PAD/Aortic Plaque, Age if 65-74, or Female], 2 points each if present [Age > 75, or Stroke/TIA/TE]  Continue heparin with plans to bridge to warfarin. No longer will be candidate for a DOAC due to renal insufficiency.   Continue IV Amiodarone today, hopefully NSR will be restored. Body habitus suggestive of sleep apnea, would benefit from sleep study.   Length of Stay: [place], NP  06/27/2016, 6:58 AM  Advanced Heart Failure Team Pager [phone] (M-F; 7a - 4p)  Please contact [facility] for night-coverage after hours (4p -7a ) and weekends on [email or web address]  Patient seen and examined with Jettie Booze, NP. We discussed all aspects of the encounter. I agree with the assessment and plan as stated above.   She remains in AF. Rate controlled on IV amio. On heparin. Is on high dose lasix without much output. Weight stable. Renal function slightly better. Will plan TEE/DC-CV today. Hopefully restoration of NSR will facilitate diuresis. Need to watch renal function closely. Start warfarin tonight.   Christina Grassi,MD 9:18 AM

## 2016-06-29 ENCOUNTER — Inpatient Hospital Stay (HOSPITAL_COMMUNITY): Payer: Medicare Other

## 2016-06-29 LAB — POCT I-STAT 3, ART BLOOD GAS (G3+)
Acid-Base Excess: 1 mmol/L (ref 0.0–2.0)
Bicarbonate: 30.1 mmol/L — ABNORMAL HIGH (ref 20.0–28.0)
O2 SAT: 92 %
TCO2: 32 mmol/L (ref 0–100)
pCO2 arterial: 71.3 mmHg (ref 32.0–48.0)
pH, Arterial: 7.233 — ABNORMAL LOW (ref 7.350–7.450)
pO2, Arterial: 79 mmHg — ABNORMAL LOW (ref 83.0–108.0)

## 2016-06-29 LAB — CBC
HEMATOCRIT: 36.9 % (ref 36.0–46.0)
HEMOGLOBIN: 11.8 g/dL — AB (ref 12.0–15.0)
MCH: 29.1 pg (ref 26.0–34.0)
MCHC: 32 g/dL (ref 30.0–36.0)
MCV: 91.1 fL (ref 78.0–100.0)
Platelets: 210 10*3/uL (ref 150–400)
RBC: 4.05 MIL/uL (ref 3.87–5.11)
RDW: 15.8 % — AB (ref 11.5–15.5)
WBC: 9.7 10*3/uL (ref 4.0–10.5)

## 2016-06-29 LAB — BASIC METABOLIC PANEL
Anion gap: 13 (ref 5–15)
BUN: 60 mg/dL — AB (ref 6–20)
CHLORIDE: 94 mmol/L — AB (ref 101–111)
CO2: 28 mmol/L (ref 22–32)
Calcium: 9.4 mg/dL (ref 8.9–10.3)
Creatinine, Ser: 2.45 mg/dL — ABNORMAL HIGH (ref 0.44–1.00)
GFR, EST AFRICAN AMERICAN: 19 mL/min — AB (ref >60)
GFR, EST NON AFRICAN AMERICAN: 16 mL/min — AB (ref >60)
Glucose, Bld: 135 mg/dL — ABNORMAL HIGH (ref 65–99)
POTASSIUM: 4.3 mmol/L (ref 3.5–5.1)
SODIUM: 135 mmol/L (ref 135–145)

## 2016-06-29 LAB — PROTIME-INR
INR: 1.07
Prothrombin Time: 14 seconds (ref 11.4–15.2)

## 2016-06-29 LAB — HEPARIN LEVEL (UNFRACTIONATED): HEPARIN UNFRACTIONATED: 0.54 [IU]/mL (ref 0.30–0.70)

## 2016-06-29 MED ORDER — SENNOSIDES-DOCUSATE SODIUM 8.6-50 MG PO TABS
1.0000 | ORAL_TABLET | Freq: Two times a day (BID) | ORAL | Status: DC
  Administered 2016-06-29 – 2016-07-03 (×8): 1 via ORAL
  Filled 2016-06-29 (×8): qty 1

## 2016-06-29 MED ORDER — FUROSEMIDE 10 MG/ML IJ SOLN
160.0000 mg | Freq: Four times a day (QID) | INTRAVENOUS | Status: DC
  Administered 2016-06-29 – 2016-06-30 (×5): 160 mg via INTRAVENOUS
  Filled 2016-06-29 (×6): qty 16

## 2016-06-29 MED ORDER — METOLAZONE 5 MG PO TABS
5.0000 mg | ORAL_TABLET | Freq: Once | ORAL | Status: AC
  Administered 2016-06-29: 5 mg via ORAL
  Filled 2016-06-29: qty 1

## 2016-06-29 MED ORDER — WARFARIN SODIUM 5 MG PO TABS
5.0000 mg | ORAL_TABLET | Freq: Once | ORAL | Status: AC
  Administered 2016-06-29: 5 mg via ORAL
  Filled 2016-06-29: qty 1

## 2016-06-29 MED ORDER — METOLAZONE 2.5 MG PO TABS: 2.5000 mg | ORAL_TABLET | Freq: Once | ORAL | Status: DC

## 2016-06-29 NOTE — Progress Notes (Signed)
ANTICOAGULATION CONSULT NOTE - Follow Up Consult  Pharmacy Consult for Heparin and Coumadin Indication: atrial fibrillation  Allergies  Allergen Reactions  . Lithostat [Acetohydroxamic Acid] Other (See Comments)    Unusual tiredness, weakness, nervousness, mental confusion, sore throat, Poor appetite    Patient Measurements: Height: 5\' 2"  (157.5 cm) Weight: 250 lb 3.6 oz (113.5 kg) IBW/kg (Calculated) : 50.1 Heparin Dosing Weight: 77kg  Vital Signs: Temp: 97.3 F (36.3 C) (02/08 0824) Temp Source: Oral (02/08 0824) BP: 131/42 (02/08 0900) Pulse Rate: 91 (02/08 0900)  Labs:  Recent Labs  06/27/16 0317 07/09/2016 0349 06/27/2016 1510 06/29/16 0240 06/29/16 0825  HGB 11.1* 11.2*  --  11.8*  --   HCT 35.1* 34.9*  --  36.9  --   PLT 209 194  --  210  --   LABPROT  --   --   --  14.0  --   INR  --   --   --  1.07  --   HEPARINUNFRC 0.37 0.14* 0.22* 0.54  --   CREATININE 2.68* 2.41*  --   --  2.45*    Estimated Creatinine Clearance: 18.6 mL/min (by C-G formula based on SCr of 2.45 mg/dL (H)).   Medications:  Heparin @ 1150 units/hr  Assessment: Christina Martin on xarelto pta for hx DVT, transitioned to heparin in setting of renal failure and new onset afib. Coumadin started yesterday. Heparin level is therapeutic at 0.54. INR below goal as expected after first dose of coumadin. CBC stable. No bleeding. Continues on IV amiodarone so will need to watch for drug interaction. S/P successful DCCV 2/7.   Goal of Therapy:  Heparin level 0.3-0.7 INR 2-3 Monitor platelets by anticoagulation protocol: Yes   Plan:  1) Continue heparin at 1150 units/hr 2) Repeat coumadin 5mg  x 1 3) Daily heparin level, INR, CBC  [name] 06/29/2016,9:53 AM

## 2016-06-29 NOTE — Progress Notes (Signed)
Advanced Heart Failure Rounding Note  PCP:  Primary Cardiologist: Dr. Angelena Form   Subjective:    Had TEE/DCCV yesterday, in NSR today and was in NSR overnight. No urine charted overnight, weight up one pound.   Says she does feel better, wants to get out of bed today. Remains on Amio gtt and heparin gtt. Started transition to warfarin yesterday. INR is 1.07 today.   Objective:   Weight Range: 250 lb 3.6 oz (113.5 kg) Body mass index is 45.77 kg/m.   Vital Signs:   Temp:  [98.1 F (36.7 C)-98.9 F (37.2 C)] 98.1 F (36.7 C) (02/08 0323) Pulse Rate:  [79-106] 82 (02/08 0323) Resp:  [16-28] 17 (02/08 0323) BP: (74-144)/(39-62) 125/50 (02/08 0323) SpO2:  [91 %-97 %] 97 % (02/08 0323) Weight:  [250 lb 3.6 oz (113.5 kg)] 250 lb 3.6 oz (113.5 kg) (02/08 0323) Last BM Date: 06/25/16  Weight change: Filed Weights   06/27/16 0338 07/06/2016 0500 06/29/16 0323  Weight: 249 lb 9 oz (113.2 kg) 249 lb 12.5 oz (113.3 kg) 250 lb 3.6 oz (113.5 kg)    Intake/Output:   Intake/Output Summary (Last 24 hours) at 06/29/16 0759 Last data filed at 06/29/16 [id number]  Gross per 24 hour  Intake          1305.32 ml  Output              850 ml  Net           455.32 ml     Physical Exam: General: Elderly female in no acute distress HEENT: normal Neck: supple. JVP to jaw . Carotids 2+ bilat; No  bruits. No lymphadenopathy or thyromegaly appreciated. Cor: PMI nondisplaced. Regular rate & rhythm. No rubs, gallops or murmurs. Lungs: basilar crackles  Abdomen: obese soft, nontender, nondistended. No hepatosplenomegaly. No bruits or masses. Good bowel sounds. Extremities: no cyanosis, clubbing, rash, 2-3+ pretibial edema Neuro: alert & oriented x3, cranial nerves grossly intact. moves all 4 extremities w/o difficulty. Affect pleasant   Telemetry: NSR, rates in the 80-90's. Rare PAC's.  Labs: CBC  Recent Labs  07/16/2016 0349 06/29/16 0240  WBC 9.0 9.7  HGB 11.2* 11.8*  HCT 34.9* 36.9  MCV  90.4 91.1  PLT 194 [id number]   Basic Metabolic Panel  Recent Labs  06/27/16 0317 06/27/2016 0349  NA 136 136  K 4.5 4.3  CL 95* 96*  CO2 29 28  GLUCOSE 112* 122*  BUN 58* 57*  CREATININE 2.68* 2.41*  CALCIUM 9.3 9.3       Medications:     Scheduled Medications: . docusate sodium  100 mg Oral Daily  . feeding supplement (ENSURE ENLIVE)  237 mL Oral BID BM  . furosemide  160 mg Intravenous Q8H  . midodrine  5 mg Oral TID WC  . multivitamin with minerals  1 tablet Oral Daily  . pantoprazole  40 mg Oral Daily  . sodium chloride flush  3 mL Intravenous Q12H  . sodium chloride irrigation  70 mL Irrigation See admin instructions  . warfarin   Does not apply Once  . Warfarin - Pharmacist Dosing Inpatient   Does not apply q1800    Infusions: . amiodarone 30 mg/hr (06/22/2016 2209)  . heparin 1,150 Units/hr (06/29/16 0507)    PRN Medications: acetaminophen, ALPRAZolam, hydrocortisone cream, lactulose, ondansetron **OR** ondansetron (ZOFRAN) IV, traMADol   Assessment/Plan   1. Acute on chronic diastolic CHF 2. Hypertensive heart disease 3. New onset atrial fibrillation     --  CHADSVASC 7 4. Acute on chronic CKD stage IV 5. History of DVT on chronic anticoagulation 6. Morbid obesity  Remains volume overloaded, weight up one pound but now in NSR. Lasix increased to 160mg  q 8 hours yesterday, has gotten 2 doses so far.   TEE/DC-CV yesterday, left atrium 4.5cm. EF 65%.   Continue heparin with plans to bridge to warfarin. No longer will be candidate for a DOAC due to renal insufficiency.   1.5 gm of Amio so far. BMP pending for this am.   Length of Stay: [place], NP  06/29/2016, 7:59 AM  Advanced Heart Failure Team Pager [phone] (M-F; [place])  Please contact [facility] for night-coverage after hours (4p -7a ) and weekends on [email or web address]  Patient seen and examined with Jettie Booze, NP. We discussed all aspects of the encounter. I agree with the assessment and  plan as stated above.   She is maintaining NSR after DCCV yesterday. Will continue IV amio today. Continue heparin and warfarin. Diuresis remains sluggish despite high-dose lasix. Discussed with Renal team at bedside. Will add a dose of metolazone today. Place TED hose. PT consult to mobilize. Watch renal function and electrolytes.   Javon Hupfer,MD 11:13 AM

## 2016-06-29 NOTE — Progress Notes (Signed)
Subjective: Interval History: Had difficulty with breathing overnight.   Objective: Vital signs in last 24 hours: Temp:  [97.3 F (36.3 C)-98.9 F (37.2 C)] 97.3 F (36.3 C) (02/08 0824) Pulse Rate:  [77-106] 80 (02/08 0824) Resp:  [16-28] 19 (02/08 0824) BP: (74-144)/(39-62) 130/53 (02/08 0824) SpO2:  [91 %-100 %] 100 % (02/08 0824) Weight:  [250 lb 3.6 oz (113.5 kg)] 250 lb 3.6 oz (113.5 kg) (02/08 0323) Weight change: 7.1 oz (0.2 kg)  Intake/Output from previous day: 02/07 0701 - 02/08 0700 In: 1361.7 [P.O.:460; I.V.:769.7; IV Piggyback:132] Out: 850 [Urine:850] Intake/Output this shift: Total I/O In: 28.2 [I.V.:28.2] Out: -   Gen: lying in bed, very sleepy  Neck: JVD present  CV: RRR. No murmurs appreciated.  Lungs: Bibasilar crackles. Increased WOB. [place] in place.  GI: soft, NTND  Ext: 2-3+ bilateral LE edema with overlying tight skin, venous stasis changes present of skin    Lab Results:  Recent Labs  06/22/2016 0349 06/29/16 0240  WBC 9.0 9.7  HGB 11.2* 11.8*  HCT 34.9* 36.9  PLT 194 210   BMET:   Recent Labs  06/27/16 0317 07/09/2016 0349  NA 136 136  K 4.5 4.3  CL 95* 96*  CO2 29 28  GLUCOSE 112* 122*  BUN 58* 57*  CREATININE 2.68* 2.41*  CALCIUM 9.3 9.3   No results for input(s): PTH in the last 72 hours. Iron Studies:  No results for input(s): IRON, TIBC, TRANSFERRIN, FERRITIN in the last 72 hours. Studies/Results: Dg Chest 2 View  Result Date: 06/27/2016 CLINICAL DATA:  Congestive heart failure, dyspnea EXAM: CHEST  2 VIEW COMPARISON:  CXR 03/26/2015 FINDINGS: The heart size is within normal limits for size. Aortic atherosclerosis without aneurysm is again seen. There is mild interstitial edema with posterior small pleural effusions, left greater than right. Surgical anchors project over the right humeral head with osteoarthritic spurring and sclerosis of the right glenohumeral joint. Degenerative changes are seen along the dorsal spine.  IMPRESSION: Interstitial edema with small posterior pleural effusions left greater than right. Aortic atherosclerosis. Electronically Signed   By: Ashley Royalty M.D.   On: 06/27/2016 15:21    I have reviewed the patient's current medications.  Assessment/Plan: Christina Martin is 83 y.o. female with PMH of chronic diastolic CHF, CKD4, neurogenic bladder with SPC in place,  And chronic DVT on Xarelto who was admitted with lower extremity swelling and weakness. Patient was found to be in Afib with RVR. She has received diuresis for an acute exacerbation of CHF. Found to have an AKI at admission that worsened, so nephrology became involved in her care.   1. AKI: Baseline Cr 1.6-1.8.  Increased Lasix to 160 mg q8h yesterday (patient has received two doses). Patient with only 850 cc of UOP yesterday and was net positive 500 cc yesterday. Weight is up by 1 lb.. Cr essentially unchanged. Patient remains volume overloaded.  Suspect low perfusion given persistent hypotension complicated by use of AceI at home. Will increase Lasix to 160 mg q6h. Give Metolazone 5mg  once.  2. Hypotension: With improvement in BP stability.  Requiring Midodrine. 3. Anemia: With low-normal Fe and saturation ratio of 13%. Would give Feraheme.  4. AFib with RVR: Rate controlled with amiodarone drip. IV heparin. S/p CV yesterday and remained in NSR overnight.  5. Acute CHF exacerbation: With volume excess. Diuresis. Will obtain CXR today. Given increased WOB and sleepiness, will obtain ABG.    LOS: 6 days   [name]  06/29/2016,8:49 AM   Renal Attending: I agree with note above.  Pt with apparent hypoventilation and lethargy on rounds and CXR revealed CHF and ABG hypercarbia; therefore BiPAP ordered. Furosemide increased to 160mg  Q 6 hours. Blondell Laperle C .

## 2016-06-29 NOTE — Progress Notes (Signed)
Pt rt arm red, hot, with +2 edema. Pt not complaining of pain. MD called and decided to reassess in am. Pt resting comfortably. Will continue to monitor.

## 2016-06-29 NOTE — Progress Notes (Signed)
Patient ID: Christina Martin, female   DOB: July 25, 1924, 83 y.o.   MRN: [id number]  PROGRESS NOTE    REHAB DIERSEN  [id number] DOB: 07/18/1924 DOA: 07/08/2016  PCP: [name], MD   Brief Narrative:  83 y.o.femalewith medical history significant for chronic diastolic CHF (last 2 D ECHO in 03/2015 showed EF 65% and grade 1 DD), CKD stage 4 with baseline creatinine 1.80 back in 03/2015, hypertension, GERD. She presented to [facility] with worsening lower extremity swelling and difficulty ambulating over past 2 weeks prior to this admission. She also had shortness of breath with exertion. She took extra dose of lasix for past 1 week without significant symptomatic relief.  Pt was hemodynamically stable. Her blood work showed potassium of 5.2, Cr 2.15, troponin 0.04. The 12 lead EKG showed atrial fibrillation. She was started on IV lasix 60 mg IV Q 12 hours. Cardio seen in consultation. Due to renal insufficiency nephrology has been consulted as well.    Assessment & Plan:   Lower extremity edema /Acute on chronic diastolic heart failure (HCC) - ECHO with a EF of 60-65%, no wall motion abnormalities, moderate left atrial enlargement and severe RAE, moderate TR IVC dilated. - Now on high dose IV lasix -160mg  Q8 - continue milrinone - expect improvement in diuresis with restoration of sinus rhythm    New onset atrial fibrillation - CHADS vasc score 5, continue amiodarone drip - Initially on xarelto but this was placed on hold due to renal insufficiency so she is on heparin drip - s/p cardioversion, now in NSR    Hx of Left leg DVT 08-2014 - Decreased the dose to 10 mg a day by hematology and she was supposed to be on it through 08/2016.  - Xarelto on hold due to renal insufficiency  - On heparin drip  - Lower extremity Doppler negative for DVT  Essential hypertension - Lisinopril on hold due to renal insufficiency  - On IV lasix, midodrine   Hyperkalemia -  improved  Elevated troponin - Likely demand ischemia due to CKD, acute CHF - Troponin 0.04, 0.05 and 0.04 - No plans for ischemic workup at this time  GERD without esophagitis - Continue protonix   Acute renal failure superimposed on stage 4 chronic kidney disease (HCC) - Cr baseline is 1.6 on 02/2016 and on this admission 2.15 which has worsened to 2.68 this am - Renal ultrasound with mild right renal atrophy. No hydronephrosis or renal obstruction noted. - Per renal, likely low perfusion in setting of ACEi, AIN - on IV lasix per Renal  Anemia of chronic kidney disease - stable, monitor  DVT prophylaxis: On heparin drip  Code Status: DNR/DNI Family Communication: Family at the bedside this am Disposition Plan: Home versus SNF once cleared by renal and cards; still on IV lasix     Consultants:   Cardiology: Dr Debara Pickett 06/24/2016  Renal  Procedures:   Renal ultrasound 06/25/2016  Lower extremity dopplers 06/24/2016  2-D echo 06/24/2016 - EF 60%  Antimicrobials:   None   Subjective: Feels better, breathing improving  Objective: Vitals:   06/29/16 1100 06/29/16 1219 06/29/16 1253 06/29/16 1311  BP: (!) 109/39  (!) 123/53 121/61  Pulse: 85  85 80  Resp: (!) 21  (!) 21 13  Temp:   97.4 F (36.3 C)   TempSrc:   Axillary   SpO2: (!) 85% 92% 91% 94%  Weight:      Height:  Intake/Output Summary (Last 24 hours) at 06/29/16 1434 Last data filed at 06/29/16 1100  Gross per 24 hour  Intake          1381.28 ml  Output              625 ml  Net           756.28 ml   Filed Weights   06/27/16 0338 07/19/2016 0500 06/29/16 0323  Weight: 113.2 kg (249 lb 9 oz) 113.3 kg (249 lb 12.5 oz) 113.5 kg (250 lb 3.6 oz)    Examination:  General exam: AAOx3, no distress Respiratory system: few basilar crackles Cardiovascular system: S1 & S2 heard, slight tachycardia  Gastrointestinal system: A(+) BS, non tender abd  Central nervous system: No focal  neurological deficits. Extremities: +2 LE pitting edema, redness of bilateral extremities with chronic venous skin changes  Skin: Skin is warm and dry   Psychiatry: Normal mood and behavior   Data Reviewed: I have personally reviewed following labs and imaging studies  CBC:  Recent Labs Lab 06/25/16 0409 06/25/16 1412 06/26/16 0320 06/27/16 0317 07/08/2016 0349 06/29/16 0240  WBC 6.7  --  8.5 7.6 9.0 9.7  NEUTROABS  --  4.3  --   --   --   --   HGB 10.9*  --  11.0* 11.1* 11.2* 11.8*  HCT 34.2*  --  34.8* 35.1* 34.9* 36.9  MCV 90.7  --  90.6 90.9 90.4 91.1  PLT 222  --  221 209 194 [id number]   Basic Metabolic Panel:  Recent Labs Lab 07/13/2016 1841  06/25/16 1256 06/26/16 0320 06/27/16 0317 07/09/2016 0349 06/29/16 0825  NA 136  < > 137 136 136 136 135  K 5.2*  < > 4.8 4.4 4.5 4.3 4.3  CL 101  < > 99* 99* 95* 96* 94*  CO2 29  < > 29 29 29 28 28   GLUCOSE 141*  < > 111* 102* 112* 122* 135*  BUN 43*  < > 50* 51* 58* 57* 60*  CREATININE 2.15*  < > 2.54* 2.65* 2.68* 2.41* 2.45*  CALCIUM 9.8  < > 9.5 8.9 9.3 9.3 9.4  MG 2.3  --   --  2.1  --   --   --   PHOS 2.8  --   --  4.6  --   --   --   < > = values in this interval not displayed. GFR: Estimated Creatinine Clearance: 18.6 mL/min (by C-G formula based on SCr of 2.45 mg/dL (H)). Liver Function Tests:  Recent Labs Lab 07/13/2016 1841 06/26/16 0320  AST 26 32  ALT 22 26  ALKPHOS 83 86  BILITOT 0.7 0.5  PROT 6.3* 5.9*  ALBUMIN 3.2* 2.9*   No results for input(s): LIPASE, AMYLASE in the last 168 hours. No results for input(s): AMMONIA in the last 168 hours. Coagulation Profile:  Recent Labs Lab 06/30/2016 1841 06/29/16 0240  INR 1.13 1.07   Cardiac Enzymes:  Recent Labs Lab 07/08/2016 1841 06/24/16 0005 06/24/16 0904  TROPONINI 0.04* 0.05* 0.04*   BNP (last 3 results) No results for input(s): PROBNP in the last 8760 hours. HbA1C: No results for input(s): HGBA1C in the last 72 hours. CBG:  Recent Labs Lab  07/09/2016 2135 06/25/16 2158 06/26/16 0825  GLUCAP 143* 114* 99   Lipid Profile: No results for input(s): CHOL, HDL, LDLCALC, TRIG, CHOLHDL, LDLDIRECT in the last 72 hours. Thyroid Function Tests: No results for input(s): TSH, T4TOTAL, FREET4,  T3FREE, THYROIDAB in the last 72 hours. Anemia Panel: No results for input(s): VITAMINB12, FOLATE, FERRITIN, TIBC, IRON, RETICCTPCT in the last 72 hours. Urine analysis:    Component Value Date/Time   COLORURINE YELLOW 06/28/2016 1834   APPEARANCEUR CLOUDY (A) 06/24/2016 1834   LABSPEC 1.013 07/07/2016 1834   PHURINE 5.0 07/18/2016 1834   GLUCOSEU NEGATIVE 07/07/2016 1834   HGBUR SMALL (A) 07/16/2016 1834   BILIRUBINUR NEGATIVE 07/12/2016 1834   KETONESUR NEGATIVE 07/06/2016 1834   PROTEINUR NEGATIVE 07/16/2016 1834   NITRITE NEGATIVE 07/12/2016 1834   LEUKOCYTESUR LARGE (A) 06/25/2016 1834   Sepsis Labs: @LABRCNTIP (procalcitonin:4,lacticidven:4)    Urine culture     Status: Abnormal   Collection Time: 06/25/16  8:16 AM  Result Value Ref Range Status   Specimen Description URINE, SUPRAPUBIC  Final   Special Requests NONE  Final   Culture MULTIPLE SPECIES PRESENT, SUGGEST RECOLLECTION (A)  Final   Report Status 06/26/2016 FINAL  Final  MRSA PCR Screening     Status: None   Collection Time: 06/25/16  5:07 PM  Result Value Ref Range Status   MRSA by PCR NEGATIVE NEGATIVE Final      Radiology Studies: US Renal Result Date: 06/25/2016 Mild right renal atrophy. No hydronephrosis or renal obstruction is noted. Electronically Signed   By: Marijo Conception, M.D.   On: 06/25/2016 08:57     Scheduled Meds: . docusate sodium  100 mg Oral Daily  . feeding supplement   237 mL Oral BID BM  . furosemide  100 mg Intravenous Q8H  . midodrine  5 mg Oral TID WC  . multivitamin with min  1 tablet Oral Daily  . pantoprazole  40 mg Oral Daily   Continuous Infusions: . amiodarone 30 mg/hr (06/29/16 0921)  . heparin 1,150 Units/hr (06/29/16  0507)     LOS: 6 days    Time spent: 35 minutes  Greater than 50% of the time spent on counseling and coordinating the care.   Domenic Polite, MD [facility] Pager [phone]  If 7PM-7AM, please contact night-coverage [email or web address] Password [facility] 06/29/2016, 2:34 PM

## 2016-06-30 ENCOUNTER — Encounter (HOSPITAL_COMMUNITY): Payer: Self-pay | Admitting: Interventional Radiology

## 2016-06-30 ENCOUNTER — Inpatient Hospital Stay (HOSPITAL_COMMUNITY): Payer: Medicare Other

## 2016-06-30 DIAGNOSIS — J9601 Acute respiratory failure with hypoxia: Secondary | ICD-10-CM

## 2016-06-30 DIAGNOSIS — J9602 Acute respiratory failure with hypercapnia: Secondary | ICD-10-CM

## 2016-06-30 HISTORY — PX: IR GENERIC HISTORICAL: IMG1180011

## 2016-06-30 LAB — CBC
HCT: 36.6 % (ref 36.0–46.0)
HEMOGLOBIN: 11.5 g/dL — AB (ref 12.0–15.0)
MCH: 28.8 pg (ref 26.0–34.0)
MCHC: 31.4 g/dL (ref 30.0–36.0)
MCV: 91.7 fL (ref 78.0–100.0)
PLATELETS: 215 10*3/uL (ref 150–400)
RBC: 3.99 MIL/uL (ref 3.87–5.11)
RDW: 15.8 % — ABNORMAL HIGH (ref 11.5–15.5)
WBC: 11.7 10*3/uL — ABNORMAL HIGH (ref 4.0–10.5)

## 2016-06-30 LAB — PROTIME-INR
INR: 1.08
Prothrombin Time: 14.1 s (ref 11.4–15.2)

## 2016-06-30 LAB — BASIC METABOLIC PANEL
ANION GAP: 13 (ref 5–15)
BUN: 67 mg/dL — ABNORMAL HIGH (ref 6–20)
CALCIUM: 9.5 mg/dL (ref 8.9–10.3)
CO2: 29 mmol/L (ref 22–32)
CREATININE: 2.62 mg/dL — AB (ref 0.44–1.00)
Chloride: 91 mmol/L — ABNORMAL LOW (ref 101–111)
GFR calc Af Amer: 18 mL/min — ABNORMAL LOW (ref >60)
GFR, EST NON AFRICAN AMERICAN: 15 mL/min — AB (ref >60)
GLUCOSE: 156 mg/dL — AB (ref 65–99)
Potassium: 4 mmol/L (ref 3.5–5.1)
Sodium: 133 mmol/L — ABNORMAL LOW (ref 135–145)

## 2016-06-30 LAB — HEPARIN LEVEL (UNFRACTIONATED): Heparin Unfractionated: 0.71 IU/mL — ABNORMAL HIGH (ref 0.30–0.70)

## 2016-06-30 MED ORDER — MORPHINE SULFATE (PF) 2 MG/ML IV SOLN
1.0000 mg | INTRAVENOUS | Status: DC | PRN
  Administered 2016-06-30 – 2016-07-02 (×5): 1 mg via INTRAVENOUS
  Filled 2016-06-30 (×5): qty 1

## 2016-06-30 MED ORDER — WARFARIN SODIUM 7.5 MG PO TABS: 7.5000 mg | ORAL_TABLET | Freq: Once | ORAL | Status: DC

## 2016-06-30 MED ORDER — SORBITOL 70 % SOLN
30.0000 mL | Freq: Once | Status: AC
  Administered 2016-06-30: 30 mL via ORAL
  Filled 2016-06-30: qty 30

## 2016-06-30 MED ORDER — HEPARIN SODIUM (PORCINE) 1000 UNIT/ML IJ SOLN
INTRAMUSCULAR | Status: AC
  Filled 2016-06-30: qty 1

## 2016-06-30 MED ORDER — HEPARIN (PORCINE) IN NACL 100-0.45 UNIT/ML-% IJ SOLN
1000.0000 [IU]/h | INTRAMUSCULAR | Status: DC
  Administered 2016-07-01 – 2016-07-02 (×3): 1000 [IU]/h via INTRAVENOUS
  Filled 2016-06-30 (×3): qty 250

## 2016-06-30 MED ORDER — LIDOCAINE-EPINEPHRINE (PF) 1 %-1:200000 IJ SOLN
INTRAMUSCULAR | Status: AC
  Filled 2016-06-30: qty 30

## 2016-06-30 MED ORDER — NEPRO/CARBSTEADY PO LIQD
237.0000 mL | Freq: Three times a day (TID) | ORAL | Status: DC
  Administered 2016-07-01 – 2016-07-03 (×7): 237 mL via ORAL
  Filled 2016-06-30 (×13): qty 237

## 2016-06-30 MED ORDER — LIDOCAINE HCL (PF) 1 % IJ SOLN
INTRAMUSCULAR | Status: DC | PRN
Start: 2016-06-30 — End: 2016-07-03
  Administered 2016-06-30: 5 mL

## 2016-06-30 MED ORDER — NEPRO/CARBSTEADY PO LIQD: 237.0000 mL | Freq: Two times a day (BID) | ORAL | Status: DC

## 2016-06-30 MED ORDER — TRAMADOL HCL 50 MG PO TABS
ORAL_TABLET | ORAL | Status: AC
  Administered 2016-07-01: 10:00:00
  Filled 2016-06-30: qty 1

## 2016-06-30 NOTE — Progress Notes (Signed)
Pt returning from hemodialysis. Pt was reported to be in SR-ST from both dayshift RN on this department as well as from hemodialysis RN. Pt however is in afib with rates in the 110's upon arrival back to room. Pt is in discomfort from constipation and promotes feeling poorly in general however etiology is not clear to one particular causative factor. Pt does not appear to be SOB and sats are maintaining well with 4 liters [place] at 95%. BP is a little soft at 93/54 with a MAP of 66. Cardiology fellow on call notified of pt's rhythm change and a 12 lead EKG will be performed for confirmation purposes. No other orders given at this time. Will continue to monitor pt and will page MD again if pt condition worsens.

## 2016-06-30 NOTE — Consult Note (Signed)
   [facility] CM Inpatient Consult   06/30/2016  Christina Martin 08-Apr-1925 [id number]   Call from inpatient RNCM to alert that this patient is eligible for [facility] Care Management services for post hospital needs.  The patient chart review that the patient is an 83 y.o.femalewith medical history significant for chronic diastolic CHF (last 2 D ECHO in 03/2015 showed EF 65% and grade 1 DD), CKD stage 4 with baseline creatinine 1.80 back in 03/2015, hypertension, GERD.  Also, currently the patient is being considered for inpatient rehab. Will follow up for disposition if changes. Patient off the floor at this time.   For questions, please contact:  Natividad Brood, RN BSN [facility] Liaison  [phone] business mobile phone Toll free office [phone]

## 2016-06-30 NOTE — Procedures (Signed)
Pre-procedure Diagnosis: ESRD Post-procedure Diagnosis: Same  Successful placement of non-tunneled HD catheter with tips terminating within the superior aspect of the right atrium.    Complications: None Immediate  EBL: Minimal   The catheter is ready for immediate use.   Jay Linetta Regner, MD Pager #: [phone]   

## 2016-06-30 NOTE — Progress Notes (Signed)
To IR for hd cath insertion, report given to RN.

## 2016-06-30 NOTE — Progress Notes (Signed)
Patient ID: Christina Martin, female   DOB: [birth year], 83 y.o.   MRN: [id number]  PROGRESS NOTE    Christina Martin  [id number] DOB: [birth year] DOA: 07/07/2016  PCP: [name], MD   Brief Narrative:  83 y.o.femalewith medical history significant for chronic diastolic CHF (last 2 D ECHO in 03/2015 showed EF 65% and grade 1 DD), CKD stage 4 with baseline creatinine 1.80 back in 03/2015, hypertension, GERD. She presented to [facility] with worsening lower extremity swelling and difficulty ambulating over past 2 weeks prior to this admission. She also had shortness of breath with exertion. She took extra dose of lasix for past 1 week without significant symptomatic relief.  Pt was hemodynamically stable. Her blood work showed potassium of 5.2, Cr 2.15, troponin 0.04. The 12 lead EKG showed atrial fibrillation. She was started on IV lasix 60 mg IV Q 12 hours. Cardio seen in consultation. Due to renal insufficiency nephrology has been consulted as well.    Assessment & Plan:   Acute on chronic diastolic heart failure  And AKI - ECHO with a EF of 60-65%, no wall motion abnormalities, moderate left atrial enlargement and severe RAE, moderate TR IVC dilated. -on milrinone and high dose IV lasix -160mg  Q8, urine output sluggish, Renal considering UF with HD catheter -she would be a very poor long term HD candidate, palliative following and needs further discussion before long term HD plans made, I discussed this with son and daughter  -Acute renal failure superimposed on stage 4 chronic kidney disease ([place]) - Cr baseline is 1.6 on 02/2016 and on this admission 2.15 which has worsened to 2.68 this am - Renal ultrasound with mild right renal atrophy. No hydronephrosis or renal obstruction noted. - see discussion above regarding ultrafiltration/HD    New onset atrial fibrillation - CHADS vasc score 5, continue amiodarone drip - Initially on xarelto but this was placed on hold due to renal  insufficiency so she is on heparin drip - s/p cardioversion, now in NSR    Hx of Left leg DVT 08-2014 - Decreased the dose to 10 mg a day by hematology and she was supposed to be on it through 08/2016.  - Xarelto on hold due to renal insufficiency  - On heparin drip  - Lower extremity Doppler negative for DVT  Essential hypertension - Lisinopril on hold due to renal insufficiency  - On IV lasix, midodrine   Hyperkalemia - improved  Elevated troponin - Likely demand ischemia due to CKD, acute CHF - Troponin 0.04, 0.05 and 0.04 - No plans for ischemic workup at this time  GERD without esophagitis - Continue protonix   Anemia of chronic kidney disease - stable, monitor  DVT prophylaxis: On heparin drip  Code Status: DNR/DNI Family Communication: Family at the bedside this am Disposition Plan: SNF when stable   Consultants:   Cardiology: Dr Debara Pickett 06/24/2016  Renal  Procedures:   Renal ultrasound 06/25/2016  Lower extremity dopplers 06/24/2016  2-D echo 06/24/2016 - EF 60%  Antimicrobials:   None   Subjective: Feels ok, no new complaints  Objective: Vitals:   06/30/16 0013 06/30/16 0330 06/30/16 0739 06/30/16 1110  BP: (!) 137/51 (!) 121/52 (!) 117/46 (!) 131/51  Pulse:  78 80 89  Resp: 18 18 19  (!) 22  Temp: 97.8 F (36.6 C) 97.6 F (36.4 C) 97.4 F (36.3 C) 97.3 F (36.3 C)  TempSrc: Oral Oral Oral Oral  SpO2: 95% 95% 92% 95%  Weight:  117 kg (257 lb 15 oz)    Height:        Intake/Output Summary (Last 24 hours) at 06/30/16 1147 Last data filed at 06/30/16 1000  Gross per 24 hour  Intake             1516 ml  Output              300 ml  Net             1216 ml   Filed Weights   06/24/2016 0500 06/29/16 0323 06/30/16 0330  Weight: 113.3 kg (249 lb 12.5 oz) 113.5 kg (250 lb 3.6 oz) 117 kg (257 lb 15 oz)    Examination:  General exam: AAOx3, no distress Respiratory system: few basilar crackles Cardiovascular system: S1 &  S2 heard, slight tachycardia  Gastrointestinal system: A(+) BS, non tender abd  Central nervous system: No focal neurological deficits. Extremities: +2 LE pitting edema, redness of bilateral extremities with chronic venous skin changes  Skin: Skin is warm and dry   Psychiatry: Normal mood and behavior   Data Reviewed: I have personally reviewed following labs and imaging studies  CBC:  Recent Labs Lab 06/25/16 1412 06/26/16 0320 06/27/16 0317 07/03/2016 0349 06/29/16 0240 06/30/16 0258  WBC  --  8.5 7.6 9.0 9.7 11.7*  NEUTROABS 4.3  --   --   --   --   --   HGB  --  11.0* 11.1* 11.2* 11.8* 11.5*  HCT  --  34.8* 35.1* 34.9* 36.9 36.6  MCV  --  90.6 90.9 90.4 91.1 91.7  PLT  --  221 209 194 210 123456   Basic Metabolic Panel:  Recent Labs Lab 07/13/2016 1841  06/26/16 0320 06/27/16 0317 07/06/2016 0349 06/29/16 0825 06/30/16 0258  NA 136  < > 136 136 136 135 133*  K 5.2*  < > 4.4 4.5 4.3 4.3 4.0  CL 101  < > 99* 95* 96* 94* 91*  CO2 29  < > 29 29 28 28 29   GLUCOSE 141*  < > 102* 112* 122* 135* 156*  BUN 43*  < > 51* 58* 57* 60* 67*  CREATININE 2.15*  < > 2.65* 2.68* 2.41* 2.45* 2.62*  CALCIUM 9.8  < > 8.9 9.3 9.3 9.4 9.5  MG 2.3  --  2.1  --   --   --   --   PHOS 2.8  --  4.6  --   --   --   --   < > = values in this interval not displayed. GFR: Estimated Creatinine Clearance: 17.7 mL/min (by C-G formula based on SCr of 2.62 mg/dL (H)). Liver Function Tests:  Recent Labs Lab 06/26/2016 1841 06/26/16 0320  AST 26 32  ALT 22 26  ALKPHOS 83 86  BILITOT 0.7 0.5  PROT 6.3* 5.9*  ALBUMIN 3.2* 2.9*   No results for input(s): LIPASE, AMYLASE in the last 168 hours. No results for input(s): AMMONIA in the last 168 hours. Coagulation Profile:  Recent Labs Lab 07/15/2016 1841 06/29/16 0240 06/30/16 0258  INR 1.13 1.07 1.08   Cardiac Enzymes:  Recent Labs Lab 06/27/2016 1841 06/24/16 0005 06/24/16 0904  TROPONINI 0.04* 0.05* 0.04*   BNP (last 3 results) No results  for input(s): PROBNP in the last 8760 hours. HbA1C: No results for input(s): HGBA1C in the last 72 hours. CBG:  Recent Labs Lab 07/08/2016 2135 06/25/16 2158 06/26/16 0825  GLUCAP 143* 114* 99   Lipid Profile:  No results for input(s): CHOL, HDL, LDLCALC, TRIG, CHOLHDL, LDLDIRECT in the last 72 hours. Thyroid Function Tests: No results for input(s): TSH, T4TOTAL, FREET4, T3FREE, THYROIDAB in the last 72 hours. Anemia Panel: No results for input(s): VITAMINB12, FOLATE, FERRITIN, TIBC, IRON, RETICCTPCT in the last 72 hours. Urine analysis:    Component Value Date/Time   COLORURINE YELLOW 06/22/2016 1834   APPEARANCEUR CLOUDY (A) 06/26/2016 1834   LABSPEC 1.013 06/25/2016 1834   PHURINE 5.0 07/10/2016 1834   GLUCOSEU NEGATIVE 07/08/2016 1834   HGBUR SMALL (A) 07/12/2016 1834   BILIRUBINUR NEGATIVE 07/13/2016 1834   KETONESUR NEGATIVE 07/13/2016 1834   PROTEINUR NEGATIVE 07/16/2016 1834   NITRITE NEGATIVE 07/07/2016 1834   LEUKOCYTESUR LARGE (A) 07/14/2016 1834   Sepsis Labs: @LABRCNTIP (procalcitonin:4,lacticidven:4)    Urine culture     Status: Abnormal   Collection Time: 06/25/16  8:16 AM  Result Value Ref Range Status   Specimen Description URINE, SUPRAPUBIC  Final   Special Requests NONE  Final   Culture MULTIPLE SPECIES PRESENT, SUGGEST RECOLLECTION (A)  Final   Report Status 06/26/2016 FINAL  Final  MRSA PCR Screening     Status: None   Collection Time: 06/25/16  5:07 PM  Result Value Ref Range Status   MRSA by PCR NEGATIVE NEGATIVE Final      Radiology Studies: US Renal Result Date: 06/25/2016 Mild right renal atrophy. No hydronephrosis or renal obstruction is noted. Electronically Signed   By: Marijo Conception, M.D.   On: 06/25/2016 08:57     Scheduled Meds: . docusate sodium  100 mg Oral Daily  . feeding supplement   237 mL Oral BID BM  . furosemide  100 mg Intravenous Q8H  . midodrine  5 mg Oral TID WC  . multivitamin with min  1 tablet Oral Daily  .  pantoprazole  40 mg Oral Daily   Continuous Infusions: . amiodarone 30 mg/hr (06/30/16 1000)  . heparin 1,000 Units/hr (06/30/16 1000)     LOS: 7 days    Time spent: 35 minutes  Greater than 50% of the time spent on counseling and coordinating the care.   Domenic Polite, MD [facility] Pager [phone]  If 7PM-7AM, please contact night-coverage [email or web address] Password [email or web address] 06/30/2016, 11:47 AM

## 2016-06-30 NOTE — Progress Notes (Signed)
Nutrition Follow-up  DOCUMENTATION CODES:   Morbid obesity  INTERVENTION:   -D/c Ensure Enlive po BID, each supplement provides 350 kcal and 20 grams of protein -D/c MVI -Nepro Shake po TID, each supplement provides 425 kcal and 19 grams protein  NUTRITION DIAGNOSIS:   Inadequate oral intake related to poor appetite as evidenced by per patient/family report.  Ongoing  GOAL:   Patient will meet greater than or equal to 90% of their needs  Progressing  MONITOR:   PO intake, Supplement acceptance, I & O's, Labs  REASON FOR ASSESSMENT:   Consult Diet education  ASSESSMENT:   83 y.o. female with medical history significant for chronic diastolic CHF (last 2 D ECHO in 03/2015 showed EF 65% and grade 1 DD), CKD stage 4 with baseline creatinine 1.80 back in 03/2015, hypertension, GERD. She presented to [facility] with worsening lower extremity swelling and difficulty ambulating over past 2 weeks prior to this admission. She also had shortness of breath with exertion. Admitted for CHF  Pt receiving nursing care at time of visit.   Pt underwent HD cath placement this AM; plan to start HD later this afternoon.   Per SLP, pt diet downgraded to full liquids only when alert due to lethargy. Meal completion variable; PO: 10-100%. RD will add Nepro supplements related to HD and increased nutrient density to optimize calorie and protein intake.   Per MD notes, pt is a poor long term HD candidate. Palliative Care Team following for goals of care discussion.   Given lethargy and pending [place] discussions, pt is not appropriate for education at this time. RD will address education needs once goals of care are more firmly established.   Labs reviewed: Na: 133.   Diet Order:  Diet full liquid Room service appropriate? Yes; Fluid consistency: Thin  Skin:  Reviewed, no issues  Last BM:  2/4  Height:   Ht Readings from Last 1 Encounters:  06/29/2016 5\' 2"  (1.575 m)     Weight:   Wt Readings from Last 1 Encounters:  06/30/16 257 lb 15 oz (117 kg)    Ideal Body Weight:  50 kg  BMI:  Body mass index is 47.18 kg/m.  Estimated Nutritional Needs:   Kcal:  1550-1750  Protein:  75-90 grams  Fluid:  per MD  EDUCATION NEEDS:   No education needs identified at this time  Christina Martin A. Jimmye Norman, RD, LDN, CDE Pager: [phone] After hours Pager: [phone]

## 2016-06-30 NOTE — Evaluation (Signed)
Physical Therapy Evaluation Patient Details Name: Christina Martin MRN: [id number] DOB: 20-May-1925 Today's Date: 06/30/2016   History of Present Illness  83 y.o. female with medical history significant for chronic diastolic CHF (last 2 D ECHO in 03/2015 showed EF 65% and grade 1 DD), CKD stage 4 with baseline creatinine 1.80 back in 03/2015, hypertension, GERD. She presented to [facility] with worsening lower extremity swelling and difficulty ambulating over past 2 weeks prior to this admission. She also had shortness of breath with exertion.   Clinical Impression  Pt admitted with above diagnosis. Pt currently with functional limitations due to the deficits listed below (see PT Problem List). Pt was able to sit EOB for 10 min with min guard assist with good balance.  Unable to stand with 2 attempts due to bil LE pain. Will follow acutely and progress as able but may be limited due to non-tunneled dialysis cath.  Will f/u Monday and treat as able.   Feel once stable, pt is a REhab candidate with excellent family support of 24 hours day prn. Pt will benefit from skilled PT to increase their independence and safety with mobility to allow discharge to the venue listed below.      Follow Up Recommendations CIR;Supervision/Assistance - 24 hour    Equipment Recommendations  None recommended by PT    Recommendations for Other Services Rehab consult     Precautions / Restrictions Precautions Precautions: Fall Restrictions Weight Bearing Restrictions: No      Mobility  Bed Mobility Overal bed mobility: Needs Assistance Bed Mobility: Supine to Sit     Supine to sit: Mod assist;+2 for physical assistance     General bed mobility comments: Pt needed assist for LES and for elevation of trunk.  Transfers Overall transfer level: Needs assistance Equipment used: Rolling walker (2 wheeled) Transfers: Sit to/from Stand Sit to Stand: Max assist;Total assist;+2 physical assistance          General transfer comment: Pt was unable to stand fully upright due to bil LE pain.  Pt cleared bottom off bed but could not stand fully upright.   Ambulation/Gait             General Gait Details: unable at this time  Stairs            Wheelchair Mobility    Modified Rankin (Stroke Patients Only)       Balance Overall balance assessment: Needs assistance;History of Falls Sitting-balance support: No upper extremity supported;Feet supported Sitting balance-Leahy Scale: Fair     Standing balance support: Bilateral upper extremity supported;During functional activity Standing balance-Leahy Scale: Poor Standing balance comment: relies on UE support for balance.                              Pertinent Vitals/Pain Pain Assessment: Faces Faces Pain Scale: Hurts even more Pain Location: bil LEs and "all over" Pain Descriptors / Indicators: Aching;Grimacing;Guarding;Sore Pain Intervention(s): Limited activity within patient's tolerance;Monitored during session;Premedicated before session;Repositioned  Pt on 5LNc with sats 92-95%; 128/60, 79 bpm.   Home Living Family/patient expects to be discharged to:: Private residence Living Arrangements: Alone Available Help at Discharge: Family;Available 24 hours/day (aide 3 hours day, 4days week; son/daughter can assist) Type of Home: House Home Access: Stairs to enter Entrance Stairs-Rails: None Entrance Stairs-Number of Steps: 1 +1 Home Layout: One level Home Equipment: Walker - 2 wheels;Bedside commode;Shower seat (Lift chair that she sleeps in.)  Prior Function Level of Independence: Needs assistance   Gait / Transfers Assistance Needed: Modif I with RW   ADL's / Homemaking Assistance Needed: Neighbor asssists with ADLs, cooking, cleaning, bathing, dresssing, meds.          Hand Dominance        Extremity/Trunk Assessment   Upper Extremity Assessment Upper Extremity Assessment: Defer to OT  evaluation    Lower Extremity Assessment Lower Extremity Assessment: RLE deficits/detail;LLE deficits/detail RLE Deficits / Details: grossly 2+5 LLE Deficits / Details: grossly 2+/5    Cervical / Trunk Assessment Cervical / Trunk Assessment: Kyphotic  Communication   Communication: No difficulties  Cognition Arousal/Alertness: Awake/alert Behavior During Therapy: WFL for tasks assessed/performed Overall Cognitive Status: Within Functional Limits for tasks assessed                      General Comments General comments (skin integrity, edema, etc.): IR came to take pt for catheter placement.    Exercises General Exercises - Lower Extremity Ankle Circles/Pumps: AROM;Both;5 reps;Seated Long Arc Quad: AROM;Both;5 reps;Seated   Assessment/Plan    PT Assessment Patient needs continued PT services  PT Problem List Decreased activity tolerance;Decreased balance;Decreased mobility;Decreased strength;Decreased range of motion;Decreased knowledge of use of DME;Decreased safety awareness;Decreased knowledge of precautions;Cardiopulmonary status limiting activity;Pain;Decreased skin integrity;Obesity          PT Treatment Interventions DME instruction;Gait training;Functional mobility training;Therapeutic activities;Therapeutic exercise;Balance training;Patient/family education    PT Goals (Current goals can be found in the Care Plan section)  Acute Rehab PT Goals Patient Stated Goal: to go home PT Goal Formulation: With patient Time For Goal Achievement: 07/14/16 Potential to Achieve Goals: Good    Frequency Min 3X/week   Barriers to discharge        Co-evaluation               End of Session Equipment Utilized During Treatment: Gait belt;Oxygen Activity Tolerance: Patient limited by fatigue;Patient limited by pain Patient left: with call bell/phone within reach;in bed;with family/visitor present Nurse Communication: Mobility status;Need for lift equipment          Time: [id number] PT Time Calculation (min) (ACUTE ONLY): 15 min   Charges:   PT Evaluation $PT Eval Moderate Complexity: 1 Procedure     PT G Codes:        Christina Martin 07/01/2016, 1:17 PM [employer] Acute Rehabilitation [phone] (pager)

## 2016-06-30 NOTE — Progress Notes (Addendum)
ANTICOAGULATION CONSULT NOTE - Follow Up Consult  Pharmacy Consult for Heparin and Coumadin Indication: atrial fibrillation  Allergies  Allergen Reactions  . Lithostat [Acetohydroxamic Acid] Other (See Comments)    Unusual tiredness, weakness, nervousness, mental confusion, sore throat, Poor appetite    Patient Measurements: Height: 5\' 2"  (157.5 cm) Weight: 257 lb 15 oz (117 kg) IBW/kg (Calculated) : 50.1 Heparin Dosing Weight: 77kg  Vital Signs: Temp: 97.6 F (36.4 C) (02/09 0330) Temp Source: Oral (02/09 0330) BP: 121/52 (02/09 0330) Pulse Rate: 78 (02/09 0330)  Labs:  Recent Labs  06/27/2016 0349 06/24/2016 1510 06/29/16 0240 06/29/16 0825 06/30/16 0258  HGB 11.2*  --  11.8*  --  11.5*  HCT 34.9*  --  36.9  --  36.6  PLT 194  --  210  --  215  LABPROT  --   --  14.0  --  14.1  INR  --   --  1.07  --  1.08  HEPARINUNFRC 0.14* 0.22* 0.54  --  0.71*  CREATININE 2.41*  --   --  2.45*  --     Estimated Creatinine Clearance: 18.9 mL/min (by C-G formula based on SCr of 2.45 mg/dL (H)).   Medications:  Heparin @ 1150 units/hr  Assessment: 89yof on xarelto pta for hx DVT, transitioned to heparin in setting of renal failure and new onset afib. Coumadin started yesterday. Heparin level now up to slightly supratherapeutic at 0.71. INR no movement past first 2 doses of coumadin. CBC stable, no bleeding noted.  Goal of Therapy:  Heparin level 0.3-0.7 INR 2-3 Monitor platelets by anticoagulation protocol: Yes   Plan:  Decrease heparin to 1050 units/hr Coumadin 7.5mg  x 1 Daily heparin level, INR, CBC  Sherlon Handing, PharmD, BCPS Clinical pharmacist, pager [phone] 06/30/2016,4:34 AM    ADDENDUM: Per Dr. Haroldine Laws, stop coumadin for now. Would eventually like to transition to eliquis once renal function and respiratory status stabilize. Continue heparin for now but decrease rate a little further to 1000 units/hr. Follow up daily heparin level and CBC.  Nena Jordan,  PharmD, BCPS 06/30/2016, 9:10 AM

## 2016-06-30 NOTE — Procedures (Signed)
Firsty treatment for AKI and volume overload.  She is already breathing better.  BP dropping some but tol so far. Christina Martin C

## 2016-06-30 NOTE — Progress Notes (Signed)
SLP Cancellation Note  Patient Details Name: Christina Martin MRN: [id number] DOB: [birth year]   Cancelled treatment:       Reason Eval/Treat Not Completed: Fatigue/lethargy limiting ability to participate;Patient at procedure or test/unavailable (off unit in AM, lethargic in PM). SLP attempted to complete BSE in the afternoon, however, pt was insufficiently arousable for assessment. Pt difficulty presents as more esophageal (rather than oropharyngeal) given symptoms and RN/family report. Will change diet to full liquid to be given ONLY when pt is fully awake and alert. SLP will continue efforts to evaluate swallow function and safety.  Celia B. Quentin Ore [facility], [place] [phone]  Shonna Chock 06/30/2016, 1:24 PM

## 2016-06-30 NOTE — Progress Notes (Signed)
Advanced Heart Failure Rounding Note  PCP:  Primary Cardiologist: Dr. Angelena Form   Subjective:    S/P TEE/DCCV 2/7 maintaining NSR. Remains on amio drip. Diuresing with high dose lasix. Sluggish urine output.   Mild dyspnea at rest.   More lethargic. Requires bipap last night. ABG 7.23/71/79/92%   Objective:   Weight Range: 257 lb 15 oz (117 kg) Body mass index is 47.18 kg/m.   Vital Signs:   Temp:  [96.2 F (35.7 C)-97.8 F (36.6 C)] 97.4 F (36.3 C) (02/09 0739) Pulse Rate:  [78-91] 80 (02/09 0739) Resp:  [13-22] 19 (02/09 0739) BP: (109-143)/(39-61) 117/46 (02/09 0739) SpO2:  [85 %-95 %] 92 % (02/09 0739) FiO2 (%):  [30 %] 30 % (02/08 1311) Weight:  [257 lb 15 oz (117 kg)] 257 lb 15 oz (117 kg) (02/09 0330) Last BM Date: 06/25/16  Weight change: Filed Weights   07/03/2016 0500 06/29/16 0323 06/30/16 0330  Weight: 249 lb 12.5 oz (113.3 kg) 250 lb 3.6 oz (113.5 kg) 257 lb 15 oz (117 kg)    Intake/Output:   Intake/Output Summary (Last 24 hours) at 06/30/16 0856 Last data filed at 06/30/16 0700  Gross per 24 hour  Intake           1363.6 ml  Output              725 ml  Net            638.6 ml     Physical Exam: General: Elderly female. Dyspneic at rest. In bed. Mildly letheargic HEENT: normal Neck: supple. JVP to jaw . Carotids 2+ bilat; No  bruits. No lymphadenopathy or thyromegaly appreciated. Cor: PMI nondisplaced. Regular rate & rhythm. No rubs, gallops or murmurs. Lungs: RML RLL EW.  Abdomen: obese soft, nontender, nondistended. No hepatosplenomegaly. No bruits or masses. Good bowel sounds. Extremities: no cyanosis, clubbing, rash, 2-3+ pretibial edema Neuro: Drowsy/ oriented x3, cranial nerves grossly intact. moves all 4 extremities w/o difficulty. Affect pleasant   Telemetry: NSR 70-80s.   Labs: CBC  Recent Labs  06/29/16 0240 06/30/16 0258  WBC 9.7 11.7*  HGB 11.8* 11.5*  HCT 36.9 36.6  MCV 91.1 91.7  PLT 210 123456   Basic Metabolic  Panel  Recent Labs  06/29/16 0825 06/30/16 0258  NA 135 133*  K 4.3 4.0  CL 94* 91*  CO2 28 29  GLUCOSE 135* 156*  BUN 60* 67*  CREATININE 2.45* 2.62*  CALCIUM 9.4 9.5       Medications:     Scheduled Medications: . feeding supplement (ENSURE ENLIVE)  237 mL Oral BID BM  . furosemide  160 mg Intravenous Q6H  . midodrine  5 mg Oral TID WC  . multivitamin with minerals  1 tablet Oral Daily  . pantoprazole  40 mg Oral Daily  . senna-docusate  1 tablet Oral BID  . sodium chloride flush  3 mL Intravenous Q12H  . sodium chloride irrigation  70 mL Irrigation See admin instructions  . warfarin  7.5 mg Oral ONCE-1800  . warfarin   Does not apply Once  . Warfarin - Pharmacist Dosing Inpatient   Does not apply q1800    Infusions: . amiodarone 30 mg/hr (06/30/16 0100)  . heparin 1,000 Units/hr (06/30/16 0800)    PRN Medications: acetaminophen, ALPRAZolam, hydrocortisone cream, lactulose, ondansetron **OR** ondansetron (ZOFRAN) IV, traMADol   Assessment/Plan   1. Acute on chronic diastolic CHF- ECHO EF 123456.  2. Hypertensive heart disease 3. New onset  atrial fibrillation     --CHADSVASC 7 4. Acute on chronic CKD stage IV 5. History of DVT on chronic anticoagulation 6. Morbid obesity 7. Hypercarbic respiratory failure  S/P TEE/DC-CV- maintaining NSR. Continue heparin. Could use low dose eliquis later.   Creatinine trending up. 2.45>2.6 .  Volume status up. Sluggish diuresis. Nephrology addressing diuretics. Yesterday there was some discussion about CVVHD. Would benefit from Palliative Care.     Length of Stay: [place], NP  06/30/2016, 8:56 AM  Advanced Heart Failure Team Pager [phone] (M-F; [place])  Please contact [facility] for night-coverage after hours (4p -7a ) and weekends on [email or web address]  Patient seen and examined with Christina Grinder, NP. We discussed all aspects of the encounter. I agree with the assessment and plan as stated above.   She  remains in NSR but respiratory status continues to worsen. Required Bipap overnight. Respiratory acidosis on ABG. Not responding to high-dose diuretics. I think only options may be short-term CVVHD or palliative care. Await Renal input. I am hesistant to start renal-dose dopamine given recent DC-CV and risk of recurrent AF. Hold warfarin. Continue heparin.   Christina Caskey,MD 12:56 PM

## 2016-06-30 NOTE — Progress Notes (Signed)
Subjective: Interval History: Still feeling very SOB but more awake/alert after BiPAP yesterday afternoon.   Objective: Vital signs in last 24 hours: Temp:  [96.2 F (35.7 C)-97.8 F (36.6 C)] 97.4 F (36.3 C) (02/09 0739) Pulse Rate:  [78-91] 80 (02/09 0739) Resp:  [13-22] 19 (02/09 0739) BP: (109-143)/(39-61) 117/46 (02/09 0739) SpO2:  [85 %-95 %] 92 % (02/09 0739) FiO2 (%):  [30 %] 30 % (02/08 1311) Weight:  [257 lb 15 oz (117 kg)] 257 lb 15 oz (117 kg) (02/09 0330) Weight change: 7 lb 11.5 oz (3.5 kg)  Intake/Output from previous day: 02/08 0701 - 02/09 0700 In: 1391.8 [P.O.:450; I.V.:673.8; IV Piggyback:198] Out: 725 [Urine:725] Intake/Output this shift: No intake/output data recorded.  Gen: lying in bed, very sleepy  Neck: JVD present  CV: RRR. No murmurs appreciated.  Lungs: Bibasilar crackles. Increased WOB. [place] in place.  GI: soft, NTND  Ext: 2-3+ bilateral LE edema with overlying tight skin, venous stasis changes present of skin    Lab Results:  Recent Labs  06/29/16 0240 06/30/16 0258  WBC 9.7 11.7*  HGB 11.8* 11.5*  HCT 36.9 36.6  PLT 210 215   BMET:   Recent Labs  06/29/16 0825 06/30/16 0258  NA 135 133*  K 4.3 4.0  CL 94* 91*  CO2 28 29  GLUCOSE 135* 156*  BUN 60* 67*  CREATININE 2.45* 2.62*  CALCIUM 9.4 9.5   No results for input(s): PTH in the last 72 hours. Iron Studies:  No results for input(s): IRON, TIBC, TRANSFERRIN, FERRITIN in the last 72 hours. Studies/Results: Dg Chest Port 1 View  Result Date: 06/29/2016 CLINICAL DATA:  Shortness of breath. EXAM: PORTABLE CHEST 1 VIEW COMPARISON:  06/27/2016 FINDINGS: There is hyperinflation of the lungs compatible with COPD. F cardiomegaly with vascular congestion and bilateral interstitial and alveolar opacities, likely mild edema. Suspect small effusions. No acute bony abnormality. IMPRESSION: Mild CHF.  Suspect small effusions. Electronically Signed   By: Rolm Baptise M.D.   On: 06/29/2016  13:22    I have reviewed the patient's current medications.  Assessment/Plan: Christina Martin is 83 y.o. female with PMH of chronic diastolic CHF, CKD4, neurogenic bladder with SPC in place,  And chronic DVT on Xarelto who was admitted with lower extremity swelling and weakness. Patient was found to be in Afib with RVR. She has received diuresis for an acute exacerbation of CHF. Found to have an AKI at admission that worsened, so nephrology became involved in her care.   1. AKI: Baseline Cr 1.6-1.8. Increased Lasix to 160 mg q6h yesterday and gave Metolazone 5 mg once. Patient with poor diuresis: 725cc UOP (+660 cc yesterday) and weight has increased 3.5 kg. Cr increased from 2.4 >> 2.6. Patient remains volume overloaded.  Suspect low perfusion given persistent hypotension complicated by use of AceI at home. Will discontinue Lasix after obtaining HD access. Plan for HD for volume removal. IR to place HD catheter today and will start treatment after access obtained.  2. Hypotension: With improvement in BP stability.  Requiring Midodrine. 3. Anemia: With low-normal Fe and saturation ratio of 13%. Would give Feraheme.  4. AFib with RVR: Rate controlled with amiodarone drip. IV heparin. S/p CV yesterday and remained in NSR overnight.  5. Acute CHF exacerbation: With volume excess. CXR obtained yesterday significant for effusions. Patient with respiratory acidosis requiring BiPAP yesterday afternoon. HD for volume removal.    LOS: 7 days   [name] 06/30/2016,8:56 AM  Renal Attending: She has failed medical therapy and will need dialysis.  See note above which I agree with plans for HD today. Karisma Meiser C  .

## 2016-06-30 NOTE — Progress Notes (Signed)
SLP Cancellation Note  Patient Details Name: Christina Martin MRN: [id number] DOB: 10-Jun-1924   Cancelled treatment:       Reason Eval/Treat Not Completed: Patient at procedure or test/unavailable. Unable to complete BSE at this time, as pt is currently off unit for testing. Will continue efforts. Pt is currently receiving regular consistency/thin liquid diet.  [name] B. Quentin [facility], CCC-SLP [id number]  Shonna Chock 06/30/2016, 11:49 AM

## 2016-06-30 NOTE — Progress Notes (Signed)
Transported to hemodialysis by bed, stable,report given to RN.

## 2016-06-30 NOTE — Progress Notes (Signed)
Pt promotes feeling poorly. Along with condition notes in Progress Note from this RN at 2051, pt is now pursed lipped breathing at a rate of 20 with sats stable at 94% on 4 liters. When asked if she feels short of breath, pt states, "I have felt short of breath today." Pt continues to promotes discomfort from constipation as well however d/t respiratory condition and condition from cardiological stance, this RN doesn't think it is in her best interested to get out of bed at this time and pt refuses to attempt bedpan usage. Charge RN called to beside to evaluate pt. NP paged about pt condition and orders given. Will attempt to help pt feel more comfortable as her vitals are stable at this time.

## 2016-07-01 LAB — HEPARIN LEVEL (UNFRACTIONATED)
Heparin Unfractionated: >2.2 IU/mL — ABNORMAL HIGH (ref 0.30–0.70)
Heparin Unfractionated: 0.43 IU/mL (ref 0.30–0.70)

## 2016-07-01 LAB — RENAL FUNCTION PANEL
ANION GAP: 11 (ref 5–15)
Albumin: 2.9 g/dL — ABNORMAL LOW (ref 3.5–5.0)
BUN: 36 mg/dL — ABNORMAL HIGH (ref 6–20)
CALCIUM: 9.4 mg/dL (ref 8.9–10.3)
CO2: 29 mmol/L (ref 22–32)
CREATININE: 2.07 mg/dL — AB (ref 0.44–1.00)
Chloride: 91 mmol/L — ABNORMAL LOW (ref 101–111)
GFR, EST AFRICAN AMERICAN: 23 mL/min — AB (ref >60)
GFR, EST NON AFRICAN AMERICAN: 20 mL/min — AB (ref >60)
Glucose, Bld: 136 mg/dL — ABNORMAL HIGH (ref 65–99)
PHOSPHORUS: 4 mg/dL (ref 2.5–4.6)
Potassium: 3.9 mmol/L (ref 3.5–5.1)
SODIUM: 131 mmol/L — AB (ref 135–145)

## 2016-07-01 LAB — BASIC METABOLIC PANEL
ANION GAP: 14 (ref 5–15)
BUN: 39 mg/dL — AB (ref 6–20)
CHLORIDE: 91 mmol/L — AB (ref 101–111)
CO2: 26 mmol/L (ref 22–32)
Calcium: 9 mg/dL (ref 8.9–10.3)
Creatinine, Ser: 2.04 mg/dL — ABNORMAL HIGH (ref 0.44–1.00)
GFR calc Af Amer: 24 mL/min — ABNORMAL LOW (ref >60)
GFR, EST NON AFRICAN AMERICAN: 20 mL/min — AB (ref >60)
GLUCOSE: 115 mg/dL — AB (ref 65–99)
POTASSIUM: 3.7 mmol/L (ref 3.5–5.1)
Sodium: 131 mmol/L — ABNORMAL LOW (ref 135–145)

## 2016-07-01 LAB — CBC
HEMATOCRIT: 35.1 % — AB (ref 36.0–46.0)
HEMOGLOBIN: 11.1 g/dL — AB (ref 12.0–15.0)
MCH: 28.5 pg (ref 26.0–34.0)
MCHC: 31.6 g/dL (ref 30.0–36.0)
MCV: 90 fL (ref 78.0–100.0)
PLATELETS: 197 10*3/uL (ref 150–400)
RBC: 3.9 MIL/uL (ref 3.87–5.11)
RDW: 15.6 % — ABNORMAL HIGH (ref 11.5–15.5)
WBC: 9.2 10*3/uL (ref 4.0–10.5)

## 2016-07-01 LAB — HEPATITIS B SURFACE ANTIGEN: Hepatitis B Surface Ag: NEGATIVE

## 2016-07-01 MED ORDER — SODIUM CHLORIDE 0.9 % FOR CRRT
INTRAVENOUS_CENTRAL | Status: DC | PRN
  Administered 2016-07-03: 20:00:00 via INTRAVENOUS_CENTRAL
  Filled 2016-07-01 (×2): qty 1000

## 2016-07-01 MED ORDER — PRISMASOL BGK 4/2.5 32-4-2.5 MEQ/L IV SOLN
INTRAVENOUS | Status: DC
  Administered 2016-07-01 – 2016-07-02 (×3): via INTRAVENOUS_CENTRAL
  Filled 2016-07-01 (×7): qty 5000

## 2016-07-01 MED ORDER — CALCIUM GLUCONATE 10 % IV SOLN
20.0000 g | INTRAVENOUS | Status: DC
  Filled 2016-07-01: qty 200

## 2016-07-01 MED ORDER — PRISMASOL BGK 4/2.5 32-4-2.5 MEQ/L IV SOLN
INTRAVENOUS | Status: DC
  Administered 2016-07-01 – 2016-07-03 (×17): via INTRAVENOUS_CENTRAL
  Filled 2016-07-01 (×24): qty 5000

## 2016-07-01 MED ORDER — SODIUM CHLORIDE 0.9 % IV SOLN
0.0000 ug/min | INTRAVENOUS | Status: DC
  Filled 2016-07-01: qty 1

## 2016-07-01 MED ORDER — BISACODYL 10 MG RE SUPP
10.0000 mg | Freq: Once | RECTAL | Status: AC
  Administered 2016-07-01: 10 mg via RECTAL
  Filled 2016-07-01: qty 1

## 2016-07-01 MED ORDER — MIDODRINE HCL 5 MG PO TABS: 5.0000 mg | ORAL_TABLET | Freq: Three times a day (TID) | ORAL | Status: DC

## 2016-07-01 MED ORDER — HEPARIN SODIUM (PORCINE) 1000 UNIT/ML DIALYSIS
1000.0000 [IU] | INTRAMUSCULAR | Status: DC | PRN
  Filled 2016-07-01: qty 6

## 2016-07-01 MED ORDER — PRISMASOL BGK 4/2.5 32-4-2.5 MEQ/L IV SOLN
INTRAVENOUS | Status: DC
  Administered 2016-07-01 – 2016-07-03 (×4): via INTRAVENOUS_CENTRAL
  Filled 2016-07-01 (×7): qty 5000

## 2016-07-01 MED ORDER — LACTULOSE 10 GM/15ML PO SOLN
20.0000 g | Freq: Two times a day (BID) | ORAL | Status: DC
  Administered 2016-07-01 – 2016-07-03 (×5): 20 g via ORAL
  Filled 2016-07-01 (×5): qty 30

## 2016-07-01 NOTE — Progress Notes (Signed)
ANTICOAGULATION CONSULT NOTE - Follow Up Consult  Pharmacy Consult for Heparin and Coumadin Indication: atrial fibrillation  Allergies  Allergen Reactions  . Lithostat [Acetohydroxamic Acid] Other (See Comments)    Unusual tiredness, weakness, nervousness, mental confusion, sore throat, Poor appetite    Patient Measurements: Height: 5\' 2"  (157.5 cm) Weight: 260 lb 5.8 oz (118.1 kg) IBW/kg (Calculated) : 50.1 Heparin Dosing Weight: 77kg  Vital Signs: Temp: 98 F (36.7 C) (02/10 0730) Temp Source: Oral (02/10 0730) BP: 118/60 (02/10 0730) Pulse Rate: 133 (02/10 0730)  Labs:  Recent Labs  06/29/16 0240 06/29/16 0825 06/30/16 0258 07/01/16 0337 07/01/16 0645  HGB 11.8*  --  11.5* 11.1*  --   HCT 36.9  --  36.6 35.1*  --   PLT 210  --  215 197  --   LABPROT 14.0  --  14.1  --   --   INR 1.07  --  1.08  --   --   HEPARINUNFRC 0.54  --  0.71* >2.20* 0.43  CREATININE  --  2.45* 2.62* 2.04*  --     Estimated Creatinine Clearance: 22.8 mL/min (by C-G formula based on SCr of 2.04 mg/dL (H)).   Medications:  Heparin @ 1000 units/hr  Assessment: 89yof on xarelto pta for hx DVT, transitioned to heparin in setting of renal failure and new onset afib. Coumadin started 2/7 but stopped 2/9 for HD cath placement. Heparin level at goal 0.4(>2 this am drawn incorrectly).  CBC stable, no bleeding noted. S/p DCCV > SR back in Afib RVR 130s  2/9 after HD - remains on amiodarone drip.  Goal of Therapy:  Heparin level 0.3-0.7 INR 2-3 Monitor platelets by anticoagulation protocol: Yes   Plan:  Continue heparin to 1000 units/hr Daily heparin level, CBC  Bonnita Nasuti Pharm.D. CPP, BCPS Clinical Pharmacist [phone] 07/01/2016 8:36 AM

## 2016-07-01 NOTE — Progress Notes (Signed)
Patient ID: Christina Martin, female   DOB: [birth year], 83 y.o.   MRN: [id number]  PROGRESS NOTE    Christina Martin  [id number] DOB: Jun 21, 1924 DOA: 07/19/2016  PCP: [name], MD   Brief Narrative:  83 y.o.femalewith medical history significant for chronic diastolic CHF (last 2 D ECHO in 03/2015 showed EF 65% and grade 1 DD), CKD stage 4 with baseline creatinine 1.80 back in 03/2015, hypertension, GERD. She presented to [facility] with worsening lower extremity swelling and difficulty ambulating over past 2 weeks prior to this admission. She also had shortness of breath with exertion. She took extra dose of lasix for past 1 week without significant symptomatic relief.  Pt was hemodynamically stable. Her blood work showed potassium of 5.2, Cr 2.15, troponin 0.04. The 12 lead EKG showed atrial fibrillation. She was started on IV lasix 60 mg IV Q 12 hours. Cardio seen in consultation. Due to renal insufficiency nephrology has been consulted as well.    Assessment & Plan:   Acute on chronic diastolic heart failure  And AKI - ECHO with a EF of 60-65%, no wall motion abnormalities, moderate left atrial enlargement and severe RAE, moderate TR IVC dilated. -on milrinone and high dose IV lasix -160mg  Q8, urine output sluggish, Renal following, s/p HD catheter and HD/UF x1 yesterday, didn't tolerate this well -she would be a very poor long term HD candidate, palliative following and needs further discussion before long term HD plans made, I discussed this with son and daughter yesterday and today -now plan for CRRT, hypotension limiting HD yesterday  -Acute renal failure superimposed on stage 4 chronic kidney disease ([place]) - Cr baseline is 1.6 on 02/2016 and on this admission 2.15 which has worsened to 2.68  - Renal ultrasound with mild right renal atrophy. No hydronephrosis or renal obstruction noted. - see discussion above regarding ultrafiltration/HD -not a good long term HD  candidate -Palliative following, Christina Martin to re-eval on Monday    New onset atrial fibrillation - CHADS vasc score 5, continue amiodarone drip - Initially on xarelto but this was placed on hold due to renal insufficiency so she is on heparin drip - s/p cardioversion, converted to NSR, now back in Afib    Hx of Left leg DVT 08-2014 - Decreased the dose to 10 mg a day by hematology and she was supposed to be on it through 08/2016.  - Xarelto on hold due to renal insufficiency  - On heparin drip  - Lower extremity Doppler negative for DVT  Essential hypertension - Lisinopril on hold due to renal insufficiency  - On IV lasix, midodrine   Hyperkalemia - improved  Elevated troponin - Likely demand ischemia due to CKD, acute CHF - Troponin 0.04, 0.05 and 0.04 - No plans for ischemic workup at this time  GERD without esophagitis - Continue protonix   Anemia of chronic kidney disease - stable, monitor   Constipation -increase lactulose, add dulcolax  DVT prophylaxis: On heparin drip  Code Status: DNR/DNI Family Communication: Family at the bedside this am Disposition Plan: SNF when stable   Consultants:   Cardiology: Dr Debara Pickett 06/24/2016  Renal  Procedures:   Renal ultrasound 06/25/2016  Lower extremity dopplers 06/24/2016  2-D echo 06/24/2016 - EF 60%  Antimicrobials:   None   Subjective: Restless uncomfortable, short of breath and constipated  Objective: Vitals:   07/01/16 0027 07/01/16 0400 07/01/16 0700 07/01/16 0730  BP: 102/60 112/69  118/60  Pulse: (!) 121 (!)  127 (!) 131 (!) 133  Resp:  (!) 21 19 17   Temp: 98 F (36.7 C) 98.2 F (36.8 C)  98 F (36.7 C)  TempSrc: Oral Oral  Oral  SpO2: 96% 90% 92% 92%  Weight:      Height:        Intake/Output Summary (Last 24 hours) at 07/01/16 1105 Last data filed at 07/01/16 0800  Gross per 24 hour  Intake            640.6 ml  Output              450 ml  Net            190.6 ml    Filed Weights   06/29/16 0323 06/30/16 0330 06/30/16 1558  Weight: 113.5 kg (250 lb 3.6 oz) 117 kg (257 lb 15 oz) 118.1 kg (260 lb 5.8 oz)    Examination:  General exam: AAOx3, no distress Respiratory system: few basilar crackles Cardiovascular system: S1 & S2 heard, slight tachycardia  Gastrointestinal system: A(+) BS, non tender abd  Central nervous system: No focal neurological deficits. Extremities: +2 LE pitting edema, redness of bilateral extremities with chronic venous skin changes  Skin: Skin is warm and dry   Psychiatry: Normal mood and behavior   Data Reviewed: I have personally reviewed following labs and imaging studies  CBC:  Recent Labs Lab 06/25/16 1412  06/27/16 0317 07/12/2016 0349 06/29/16 0240 06/30/16 0258 07/01/16 0337  WBC  --   < > 7.6 9.0 9.7 11.7* 9.2  NEUTROABS 4.3  --   --   --   --   --   --   HGB  --   < > 11.1* 11.2* 11.8* 11.5* 11.1*  HCT  --   < > 35.1* 34.9* 36.9 36.6 35.1*  MCV  --   < > 90.9 90.4 91.1 91.7 90.0  PLT  --   < > 209 194 210 215 197  < > = values in this interval not displayed. Basic Metabolic Panel:  Recent Labs Lab 06/26/16 0320 06/27/16 0317 07/01/2016 0349 06/29/16 0825 06/30/16 0258 07/01/16 0337  NA 136 136 136 135 133* 131*  K 4.4 4.5 4.3 4.3 4.0 3.7  CL 99* 95* 96* 94* 91* 91*  CO2 29 29 28 28 29 26   GLUCOSE 102* 112* 122* 135* 156* 115*  BUN 51* 58* 57* 60* 67* 39*  CREATININE 2.65* 2.68* 2.41* 2.45* 2.62* 2.04*  CALCIUM 8.9 9.3 9.3 9.4 9.5 9.0  MG 2.1  --   --   --   --   --   PHOS 4.6  --   --   --   --   --    GFR: Estimated Creatinine Clearance: 22.8 mL/min (by C-G formula based on SCr of 2.04 mg/dL (H)). Liver Function Tests:  Recent Labs Lab 06/26/16 0320  AST 32  ALT 26  ALKPHOS 86  BILITOT 0.5  PROT 5.9*  ALBUMIN 2.9*   No results for input(s): LIPASE, AMYLASE in the last 168 hours. No results for input(s): AMMONIA in the last 168 hours. Coagulation Profile:  Recent Labs Lab  06/29/16 0240 06/30/16 0258  INR 1.07 1.08   Cardiac Enzymes: No results for input(s): CKTOTAL, CKMB, CKMBINDEX, TROPONINI in the last 168 hours. BNP (last 3 results) No results for input(s): PROBNP in the last 8760 hours. HbA1C: No results for input(s): HGBA1C in the last 72 hours. CBG:  Recent Labs Lab 06/25/16 2158 06/26/16  0825  GLUCAP 114* 99   Lipid Profile: No results for input(s): CHOL, HDL, LDLCALC, TRIG, CHOLHDL, LDLDIRECT in the last 72 hours. Thyroid Function Tests: No results for input(s): TSH, T4TOTAL, FREET4, T3FREE, THYROIDAB in the last 72 hours. Anemia Panel: No results for input(s): VITAMINB12, FOLATE, FERRITIN, TIBC, IRON, RETICCTPCT in the last 72 hours. Urine analysis:    Component Value Date/Time   COLORURINE YELLOW 07/17/2016 1834   APPEARANCEUR CLOUDY (A) 07/06/2016 1834   LABSPEC 1.013 06/24/2016 1834   PHURINE 5.0 06/29/2016 1834   GLUCOSEU NEGATIVE 06/24/2016 1834   HGBUR SMALL (A) 06/25/2016 1834   BILIRUBINUR NEGATIVE 07/05/2016 1834   KETONESUR NEGATIVE 06/22/2016 1834   PROTEINUR NEGATIVE 07/07/2016 1834   NITRITE NEGATIVE 07/07/2016 1834   LEUKOCYTESUR LARGE (A) 07/19/2016 1834   Sepsis Labs: @LABRCNTIP (procalcitonin:4,lacticidven:4)    Urine culture     Status: Abnormal   Collection Time: 06/25/16  8:16 AM  Result Value Ref Range Status   Specimen Description URINE, SUPRAPUBIC  Final   Special Requests NONE  Final   Culture MULTIPLE SPECIES PRESENT, SUGGEST RECOLLECTION (A)  Final   Report Status 06/26/2016 FINAL  Final  MRSA PCR Screening     Status: None   Collection Time: 06/25/16  5:07 PM  Result Value Ref Range Status   MRSA by PCR NEGATIVE NEGATIVE Final      Radiology Studies: US Renal Result Date: 06/25/2016 Mild right renal atrophy. No hydronephrosis or renal obstruction is noted. Electronically Signed   By: Marijo Conception, M.D.   On: 06/25/2016 08:57     Scheduled Meds: . docusate sodium  100 mg Oral Daily   . feeding supplement   237 mL Oral BID BM  . furosemide  100 mg Intravenous Q8H  . midodrine  5 mg Oral TID WC  . multivitamin with min  1 tablet Oral Daily  . pantoprazole  40 mg Oral Daily   Continuous Infusions: . amiodarone 30 mg/hr (07/01/16 0743)  . heparin 1,000 Units/hr (07/01/16 0040)  . dialysis replacement fluid (prismasate)    . dialysis replacement fluid (prismasate)    . dialysate (PRISMASATE)       LOS: 8 days    Time spent: 35 minutes  Greater than 50% of the time spent on counseling and coordinating the care.   Domenic Polite, MD [facility] Pager [phone]  If 7PM-7AM, please contact night-coverage [email or web address] Password [email or web address] 07/01/2016, 11:05 AM

## 2016-07-01 NOTE — Progress Notes (Signed)
Advanced Heart Failure Rounding Note  PCP:  Primary Cardiologist: Dr. Angelena Form   Subjective:    S/P TEE/DCCV 2/7  Trialysis catheter placed 2/9. Tolerated CVVHD but has dome hypotension. Overall I/Os remain positive. Weight continues to climb.   Respiratory status remains tenuous. She is SOB and lethergic.  Reverted back to AF. Rate back in 130s despite amio gtt.    Objective:   Weight Range: 120.4 kg (265 lb 6.9 oz) Body mass index is 48.55 kg/m.   Vital Signs:   Temp:  [97.8 F (36.6 C)-98.2 F (36.8 C)] 98 F (36.7 C) (02/10 1201) Pulse Rate:  [100-135] 135 (02/10 1201) Resp:  [17-29] 21 (02/10 1201) BP: (85-124)/(30-69) 124/54 (02/10 1201) SpO2:  [90 %-96 %] 95 % (02/10 1201) FiO2 (%):  [40 %] 40 % (02/10 0730) Weight:  [118.1 kg (260 lb 5.8 oz)-120.4 kg (265 lb 6.9 oz)] 120.4 kg (265 lb 6.9 oz) (02/10 1145) Last BM Date: 06/25/16  Weight change: Filed Weights   06/30/16 0330 06/30/16 1558 07/01/16 1145  Weight: 117 kg (257 lb 15 oz) 118.1 kg (260 lb 5.8 oz) 120.4 kg (265 lb 6.9 oz)    Intake/Output:   Intake/Output Summary (Last 24 hours) at 07/01/16 1229 Last data filed at 07/01/16 0800  Gross per 24 hour  Intake            630.6 ml  Output              450 ml  Net            180.6 ml     Physical Exam: General: Elderly female. Dyspneic at rest. In bed. Lethargic. On Venni mask HEENT: normal Neck: supple. JVP to jaw . R trialusis cath Carotids 2+ bilat; No  bruits. No lymphadenopathy or thyromegaly appreciated. Cor: PMI nondisplaced. Irreg. Tachy  Lungs: + crackles  Abdomen: obese soft, nontender, nondistended. No hepatosplenomegaly. No bruits or masses. Good bowel sounds. Extremities: no cyanosis, clubbing, rash,3+ pretibial edema R arm very edematous Neuro: Drowsy/ oriented x3, cranial nerves grossly intact. moves all 4 extremities w/o difficulty. Affect pleasant   Telemetry: AF 100-130s  Labs: CBC  Recent Labs  06/30/16 0258  07/01/16 0337  WBC 11.7* 9.2  HGB 11.5* 11.1*  HCT 36.6 35.1*  MCV 91.7 90.0  PLT 215 [id number]   Basic Metabolic Panel  Recent Labs  06/30/16 0258 07/01/16 0337  NA 133* 131*  K 4.0 3.7  CL 91* 91*  CO2 29 26  GLUCOSE 156* 115*  BUN 67* 39*  CREATININE 2.62* 2.04*  CALCIUM 9.5 9.0       Medications:     Scheduled Medications: . feeding supplement (NEPRO CARB STEADY)  237 mL Oral TID BM  . lactulose  20 g Oral BID  . midodrine  5 mg Oral TID WC  . pantoprazole  40 mg Oral Daily  . senna-docusate  1 tablet Oral BID  . sodium chloride flush  3 mL Intravenous Q12H  . sodium chloride irrigation  70 mL Irrigation See admin instructions    Infusions: . amiodarone 30 mg/hr (07/01/16 0743)  . heparin 1,000 Units/hr (07/01/16 0040)  . dialysis replacement fluid (prismasate) 300 mL/hr at 07/01/16 1207  . dialysis replacement fluid (prismasate) 300 mL/hr at 07/01/16 1210  . dialysate (PRISMASATE) 1,500 mL/hr at 07/01/16 1210    PRN Medications: acetaminophen, ALPRAZolam, heparin, hydrocortisone cream, lidocaine (PF), morphine injection, ondansetron **OR** ondansetron (ZOFRAN) IV, sodium chloride, traMADol   Assessment/Plan  1. Acute on chronic diastolic CHF- ECHO EF [id number].  2. Hypertensive heart disease 3. New onset atrial fibrillation     --CHADSVASC --s/p TEE/DCCV on 2/8 --reverted to AF 2/10 4. Acute on chronic CKD stage IV --CVVHD initiated 2/9 5. History of DVT on chronic anticoagulation 6. Morbid obesity 7. Hypercarbic/hypoxic respiratory failure  Respiratory status remains quite tenuous despite initaition of CVVHD. Suspect she needs more volume off but limited by hypotension. Will add midodrine to support. Can also use Neo as needed.   She is back in AF with RVR will continue IV amio in an effort to rate control. Continue heparin. Can retry DC-CV after further amio load but given LA size unlikely to hold for long. Can consider AVN ablation and CRT-P as last  option. Not using b-blocker od CCB due to hypotension. Continue heparin.   Overall, I am growing increasingly concerned about her prognosis given her age and increasing comorbidities. I discussed [place] with her family and if we continue to lose ground over the next 24-48 hours will consider switch to comfort care.   The patient is critically ill with multiple organ systems failure and requires high complexity decision making for assessment and support, frequent evaluation and titration of therapies, application of advanced monitoring technologies and extensive interpretation of multiple databases.   Critical Care Time devoted to patient care services described in this note is 35 Minutes.     Length of Stay: 8   Glori Bickers, MD  07/01/2016, 12:29 PM  Advanced Heart Failure Team Pager [phone] (M-F; 7a - 4p)  Please contact [facility] for night-coverage after hours (4p -7a ) and weekends on [email or web address]  Patient seen and examined with Darrick Grinder, NP. We discussed all aspects of the encounter. I agree with the assessment and plan as stated above.   She remains in NSR but respiratory status continues to worsen. Required Bipap overnight. Respiratory acidosis on ABG. Not responding to high-dose diuretics. I think only options may be short-term CVVHD or palliative care. Await Renal input. I am hesistant to start renal-dose dopamine given recent DC-CV and risk of recurrent AF. Hold warfarin. Continue heparin.   Bensimhon, Daniel,MD 12:29 PM

## 2016-07-01 NOTE — Progress Notes (Signed)
She felt better initially during dialysis with respect to breathing, but has had continuous problems with constipation.  She had some hypotension during later portion of hemodialysis.  Apparently she converted to atrial fib post dialysis.  I will order CRRT. Tayla Panozzo C

## 2016-07-01 NOTE — Progress Notes (Signed)
Subjective: Interval History: Still feeling SOB but more awake/alert. On Venturi mask currently. Had HD yesterday with some hypotension due to the later portion of the session.   Objective: Vital signs in last 24 hours: Temp:  [97.8 F (36.6 C)-98.2 F (36.8 C)] 97.8 F (36.6 C) (02/10 1118) Pulse Rate:  [100-133] 113 (02/10 1118) Resp:  [17-29] 21 (02/10 1118) BP: (85-123)/(30-69) 121/61 (02/10 1118) SpO2:  [90 %-96 %] 92 % (02/10 1118) FiO2 (%):  [40 %] 40 % (02/10 0730) Weight:  [118.1 kg (260 lb 5.8 oz)] 118.1 kg (260 lb 5.8 oz) (02/09 1558) Weight change: 1.1 kg (2 lb 6.8 oz)  Intake/Output from previous day: 02/09 0701 - 02/10 0700 In: 868.2 [P.O.:459; I.V.:343.2; IV Piggyback:66] Out: 450 [Urine:350; Emesis/NG output:100] Intake/Output this shift: Total I/O In: 29.4 [I.V.:29.4] Out: -   Gen: lying in bed, more alert today   Neck: JVD present  CV: RRR. No murmurs appreciated.  Lungs: Bibasilar crackles. Increased WOB. Venturi mask  GI: soft, NTND  Ext: 2-3+ bilateral LE edema with overlying tight skin, venous stasis changes present of skin    Lab Results:  Recent Labs  06/30/16 0258 07/01/16 0337  WBC 11.7* 9.2  HGB 11.5* 11.1*  HCT 36.6 35.1*  PLT 215 197   BMET:   Recent Labs  06/30/16 0258 07/01/16 0337  NA 133* 131*  K 4.0 3.7  CL 91* 91*  CO2 29 26  GLUCOSE 156* 115*  BUN 67* 39*  CREATININE 2.62* 2.04*  CALCIUM 9.5 9.0   No results for input(s): PTH in the last 72 hours. Iron Studies:  No results for input(s): IRON, TIBC, TRANSFERRIN, FERRITIN in the last 72 hours. Studies/Results: Ir Fluoro Guide Cv Line Right  Result Date: 06/30/2016 INDICATION: End-stage renal disease. In need of intravenous access for the initiation of dialysis. EXAM: NON-TUNNELED CENTRAL VENOUS HEMODIALYSIS CATHETER PLACEMENT WITH ULTRASOUND AND FLUOROSCOPIC GUIDANCE COMPARISON:  None. MEDICATIONS: None FLUOROSCOPY TIME:  1 minutes, 24 seconds (18 mGy) COMPLICATIONS:  None immediate. PROCEDURE: Informed written consent was obtained from the patient after a discussion of the risks, benefits, and alternatives to treatment. Questions regarding the procedure were encouraged and answered. The right neck and chest were prepped with chlorhexidine in a sterile fashion, and a sterile drape was applied covering the operative field. Maximum barrier sterile technique with sterile gowns and gloves were used for the procedure. A timeout was performed prior to the initiation of the procedure. After creating a small venotomy incision, a micropuncture kit was utilized to access the right internal jugular vein under direct, real-time ultrasound guidance after the overlying soft tissues were anesthetized with 1% lidocaine with epinephrine. Ultrasound image documentation was performed. The microwire was kinked to measure appropriate catheter length. A stiff glidewire was advanced to the level of the IVC. Under fluoroscopic guidance, the venotomy was serially dilated, ultimately allowing placement of a 20 cm temporary Trialysis catheter with tip ultimately terminating within the superior aspect of the right atrium. Final catheter positioning was confirmed and documented with a spot radiographic image. The catheter aspirates and flushes normally. The catheter was flushed with appropriate volume heparin dwells. The catheter exit site was secured with a 0-Prolene retention suture. A dressing was placed. The patient tolerated the procedure well without immediate post procedural complication. IMPRESSION: Successful placement of a right internal jugular approach 20 cm temporary dialysis catheter with tip terminating with in the superior aspect of the right atrium. The catheter is ready for immediate use. PLAN: This  catheter may be converted to a tunneled dialysis catheter at a later date as indicated. Electronically Signed   By: Sandi Mariscal M.D.   On: 06/30/2016 13:22   Ir US Guide Vasc Access  Right  Result Date: 06/30/2016 INDICATION: End-stage renal disease. In need of intravenous access for the initiation of dialysis. EXAM: NON-TUNNELED CENTRAL VENOUS HEMODIALYSIS CATHETER PLACEMENT WITH ULTRASOUND AND FLUOROSCOPIC GUIDANCE COMPARISON:  None. MEDICATIONS: None FLUOROSCOPY TIME:  1 minutes, 24 seconds (18 mGy) COMPLICATIONS: None immediate. PROCEDURE: Informed written consent was obtained from the patient after a discussion of the risks, benefits, and alternatives to treatment. Questions regarding the procedure were encouraged and answered. The right neck and chest were prepped with chlorhexidine in a sterile fashion, and a sterile drape was applied covering the operative field. Maximum barrier sterile technique with sterile gowns and gloves were used for the procedure. A timeout was performed prior to the initiation of the procedure. After creating a small venotomy incision, a micropuncture kit was utilized to access the right internal jugular vein under direct, real-time ultrasound guidance after the overlying soft tissues were anesthetized with 1% lidocaine with epinephrine. Ultrasound image documentation was performed. The microwire was kinked to measure appropriate catheter length. A stiff glidewire was advanced to the level of the IVC. Under fluoroscopic guidance, the venotomy was serially dilated, ultimately allowing placement of a 20 cm temporary Trialysis catheter with tip ultimately terminating within the superior aspect of the right atrium. Final catheter positioning was confirmed and documented with a spot radiographic image. The catheter aspirates and flushes normally. The catheter was flushed with appropriate volume heparin dwells. The catheter exit site was secured with a 0-Prolene retention suture. A dressing was placed. The patient tolerated the procedure well without immediate post procedural complication. IMPRESSION: Successful placement of a right internal jugular approach 20 cm  temporary dialysis catheter with tip terminating with in the superior aspect of the right atrium. The catheter is ready for immediate use. PLAN: This catheter may be converted to a tunneled dialysis catheter at a later date as indicated. Electronically Signed   By: Sandi Mariscal M.D.   On: 06/30/2016 13:22   Dg Chest Port 1 View  Result Date: 06/29/2016 CLINICAL DATA:  Shortness of breath. EXAM: PORTABLE CHEST 1 VIEW COMPARISON:  06/27/2016 FINDINGS: There is hyperinflation of the lungs compatible with COPD. F cardiomegaly with vascular congestion and bilateral interstitial and alveolar opacities, likely mild edema. Suspect small effusions. No acute bony abnormality. IMPRESSION: Mild CHF.  Suspect small effusions. Electronically Signed   By: Rolm Baptise M.D.   On: 06/29/2016 13:22    I have reviewed the patient's current medications.  Assessment/Plan: Christina Martin is 83 y.o. female with PMH of chronic diastolic CHF, CKD4, neurogenic bladder with SPC in place,  And chronic DVT on Xarelto who was admitted with lower extremity swelling and weakness. Patient was found to be in Afib with RVR. She has received diuresis for an acute exacerbation of CHF. Found to have an AKI at admission that worsened, so nephrology became involved in her care.   AKI: Suspect low perfusion given persistent hypotension complicated by use of AceI at home. Baseline Cr 1.6-1.8. Patient remains volume overloaded, creatinine slightly improved. Hypotension during the later portion of HD. Will start CRRT.   Hypotension: With improvement in BP stability.  Requiring Midodrine. Anemia: With low-normal Fe and saturation ratio of 13%. Would give Feraheme.  AFib with RVR: Rate controlled with amiodarone drip. IV heparin. S/p CV  yesterday and remained in NSR overnight.  Acute CHF exacerbation: With volume excess. CXR obtained yesterday significant for effusions. Patient with respiratory acidosis requiring BiPAP yesterday afternoon. HD for  volume removal.    LOS: 8 days   Smiley Houseman 07/01/2016,11:26 AM   Renal Attending: As above. I agree with note as written. Volume overloaded and have inititated CRRT due to labile BP. Halina Asano C    .

## 2016-07-02 DIAGNOSIS — L8990 Pressure ulcer of unspecified site, unspecified stage: Secondary | ICD-10-CM | POA: Insufficient documentation

## 2016-07-02 LAB — BASIC METABOLIC PANEL
ANION GAP: 9 (ref 5–15)
BUN: 27 mg/dL — ABNORMAL HIGH (ref 6–20)
CHLORIDE: 95 mmol/L — AB (ref 101–111)
CO2: 30 mmol/L (ref 22–32)
Calcium: 9.2 mg/dL (ref 8.9–10.3)
Creatinine, Ser: 1.76 mg/dL — ABNORMAL HIGH (ref 0.44–1.00)
GFR, EST AFRICAN AMERICAN: 28 mL/min — AB (ref >60)
GFR, EST NON AFRICAN AMERICAN: 24 mL/min — AB (ref >60)
Glucose, Bld: 120 mg/dL — ABNORMAL HIGH (ref 65–99)
POTASSIUM: 4 mmol/L (ref 3.5–5.1)
SODIUM: 134 mmol/L — AB (ref 135–145)

## 2016-07-02 LAB — CBC
HCT: 37.8 % (ref 36.0–46.0)
HEMOGLOBIN: 12 g/dL (ref 12.0–15.0)
MCH: 29.1 pg (ref 26.0–34.0)
MCHC: 31.7 g/dL (ref 30.0–36.0)
MCV: 91.7 fL (ref 78.0–100.0)
PLATELETS: 188 10*3/uL (ref 150–400)
RBC: 4.12 MIL/uL (ref 3.87–5.11)
RDW: 16 % — ABNORMAL HIGH (ref 11.5–15.5)
WBC: 8 10*3/uL (ref 4.0–10.5)

## 2016-07-02 LAB — RENAL FUNCTION PANEL
ALBUMIN: 2.7 g/dL — AB (ref 3.5–5.0)
ALBUMIN: 2.8 g/dL — AB (ref 3.5–5.0)
ANION GAP: 9 (ref 5–15)
ANION GAP: 9 (ref 5–15)
BUN: 27 mg/dL — ABNORMAL HIGH (ref 6–20)
BUN: 21 mg/dL — AB (ref 6–20)
CALCIUM: 9.2 mg/dL (ref 8.9–10.3)
CALCIUM: 9.3 mg/dL (ref 8.9–10.3)
CO2: 30 mmol/L (ref 22–32)
CO2: 28 mmol/L (ref 22–32)
CREATININE: 1.75 mg/dL — AB (ref 0.44–1.00)
Chloride: 94 mmol/L — ABNORMAL LOW (ref 101–111)
Chloride: 97 mmol/L — ABNORMAL LOW (ref 101–111)
Creatinine, Ser: 1.64 mg/dL — ABNORMAL HIGH (ref 0.44–1.00)
GFR calc Af Amer: 31 mL/min — ABNORMAL LOW (ref >60)
GFR calc non Af Amer: 25 mL/min — ABNORMAL LOW (ref >60)
GFR calc non Af Amer: 27 mL/min — ABNORMAL LOW (ref >60)
GFR, EST AFRICAN AMERICAN: 29 mL/min — AB (ref >60)
GLUCOSE: 134 mg/dL — AB (ref 65–99)
Glucose, Bld: 118 mg/dL — ABNORMAL HIGH (ref 65–99)
PHOSPHORUS: 3.6 mg/dL (ref 2.5–4.6)
PHOSPHORUS: 3.5 mg/dL (ref 2.5–4.6)
POTASSIUM: 4.3 mmol/L (ref 3.5–5.1)
Potassium: 4 mmol/L (ref 3.5–5.1)
SODIUM: 133 mmol/L — AB (ref 135–145)
SODIUM: 134 mmol/L — AB (ref 135–145)

## 2016-07-02 LAB — HEPARIN LEVEL (UNFRACTIONATED): Heparin Unfractionated: 0.53 IU/mL (ref 0.30–0.70)

## 2016-07-02 LAB — MAGNESIUM: Magnesium: 2.5 mg/dL — ABNORMAL HIGH (ref 1.7–2.4)

## 2016-07-02 MED ORDER — CHLORHEXIDINE GLUCONATE CLOTH 2 % EX PADS
6.0000 | MEDICATED_PAD | Freq: Every day | CUTANEOUS | Status: DC
  Administered 2016-07-02 – 2016-07-03 (×2): 6 via TOPICAL

## 2016-07-02 MED ORDER — POLYETHYLENE GLYCOL 3350 17 G PO PACK
17.0000 g | PACK | Freq: Two times a day (BID) | ORAL | Status: DC
  Administered 2016-07-02 – 2016-07-03 (×3): 17 g via ORAL
  Filled 2016-07-02 (×3): qty 1

## 2016-07-02 MED ORDER — MORPHINE SULFATE (PF) 2 MG/ML IV SOLN
1.0000 mg | INTRAVENOUS | Status: DC | PRN
  Administered 2016-07-02 – 2016-07-03 (×5): 1 mg via INTRAVENOUS
  Filled 2016-07-02 (×5): qty 1

## 2016-07-02 MED ORDER — SODIUM CHLORIDE 0.9% FLUSH
10.0000 mL | Freq: Two times a day (BID) | INTRAVENOUS | Status: DC
  Administered 2016-07-03: 10 mL

## 2016-07-02 NOTE — Progress Notes (Signed)
Pt with scheduled lactulose, miralax and senokot. Pt having delayed and difficulty swallowing. Despite encouragement, pt only able to take a few small sips of lactulose and miralax. Pt taking pills crushed with pudding, when giving senokot, pt held pudding in mouth and had to make multiple attempts to swallow it down. No coughing observed but pt moaned in discomfort. Will monitor pt closely and pass on to oncoming nurse.

## 2016-07-02 NOTE — Progress Notes (Addendum)
ANTICOAGULATION CONSULT NOTE - Follow Up Consult  Pharmacy Consult for Heparin Indication: atrial fibrillation  Allergies  Allergen Reactions  . Lithostat [Acetohydroxamic Acid] Other (See Comments)    Unusual tiredness, weakness, nervousness, mental confusion, sore throat, Poor appetite    Patient Measurements: Height: 5\' 2"  (157.5 cm) Weight: 259 lb 0.7 oz (117.5 kg) IBW/kg (Calculated) : 50.1 Heparin Dosing Weight: 77kg  Vital Signs: Temp: 96 F (35.6 C) (02/11 0800) Temp Source: Axillary (02/11 0800) BP: 111/51 (02/11 0930) Pulse Rate: 73 (02/11 0930)  Labs:  Recent Labs  06/30/16 0258 07/01/16 0337 07/01/16 0645 07/01/16 1648 07/02/16 0430  HGB 11.5* 11.1*  --   --  12.0  HCT 36.6 35.1*  --   --  37.8  PLT 215 197  --   --  188  LABPROT 14.1  --   --   --   --   INR 1.08  --   --   --   --   HEPARINUNFRC 0.71* >2.20* 0.43  --  0.53  CREATININE 2.62* 2.04*  --  2.07* 1.76*  1.75*    Estimated Creatinine Clearance: 26.4 mL/min (by C-G formula based on SCr of 1.76 mg/dL (H)).    Assessment: 89yof on xarelto pta for hx DVT, transitioned to heparin in setting of renal failure and new onset afib. Coumadin started 2/7 but stopped 2/9 for HD cath placement. Heparin drip 1000 uts/hr  level at goal 0.5.  CBC stable, no bleeding noted. S/p DCCV > SR back in Afib RVR 130s  2/9 after HD - remains on amiodarone drip.  Goal of Therapy:  Heparin level 0.3-0.7 INR 2-3 Monitor platelets by anticoagulation protocol: Yes   Plan:  Continue heparin to 1000 units/hr Daily heparin level, CBC  Bonnita Nasuti Pharm.D. CPP, BCPS Clinical Pharmacist [phone] 07/02/2016 10:19 AM

## 2016-07-02 NOTE — Progress Notes (Signed)
Advanced Heart Failure Rounding Note  PCP:  Primary Cardiologist: Dr. Angelena Form   Subjective:    S/P TEE/DCCV 2/7  Trialysis catheter placed 2/9.   Now on CVVHD. Had good day yesterday. -3.2L. Breathing better - off Venni mask - now on Cassandra.  SBP stable on midodrine 5mg  tid.   Intermittent pain. Requiring morphine. Urine output poor.   Objective:   Weight Range: 117.5 kg (259 lb 0.7 oz) Body mass index is 47.38 kg/m.   Vital Signs:   Temp:  [96 F (35.6 C)-98.1 F (36.7 C)] 96 F (35.6 C) (02/11 0800) Pulse Rate:  [29-135] 73 (02/11 0930) Resp:  [13-23] 16 (02/11 0930) BP: (93-141)/(42-99) 111/51 (02/11 0930) SpO2:  [83 %-100 %] 96 % (02/11 0930) FiO2 (%):  [40 %] 40 % (02/10 1201) Weight:  [117.5 kg (259 lb 0.7 oz)-120.4 kg (265 lb 6.9 oz)] 117.5 kg (259 lb 0.7 oz) (02/11 0600) Last BM Date: 06/25/16  Weight change: Filed Weights   06/30/16 1558 07/01/16 1145 07/02/16 0600  Weight: 118.1 kg (260 lb 5.8 oz) 120.4 kg (265 lb 6.9 oz) 117.5 kg (259 lb 0.7 oz)    Intake/Output:   Intake/Output Summary (Last 24 hours) at 07/02/16 1015 Last data filed at 07/02/16 0900  Gross per 24 hour  Intake            892.5 ml  Output             4546 ml  Net          -3653.5 ml     Physical Exam:  General: Elderly female. On [place]. Awake. responsive HEENT: normal Neck: supple. JVP to jaw . R trialysis cath Carotids 2+ bilat; No  bruits. No lymphadenopathy or thyromegaly appreciated. Cor: PMI nondisplaced. Irreg. Lungs: clear anteriorly  Abdomen: obese soft, nontender, nondistended. No hepatosplenomegaly. No bruits or masses. Good bowel sounds. Extremities: no cyanosis, clubbing, rash, 2+ pretibial edema R arm mildly edematous Neuro: Drowsy/ oriented x3, cranial nerves grossly intact. moves all 4 extremities w/o difficulty. Affect pleasant   Telemetry: AF 90s  Labs: CBC  Recent Labs  07/01/16 0337 07/02/16 0430  WBC 9.2 8.0  HGB 11.1* 12.0  HCT 35.1* 37.8  MCV  90.0 91.7  PLT 197 0000000   Basic Metabolic Panel  Recent Labs  07/01/16 1648 07/02/16 0430  NA 131* 134*  133*  K 3.9 4.0  4.0  CL 91* 95*  94*  CO2 29 30  30   GLUCOSE 136* 120*  118*  BUN 36* 27*  27*  CREATININE 2.07* 1.76*  1.75*  CALCIUM 9.4 9.2  9.2  MG  --  2.5*  PHOS 4.0 3.6       Medications:     Scheduled Medications: . Chlorhexidine Gluconate Cloth  6 each Topical Daily  . feeding supplement (NEPRO CARB STEADY)  237 mL Oral TID BM  . lactulose  20 g Oral BID  . midodrine  5 mg Oral TID WC  . pantoprazole  40 mg Oral Daily  . polyethylene glycol  17 g Oral BID  . senna-docusate  1 tablet Oral BID  . [START ON 07/03/2016] sodium chloride flush  10 mL Intracatheter Q12H  . sodium chloride flush  3 mL Intravenous Q12H  . sodium chloride irrigation  70 mL Irrigation See admin instructions    Infusions: . amiodarone 30 mg/hr (07/02/16 [id number])  . heparin 1,000 Units/hr (07/01/16 2028)  . phenylephrine (NEO-SYNEPHRINE) Adult infusion    .  dialysis replacement fluid (prismasate) 300 mL/hr at 07/02/16 0522  . dialysis replacement fluid (prismasate) 300 mL/hr at 07/02/16 0535  . dialysate (PRISMASATE) 1,500 mL/hr at 07/02/16 0535    PRN Medications: acetaminophen, ALPRAZolam, heparin, hydrocortisone cream, lidocaine (PF), morphine injection, ondansetron **OR** ondansetron (ZOFRAN) IV, sodium chloride, traMADol   Assessment/Plan   1. Acute on chronic diastolic CHF- ECHO EF [id number].  2. Hypertensive heart disease 3. New onset atrial fibrillation     --CHADSVASC --s/p TEE/DCCV on 2/8 --reverted to AF 2/10 4. Acute on chronic CKD stage IV --CVVHD initiated 2/9 5. History of DVT on chronic anticoagulation 6. Morbid obesity 7. Hypercarbic/hypoxic respiratory failure  Respiratory status improved with fluid removal via CVVHD. Still with volume overload. Will continue to pull. Continue midodrine for BP support. Can titrate to 10 tid as neededCan also use Neo  as needed.   She is back in AF. Rate with improved control on amio drip and with improvement of resp status. Continue heparin. Can retry DC-CV after further amio load but given LA size unlikely to hold for long. Can consider AVN ablation and CRT-P as last option but I am less inclined toward this given age and comorbidities.. Not using b-blocker od CCB due to hypotension.   Overall, remains tenuous but improved with fluid removal. I discussed GOC with her family yesterday. Today I explained that we would need 2-3 more days of fluid removal with CVVHD and then would stop dialysis and see if kidneys recover. If no renal recovery then would need Hospice.  The patient is critically ill with multiple organ systems failure and requires high complexity decision making for assessment and support, frequent evaluation and titration of therapies, application of advanced monitoring technologies and extensive interpretation of multiple databases.   Critical Care Time devoted to patient care services described in this note is 35 Minutes.     Length of Stay: 9   Glori Bickers, MD  07/02/2016, 10:15 AM  Advanced Heart Failure Team Pager [phone] (M-F; 7a - 4p)  Please contact [facility] for night-coverage after hours (4p -7a ) and weekends on [email or web address]  Patient seen and examined with Darrick Grinder, NP. We discussed all aspects of the encounter. I agree with the assessment and plan as stated above.   She remains in NSR but respiratory status continues to worsen. Required Bipap overnight. Respiratory acidosis on ABG. Not responding to high-dose diuretics. I think only options may be short-term CVVHD or palliative care. Await Renal input. I am hesistant to start renal-dose dopamine given recent DC-CV and risk of recurrent AF. Hold warfarin. Continue heparin.   Kinsley Nicklaus,MD 10:15 AM

## 2016-07-02 NOTE — Progress Notes (Signed)
Patient ID: Christina Martin, female   DOB: [birth year], 83 y.o.   MRN: [id number]  PROGRESS NOTE    KAMORAH DEMBO  [id number] DOB: 1925/01/27 DOA: 07/15/2016  PCP: [name], MD   Brief Narrative:  83 y.o.femalewith medical history significant for chronic diastolic CHF (last 2 D ECHO in 03/2015 showed EF 65% and grade 1 DD), CKD stage 4 with baseline creatinine 1.80 back in 03/2015, hypertension, GERD. She presented to [facility] with worsening lower extremity swelling and difficulty ambulating over past 2 weeks prior to this admission. She also had shortness of breath with exertion. She took extra dose of lasix for past 1 week without significant symptomatic relief.  Pt was hemodynamically stable. Her blood work showed potassium of 5.2, Cr 2.15, troponin 0.04. The 12 lead EKG showed atrial fibrillation. She was started on IV lasix 60 mg IV Q 12 hours. Cardio seen in consultation. Due to renal insufficiency nephrology has been consulted as well.   Assessment & Plan:    Acute on chronic diastolic heart failure  And AKI - ECHO with a EF of 60-65%, no wall motion abnormalities, moderate left atrial enlargement and severe RAE, moderate TR IVC dilated. -was on milrinone and high dose IV lasix -160mg  Q8, urine output sluggish, Renal following, s/p HD catheter and HD/UF x1 then started on CRRT - she would be a very poor long term HD candidate, palliative following and needs further discussion before long term HD plans made, I discussed this with son and daughter for 2days abt poor prognosis - CRRT per Renal, urine output dropping now, 110cc in 24H -Palliative following  -Acute renal failure superimposed on stage 4 chronic kidney disease (HCC) - Cr baseline is 1.6 on 02/2016 and on this admission 2.15 which has worsened to 2.68  - Renal ultrasound with mild right renal atrophy. No hydronephrosis or renal obstruction noted. - see discussion above regarding ultrafiltration/HD -not a good  long term HD candidate -Palliative following, Mary to re-eval on Monday    New onset atrial fibrillation - CHADS vasc score 5, continue amiodarone drip - Initially on xarelto but this was placed on hold due to renal insufficiency so she is on heparin drip - s/p cardioversion, converted to NSR, now back in Afib -on amio gtt    Hx of Left leg DVT 08-2014 - Decreased the dose to 10 mg a day by hematology and she was supposed to be on it through 08/2016.  - Xarelto on hold due to renal insufficiency  - On heparin drip  - Lower extremity Doppler negative for DVT  Essential hypertension - Lisinopril on hold due to renal insufficiency  - On IV lasix, midodrine   Hyperkalemia - improved  Elevated troponin - Likely demand ischemia due to CKD, acute CHF - Troponin 0.04, 0.05 and 0.04 - No plans for ischemic workup at this time  GERD without esophagitis - Continue protonix   Anemia of chronic kidney disease - stable, monitor   Constipation -increase lactulose, add dulcolax  DVT prophylaxis: On heparin drip  Code Status: DNR/DNI  Family Communication: Family at the bedside this am Disposition Plan: SNF when stable   Consultants:   Cardiology: Dr Debara Pickett 06/24/2016  Renal  Procedures:   Renal ultrasound 06/25/2016  Lower extremity dopplers 06/24/2016  2-D echo 06/24/2016 - EF 60%  Antimicrobials:   None   Subjective: Restless uncomfortable, short of breath and constipated, no BM yet  Objective: Vitals:   07/02/16 1030 07/02/16 1100  07/02/16 1136 07/02/16 1200  BP: (!) 124/50 (!) 117/52 (!) 140/129 (!) 102/57  Pulse: 81 86 (!) 118 76  Resp: 15 11 19  (!) 25  Temp:   97.4 F (36.3 C)   TempSrc:   Oral   SpO2: 100% 98% 97% 100%  Weight:      Height:        Intake/Output Summary (Last 24 hours) at 07/02/16 1227 Last data filed at 07/02/16 1200  Gross per 24 hour  Intake            890.8 ml  Output             5232 ml  Net           -4341.2 ml   Filed Weights   06/30/16 1558 07/01/16 1145 07/02/16 0600  Weight: 118.1 kg (260 lb 5.8 oz) 120.4 kg (265 lb 6.9 oz) 117.5 kg (259 lb 0.7 oz)    Examination:  General exam: AAOx1, uncomfortable  Respiratory system:  basilar crackles Cardiovascular system: S1 & S2 heard, slight tachycardia  Gastrointestinal system: A(+) BS, non tender abd  Central nervous system: No focal neurological deficits. Extremities: +2 LE pitting edema, redness of bilateral extremities with chronic venous skin changes  Skin: Skin is warm and dry   Psychiatry: Normal mood and behavior   Data Reviewed: I have personally reviewed following labs and imaging studies  CBC:  Recent Labs Lab 06/25/16 1412  07/03/2016 0349 06/29/16 0240 06/30/16 0258 07/01/16 0337 07/02/16 0430  WBC  --   < > 9.0 9.7 11.7* 9.2 8.0  NEUTROABS 4.3  --   --   --   --   --   --   HGB  --   < > 11.2* 11.8* 11.5* 11.1* 12.0  HCT  --   < > 34.9* 36.9 36.6 35.1* 37.8  MCV  --   < > 90.4 91.1 91.7 90.0 91.7  PLT  --   < > 194 210 215 197 188  < > = values in this interval not displayed. Basic Metabolic Panel:  Recent Labs Lab 06/26/16 0320  06/29/16 0825 06/30/16 0258 07/01/16 0337 07/01/16 1648 07/02/16 0430  NA 136  < > 135 133* 131* 131* 134*  133*  K 4.4  < > 4.3 4.0 3.7 3.9 4.0  4.0  CL 99*  < > 94* 91* 91* 91* 95*  94*  CO2 29  < > 28 29 26 29 30  30   GLUCOSE 102*  < > 135* 156* 115* 136* 120*  118*  BUN 51*  < > 60* 67* 39* 36* 27*  27*  CREATININE 2.65*  < > 2.45* 2.62* 2.04* 2.07* 1.76*  1.75*  CALCIUM 8.9  < > 9.4 9.5 9.0 9.4 9.2  9.2  MG 2.1  --   --   --   --   --  2.5*  PHOS 4.6  --   --   --   --  4.0 3.6  < > = values in this interval not displayed. GFR: Estimated Creatinine Clearance: 26.4 mL/min (by C-G formula based on SCr of 1.76 mg/dL (H)). Liver Function Tests:  Recent Labs Lab 06/26/16 0320 07/01/16 1648 07/02/16 0430  AST 32  --   --   ALT 26  --   --   ALKPHOS 86  --    --   BILITOT 0.5  --   --   PROT 5.9*  --   --  ALBUMIN 2.9* 2.9* 2.7*   No results for input(s): LIPASE, AMYLASE in the last 168 hours. No results for input(s): AMMONIA in the last 168 hours. Coagulation Profile:  Recent Labs Lab 06/29/16 0240 06/30/16 0258  INR 1.07 1.08   Cardiac Enzymes: No results for input(s): CKTOTAL, CKMB, CKMBINDEX, TROPONINI in the last 168 hours. BNP (last 3 results) No results for input(s): PROBNP in the last 8760 hours. HbA1C: No results for input(s): HGBA1C in the last 72 hours. CBG:  Recent Labs Lab 06/25/16 2158 06/26/16 0825  GLUCAP 114* 99   Lipid Profile: No results for input(s): CHOL, HDL, LDLCALC, TRIG, CHOLHDL, LDLDIRECT in the last 72 hours. Thyroid Function Tests: No results for input(s): TSH, T4TOTAL, FREET4, T3FREE, THYROIDAB in the last 72 hours. Anemia Panel: No results for input(s): VITAMINB12, FOLATE, FERRITIN, TIBC, IRON, RETICCTPCT in the last 72 hours. Urine analysis:    Component Value Date/Time   COLORURINE YELLOW 07/12/2016 1834   APPEARANCEUR CLOUDY (A) 07/11/2016 1834   LABSPEC 1.013 07/14/2016 1834   PHURINE 5.0 06/22/2016 1834   GLUCOSEU NEGATIVE 07/02/2016 1834   HGBUR SMALL (A) 07/08/2016 1834   BILIRUBINUR NEGATIVE 07/03/2016 1834   KETONESUR NEGATIVE 07/17/2016 1834   PROTEINUR NEGATIVE 06/29/2016 1834   NITRITE NEGATIVE 07/06/2016 1834   LEUKOCYTESUR LARGE (A) 06/27/2016 1834   Sepsis Labs: @LABRCNTIP (procalcitonin:4,lacticidven:4)    Urine culture     Status: Abnormal   Collection Time: 06/25/16  8:16 AM  Result Value Ref Range Status   Specimen Description URINE, SUPRAPUBIC  Final   Special Requests NONE  Final   Culture MULTIPLE SPECIES PRESENT, SUGGEST RECOLLECTION (A)  Final   Report Status 06/26/2016 FINAL  Final  MRSA PCR Screening     Status: None   Collection Time: 06/25/16  5:07 PM  Result Value Ref Range Status   MRSA by PCR NEGATIVE NEGATIVE Final      Radiology  Studies: US Renal Result Date: 06/25/2016 Mild right renal atrophy. No hydronephrosis or renal obstruction is noted. Electronically Signed   By: Marijo Conception, M.D.   On: 06/25/2016 08:57     Scheduled Meds: . docusate sodium  100 mg Oral Daily  . feeding supplement   237 mL Oral BID BM  . furosemide  100 mg Intravenous Q8H  . midodrine  5 mg Oral TID WC  . multivitamin with min  1 tablet Oral Daily  . pantoprazole  40 mg Oral Daily   Continuous Infusions: . amiodarone 30 mg/hr (07/02/16 [id number])  . heparin 1,000 Units/hr (07/01/16 2028)  . phenylephrine (NEO-SYNEPHRINE) Adult infusion    . dialysis replacement fluid (prismasate) 300 mL/hr at 07/02/16 0522  . dialysis replacement fluid (prismasate) 300 mL/hr at 07/02/16 0535  . dialysate (PRISMASATE) 1,500 mL/hr at 07/02/16 0535     LOS: 9 days    Time spent: 35 minutes  Greater than 50% of the time spent on counseling and coordinating the care.   Domenic Polite, MD [facility] Pager [phone]  If 7PM-7AM, please contact night-coverage [email or web address] Password [facility] 07/02/2016, 12:27 PM

## 2016-07-02 NOTE — Progress Notes (Signed)
Subjective: Interval History: Sleepy this morning but arousable. BPs more stable.   Objective: Vital signs in last 24 hours: Temp:  [97.3 F (36.3 C)-98.1 F (36.7 C)] 97.4 F (36.3 C) (02/11 0300) Pulse Rate:  [29-135] 87 (02/11 0300) Resp:  [13-23] 15 (02/11 0700) BP: (93-141)/(42-99) 94/56 (02/11 0700) SpO2:  [83 %-100 %] 96 % (02/11 0400) FiO2 (%):  [40 %] 40 % (02/10 1201) Weight:  [259 lb 0.7 oz (117.5 kg)-265 lb 6.9 oz (120.4 kg)] 259 lb 0.7 oz (117.5 kg) (02/11 0600) Weight change: 5 lb 1.1 oz (2.3 kg)  Intake/Output from previous day: 02/10 0701 - 02/11 0700 In: 868.5 [P.O.:155; I.V.:643.5] Out: 1194 [Urine:110] Intake/Output this shift: No intake/output data recorded.  Gen: lying in bed, sleepy but awakens to voice and stimuli  Neck: JVD present  CV: RRR. No murmurs appreciated.  Lungs: Bibasilar crackles. Increased WOB. VOn [place].  GI: soft, NTND  Ext: 2-3+ bilateral LE edema with overlying tight skin, venous stasis changes present of skin    Lab Results:  Recent Labs  07/01/16 0337 07/02/16 0430  WBC 9.2 8.0  HGB 11.1* 12.0  HCT 35.1* 37.8  PLT 197 188   BMET:   Recent Labs  07/01/16 1648 07/02/16 0430  NA 131* 134*  133*  K 3.9 4.0  4.0  CL 91* 95*  94*  CO2 '29 30  30  '$ GLUCOSE 136* 120*  118*  BUN 36* 27*  27*  CREATININE 2.07* 1.76*  1.75*  CALCIUM 9.4 9.2  9.2   No results for input(s): PTH in the last 72 hours. Iron Studies:  No results for input(s): IRON, TIBC, TRANSFERRIN, FERRITIN in the last 72 hours. Studies/Results: Ir Fluoro Guide Cv Line Right  Result Date: 06/30/2016 INDICATION: End-stage renal disease. In need of intravenous access for the initiation of dialysis. EXAM: NON-TUNNELED CENTRAL VENOUS HEMODIALYSIS CATHETER PLACEMENT WITH ULTRASOUND AND FLUOROSCOPIC GUIDANCE COMPARISON:  None. MEDICATIONS: None FLUOROSCOPY TIME:  1 minutes, 24 seconds (18 mGy) COMPLICATIONS: None immediate. PROCEDURE: Informed written consent was  obtained from the patient after a discussion of the risks, benefits, and alternatives to treatment. Questions regarding the procedure were encouraged and answered. The right neck and chest were prepped with chlorhexidine in a sterile fashion, and a sterile drape was applied covering the operative field. Maximum barrier sterile technique with sterile gowns and gloves were used for the procedure. A timeout was performed prior to the initiation of the procedure. After creating a small venotomy incision, a micropuncture kit was utilized to access the right internal jugular vein under direct, real-time ultrasound guidance after the overlying soft tissues were anesthetized with 1% lidocaine with epinephrine. Ultrasound image documentation was performed. The microwire was kinked to measure appropriate catheter length. A stiff glidewire was advanced to the level of the IVC. Under fluoroscopic guidance, the venotomy was serially dilated, ultimately allowing placement of a 20 cm temporary Trialysis catheter with tip ultimately terminating within the superior aspect of the right atrium. Final catheter positioning was confirmed and documented with a spot radiographic image. The catheter aspirates and flushes normally. The catheter was flushed with appropriate volume heparin dwells. The catheter exit site was secured with a 0-Prolene retention suture. A dressing was placed. The patient tolerated the procedure well without immediate post procedural complication. IMPRESSION: Successful placement of a right internal jugular approach 20 cm temporary dialysis catheter with tip terminating with in the superior aspect of the right atrium. The catheter is ready for immediate use. PLAN: This  catheter may be converted to a tunneled dialysis catheter at a later date as indicated. Electronically Signed   By: Sandi Mariscal M.D.   On: 06/30/2016 13:22   Ir US Guide Vasc Access Right  Result Date: 06/30/2016 INDICATION: End-stage renal  disease. In need of intravenous access for the initiation of dialysis. EXAM: NON-TUNNELED CENTRAL VENOUS HEMODIALYSIS CATHETER PLACEMENT WITH ULTRASOUND AND FLUOROSCOPIC GUIDANCE COMPARISON:  None. MEDICATIONS: None FLUOROSCOPY TIME:  1 minutes, 24 seconds (18 mGy) COMPLICATIONS: None immediate. PROCEDURE: Informed written consent was obtained from the patient after a discussion of the risks, benefits, and alternatives to treatment. Questions regarding the procedure were encouraged and answered. The right neck and chest were prepped with chlorhexidine in a sterile fashion, and a sterile drape was applied covering the operative field. Maximum barrier sterile technique with sterile gowns and gloves were used for the procedure. A timeout was performed prior to the initiation of the procedure. After creating a small venotomy incision, a micropuncture kit was utilized to access the right internal jugular vein under direct, real-time ultrasound guidance after the overlying soft tissues were anesthetized with 1% lidocaine with epinephrine. Ultrasound image documentation was performed. The microwire was kinked to measure appropriate catheter length. A stiff glidewire was advanced to the level of the IVC. Under fluoroscopic guidance, the venotomy was serially dilated, ultimately allowing placement of a 20 cm temporary Trialysis catheter with tip ultimately terminating within the superior aspect of the right atrium. Final catheter positioning was confirmed and documented with a spot radiographic image. The catheter aspirates and flushes normally. The catheter was flushed with appropriate volume heparin dwells. The catheter exit site was secured with a 0-Prolene retention suture. A dressing was placed. The patient tolerated the procedure well without immediate post procedural complication. IMPRESSION: Successful placement of a right internal jugular approach 20 cm temporary dialysis catheter with tip terminating with in the  superior aspect of the right atrium. The catheter is ready for immediate use. PLAN: This catheter may be converted to a tunneled dialysis catheter at a later date as indicated. Electronically Signed   By: Sandi Mariscal M.D.   On: 06/30/2016 13:22    I have reviewed the patient's current medications.  Assessment/Plan: Christina Martin is 83 y.o. female with PMH of chronic diastolic CHF, CKD4, neurogenic bladder with SPC in place,  And chronic DVT on Xarelto who was admitted with lower extremity swelling and weakness. Patient was found to be in Afib with RVR. She has received diuresis for an acute exacerbation of CHF. Found to have an AKI at admission that worsened, so nephrology became involved in her care.   AKI: Suspect low perfusion given persistent hypotension complicated by use of AceI at home. Baseline Cr 1.6-1.8. Patient remains volume overloaded. UOP diminishing with CRRT but able to remove 4L of volume yesterday. Cr unchanged. BPs have improved. If BP continued to remain stable may attempt HD again.   Hypotension: With improvement in BP stability.  Requiring Midodrine. Anemia: With low-normal Fe and saturation ratio of 13%. Would give Feraheme.  AFib with RVR: Rate controlled with amiodarone drip. IV heparin. S/p CV yesterday and remained in NSR overnight.  Acute CHF exacerbation: With volume excess. CXR obtained yesterday significant for effusions. Patient with respiratory acidosis requiring BiPAP/venturi mask yesterday.    LOS: 9 days   [name] 07/02/2016,7:53 AM   Renal Attending:  Stable with CRRT with good volume removal and improved hemodynamics.  Recurrent Afib. 2D echo 06/29/16 showed an  EF> 60%. Currently difficult to assess mental status as pt got morphine overnight for some discomfort.  Family reports she was living in her home with intermittent help.  Would continue CRRT to optimize pulmonary status/volume overload, and make clinical decision about dialysis depending  upon her response and  clinical state. Jamicheal Heard C      .

## 2016-07-02 NOTE — Progress Notes (Signed)
SLP Cancellation Note  Patient Details Name: Christina Martin MRN: [id number] DOB: Mar 11, 1925   Cancelled treatment:       Reason Eval/Treat Not Completed: Patient not medically ready. Hold off per Dr. Broadus John, will reorder if needed in the future.    Arben Packman, Katherene Ponto 07/02/2016, 9:49 AM

## 2016-07-03 ENCOUNTER — Inpatient Hospital Stay (HOSPITAL_COMMUNITY): Payer: Medicare Other

## 2016-07-03 DIAGNOSIS — N179 Acute kidney failure, unspecified: Secondary | ICD-10-CM

## 2016-07-03 LAB — RENAL FUNCTION PANEL
ANION GAP: 9 (ref 5–15)
Albumin: 2.6 g/dL — ABNORMAL LOW (ref 3.5–5.0)
Albumin: 2.8 g/dL — ABNORMAL LOW (ref 3.5–5.0)
Anion gap: 8 (ref 5–15)
BUN: 18 mg/dL (ref 6–20)
BUN: 18 mg/dL (ref 6–20)
CALCIUM: 9.4 mg/dL (ref 8.9–10.3)
CHLORIDE: 98 mmol/L — AB (ref 101–111)
CHLORIDE: 99 mmol/L — AB (ref 101–111)
CO2: 29 mmol/L (ref 22–32)
CO2: 26 mmol/L (ref 22–32)
CREATININE: 1.56 mg/dL — AB (ref 0.44–1.00)
CREATININE: 1.68 mg/dL — AB (ref 0.44–1.00)
Calcium: 9.1 mg/dL (ref 8.9–10.3)
GFR calc Af Amer: 33 mL/min — ABNORMAL LOW (ref >60)
GFR calc non Af Amer: 28 mL/min — ABNORMAL LOW (ref >60)
GFR, EST AFRICAN AMERICAN: 30 mL/min — AB (ref >60)
GFR, EST NON AFRICAN AMERICAN: 26 mL/min — AB (ref >60)
Glucose, Bld: 107 mg/dL — ABNORMAL HIGH (ref 65–99)
Glucose, Bld: 112 mg/dL — ABNORMAL HIGH (ref 65–99)
POTASSIUM: 4.4 mmol/L (ref 3.5–5.1)
Phosphorus: 3.1 mg/dL (ref 2.5–4.6)
Phosphorus: 3.3 mg/dL (ref 2.5–4.6)
Potassium: 4.2 mmol/L (ref 3.5–5.1)
Sodium: 135 mmol/L (ref 135–145)
Sodium: 134 mmol/L — ABNORMAL LOW (ref 135–145)

## 2016-07-03 LAB — MAGNESIUM: Magnesium: 2.6 mg/dL — ABNORMAL HIGH (ref 1.7–2.4)

## 2016-07-03 LAB — BASIC METABOLIC PANEL
ANION GAP: 9 (ref 5–15)
BUN: 18 mg/dL (ref 6–20)
CALCIUM: 9.1 mg/dL (ref 8.9–10.3)
CO2: 28 mmol/L (ref 22–32)
Chloride: 98 mmol/L — ABNORMAL LOW (ref 101–111)
Creatinine, Ser: 1.58 mg/dL — ABNORMAL HIGH (ref 0.44–1.00)
GFR calc Af Amer: 32 mL/min — ABNORMAL LOW (ref >60)
GFR, EST NON AFRICAN AMERICAN: 28 mL/min — AB (ref >60)
GLUCOSE: 106 mg/dL — AB (ref 65–99)
Potassium: 4.3 mmol/L (ref 3.5–5.1)
Sodium: 135 mmol/L (ref 135–145)

## 2016-07-03 LAB — HEPARIN LEVEL (UNFRACTIONATED): Heparin Unfractionated: 0.44 IU/mL (ref 0.30–0.70)

## 2016-07-03 LAB — CBC
HCT: 37.6 % (ref 36.0–46.0)
Hemoglobin: 11.8 g/dL — ABNORMAL LOW (ref 12.0–15.0)
MCH: 29.1 pg (ref 26.0–34.0)
MCHC: 31.4 g/dL (ref 30.0–36.0)
MCV: 92.8 fL (ref 78.0–100.0)
PLATELETS: 166 10*3/uL (ref 150–400)
RBC: 4.05 MIL/uL (ref 3.87–5.11)
RDW: 16.4 % — AB (ref 11.5–15.5)
WBC: 9.2 10*3/uL (ref 4.0–10.5)

## 2016-07-03 MED ORDER — BISACODYL 10 MG RE SUPP
10.0000 mg | Freq: Once | RECTAL | Status: AC
  Administered 2016-07-03: 10 mg via RECTAL
  Filled 2016-07-03: qty 1

## 2016-07-03 MED ORDER — MIDAZOLAM HCL 5 MG/ML IJ SOLN
5.0000 mg/h | INTRAMUSCULAR | Status: DC
  Administered 2016-07-03: 5 mg/h via INTRAVENOUS
  Filled 2016-07-03: qty 10

## 2016-07-03 MED ORDER — SODIUM CHLORIDE 0.9 % IV SOLN
5.0000 mg/h | INTRAVENOUS | Status: DC
  Administered 2016-07-03: 5 mg/h via INTRAVENOUS
  Filled 2016-07-03: qty 10

## 2016-07-03 MED ORDER — MORPHINE SULFATE (PF) 4 MG/ML IV SOLN
4.0000 mg | INTRAVENOUS | Status: DC | PRN
Start: 2016-07-03 — End: 2016-07-04

## 2016-07-03 MED ORDER — MIDAZOLAM HCL 2 MG/2ML IJ SOLN: 2.0000 mg | INTRAMUSCULAR | Status: DC | PRN

## 2016-07-03 MED ORDER — MILK AND MOLASSES ENEMA
1.0000 | Freq: Once | RECTAL | Status: AC
  Administered 2016-07-03: 250 mL via RECTAL
  Filled 2016-07-03: qty 250

## 2016-07-03 NOTE — Evaluation (Signed)
Clinical/Bedside Swallow Evaluation Patient Details  Name: Christina Martin MRN: [id number] Date of Birth: June 01, 1924  Today's Date: 07/03/2016 Time: SLP Start Time (ACUTE ONLY): 1025 SLP Stop Time (ACUTE ONLY): 1055 SLP Time Calculation (min) (ACUTE ONLY): 30 min  Past Medical History:  Past Medical History:  Diagnosis Date  . ATONIC BLADDER-- has a permananet suprapubic catheter    . Calculus in urethra    Dr. Diona Fanti  . Diastolic dysfunction, left ventricle 05/30/2013   05/2013 see ECHO   . DJD (degenerative joint disease)   . GERD (gastroesophageal reflux disease)   . HTN (hypertension) 12/30/2007  . Nonspecific elevation of levels of transaminase or lactic acid dehydrogenase (LDH) 1996   + Hepatitis srology  . Prediabetes 04/07/2014  . Uterine cancer  (h/O)    no recent f/u    Past Surgical History:  Past Surgical History:  Procedure Laterality Date  . APPENDECTOMY    . CARDIOVERSION N/A 07/14/2016   Procedure: CARDIOVERSION;  Surgeon: Jolaine Artist, MD;  Location[facility] ENDOSCOPY;  Service: Cardiovascular;  Laterality: N/A;  . cataract surgery  2014   bilateral; Dr Prudencio Burly  . COLONOSCOPY W/ POLYPECTOMY  1998   Tics in 2006; Dr Sharlett Iles  . CYSTOSCOPY  02/28/2016   3-4 stones approximately 1cm located in posterior bladder, no tumors seen  . ESOPHAGEAL DILATION  2006  . IR GENERIC HISTORICAL  06/30/2016   IR FLUORO GUIDE CV LINE RIGHT 06/30/2016 Sandi Mariscal, MD [facility]-INTERV RAD  . IR GENERIC HISTORICAL  06/30/2016   IR US GUIDE VASC ACCESS RIGHT 06/30/2016 Sandi Mariscal, MD [facility]  . POSTERIOR LAMINECTOMY / DECOMPRESSION LUMBAR SPINE  2009   Dr Lorin Mercy  . SBO  1991   adhesions resected  . TEE WITHOUT CARDIOVERSION N/A 06/24/2016   Procedure: TRANSESOPHAGEAL ECHOCARDIOGRAM (TEE);  Surgeon: Jolaine Artist, MD;  Location: [facility] ENDOSCOPY;  Service: Cardiovascular;  Laterality: N/A;  . TOTAL ABDOMINAL HYSTERECTOMY W/ BILATERAL SALPINGOOPHORECTOMY  1990   uterine cancer  . TOTAL KNEE  ARTHROPLASTY  1996 & 20052   HPI:  83 year old female admitted 07/13/2016 due to LE swelling and weakness.Marland Kitchen PMH significant for CHF, COPD, CKD4, GERD, HTN. Family and RN report no difficulty with liquids, but pt reports globus sensation with intermittent need to regurgitate with solids. CXR reveals mild CHF and hyperinflation c/w COPD. BSE ordered to evaluate swallow function and safety.    Assessment / Plan / Recommendation Clinical Impression  Oral care was completed with suction. Pt yelling out and grimacing, but when asked if she was being hurt, pt responded "no". Missing dentition noted. Pt was given trials of thin liquid, puree, and soft solid. No overt s/s aspiration observed on any consistency. Pt did report difficulty with oral prep and posterior propulsion of cracker (softened with applesauce), and slight oral residue was noted after the swallow. Pt tolerated meds crushed in puree without difficulty. Will begin puree diet and thin liquids to be given ONLY WHEN PT IS AWAKE AND ALERT. Safe swallow precautions written, reviewed with family and RN, and posted at [facility]. Pt is at increased risk for silent aspiration due to COPD, but has no report of oropharyngeal dysphagia prior to admit.  Family did report history of esophageal issues with dilation several years ago. ST will follow closely for tolerance and determine if objective study is needed.  Given continuous dialysis at bedside, objective study would need to be FEES.     Aspiration Risk  Mild aspiration risk;Moderate aspiration risk  Diet Recommendation Dysphagia 1 (Puree);Thin liquid   Liquid Administration via: Cup;Straw Medication Administration: Crushed with puree Supervision: Full supervision/cueing for compensatory strategies Compensations: Minimize environmental distractions;Small sips/bites;Slow rate Postural Changes: Seated upright at 90 degrees;Remain upright for at least 30 minutes after po intake    Other  Recommendations  Recommended Consults: Consider esophageal assessment Oral Care Recommendations: Oral care before and after PO Other Recommendations: Have oral suction available   Follow up Recommendations  (TBD)      Frequency and Duration min 2x/week  2 weeks       Prognosis Prognosis for Safe Diet Advancement: Fair Barriers to Reach Goals:  (advanced age)      Swallow Study   General Date of Onset: 07/09/2016 HPI: 83 year old female admitted 07/14/2016 due to LE swelling and weakness.Marland Kitchen PMH significant for CHF, COPD, CKD4, GERD, HTN. Family and RN report no difficulty with liquids, but pt reports globus sensation with intermittent need to regurgitate with solids. CXR reveals mild CHF and hyperinflation c/w COPD. BSE ordered to evaluate swallow function and safety.  Type of Study: Bedside Swallow Evaluation Previous Swallow Assessment: none Diet Prior to this Study:  (full liquid) Temperature Spikes Noted: No Respiratory Status: Nasal cannula History of Recent Intubation: No Behavior/Cognition: Alert;Requires cueing Oral Cavity Assessment: Within Functional Limits Oral Care Completed by SLP: Yes Oral Cavity - Dentition: Missing dentition Self-Feeding Abilities: Total assist Patient Positioning: Upright in bed Baseline Vocal Quality: Normal Volitional Cough: Weak Volitional Swallow: Able to elicit    Oral/Motor/Sensory Function Overall Oral Motor/Sensory Function: Mild impairment (generalized weakness)   Ice Chips Ice chips: Within functional limits Presentation: [place]   Thin Liquid Thin Liquid: Within functional limits Presentation: Cup;Straw    Nectar Thick Nectar Thick Liquid: Not tested   Honey Thick Honey Thick Liquid: Not tested   Puree Puree: Within functional limits Presentation: [place]   Solid   GO   Solid: Impaired Oral Phase Impairments: Impaired mastication Oral Phase Functional Implications: Prolonged oral transit;Impaired mastication;Oral residue       Christina Martin B. Quentin Ore  [facility], [place] [phone]  Shonna Chock 07/03/2016,11:08 AM

## 2016-07-03 NOTE — Progress Notes (Signed)
Physical Therapy Treatment Patient Details Name: Christina Martin MRN: [id number] DOB: 11-May-1925 Today's Date: 07-25-16    History of Present Illness Pt adm with LE edema and SOB. Pt with acute on chronic diastolic heart failure. CVVHD initiated 2/10. PMH - CHF, ckd, HTN    PT Comments    Pt remains very weak and with poor activity tolerance. Do not feel she could tolerate CIR. Feel she will need SNF at dc if she improves medically.  Follow Up Recommendations  SNF     Equipment Recommendations  None recommended by PT    Recommendations for Other Services       Precautions / Restrictions Precautions Precautions: Fall Restrictions Weight Bearing Restrictions: No    Mobility  Bed Mobility Overal bed mobility: Needs Assistance Bed Mobility: Supine to Sit     Supine to sit: +2 for physical assistance;Max assist     General bed mobility comments: Assist to bring legs off of bed and elevate trunk into sitting  Transfers                    Ambulation/Gait                 Stairs            Wheelchair Mobility    Modified Rankin (Stroke Patients Only)       Balance Overall balance assessment: Needs assistance;History of Falls Sitting-balance support: Feet supported;Bilateral upper extremity supported Sitting balance-Leahy Scale: Poor Sitting balance - Comments: Pt sat EOB x 10 minutes with mod assist initially and then progressing to min assist.  Postural control: Right lateral lean                          Cognition Arousal/Alertness: Awake/alert Behavior During Therapy: WFL for tasks assessed/performed Overall Cognitive Status: Within Functional Limits for tasks assessed                      Exercises General Exercises - Lower Extremity Long Arc Quad: AROM;Both;5 reps;Seated    General Comments        Pertinent Vitals/Pain Pain Assessment: Faces Faces Pain Scale: Hurts even more Pain Location: Pt moans and  cries out but when asked where she hurts she states she doesn't know Pain Descriptors / Indicators: Grimacing Pain Intervention(s): Monitored during session;Limited activity within patient's tolerance    Home Living                      Prior Function            PT Goals (current goals can now be found in the care plan section) Progress towards PT goals: Not progressing toward goals - comment (medical issues)    Frequency    Min 3X/week      PT Plan Discharge plan needs to be updated    Co-evaluation             End of Session Equipment Utilized During Treatment: Oxygen Activity Tolerance: Patient limited by fatigue Patient left: with call bell/phone within reach;in bed;with family/visitor present     Time: [id number] PT Time Calculation (min) (ACUTE ONLY): 19 min  Charges:  $Therapeutic Activity: 8-22 mins                    [facility] 07/25/2016, 1:28 PM [employer] PT [phone]

## 2016-07-03 NOTE — Progress Notes (Signed)
ANTICOAGULATION CONSULT NOTE - Follow Up Consult  Pharmacy Consult for Heparin Indication: atrial fibrillation  Allergies  Allergen Reactions  . Lithostat [Acetohydroxamic Acid] Other (See Comments)    Unusual tiredness, weakness, nervousness, mental confusion, sore throat, Poor appetite    Patient Measurements: Height: 5\' 2"  (157.5 cm) Weight: 247 lb 5.7 oz (112.2 kg) IBW/kg (Calculated) : 50.1 Heparin Dosing Weight: 77kg  Vital Signs: Temp: 97.6 F (36.4 C) (02/12 0400) Temp Source: Axillary (02/12 0400) BP: 100/47 (02/12 0700) Pulse Rate: 97 (02/12 0700)  Labs:  Recent Labs  07/01/16 0337 07/01/16 0645  07/02/16 0430 07/02/16 1622 07/03/16 0415  HGB 11.1*  --   --  12.0  --  11.8*  HCT 35.1*  --   --  37.8  --  37.6  PLT 197  --   --  188  --  166  HEPARINUNFRC >2.20* 0.43  --  0.53  --  0.44  CREATININE 2.04*  --   < > 1.76*  1.75* 1.64* 1.58*  1.56*  < > = values in this interval not displayed.  Estimated Creatinine Clearance: 28.5 mL/min (by C-G formula based on SCr of 1.58 mg/dL (H)).    Assessment: 89yof on xarelto pta for hx DVT, transitioned to heparin in setting of renal failure and new onset afib. Coumadin started 2/7 but stopped 2/9 for HD cath placement.  Heparin drip 1000 uts/hr  level at goal 0.44.  CBC stable, no bleeding noted. S/p DCCV > SR back in Afib RVR 100s - remains on amiodarone drip.  Goal of Therapy:  Heparin level 0.3-0.7 INR 2-3 Monitor platelets by anticoagulation protocol: Yes   Plan:  Continue heparin to 1000 units/hr Daily heparin level, CBC  Erin Hearing PharmD., BCPS Clinical Pharmacist Pager [phone] 07/03/2016 7:17 AM

## 2016-07-03 NOTE — Procedures (Signed)
Admit: 07/09/2016 LOS: 10  68F AoC DHF exacerbatino, new AFib with RR, AoCKD4; massive volume overload  Current CRRT Prescription: Start Date: 06/30/16 Catheter: R IJ HD Cath placed 2/9 BFR: 100 Pre Blood Pump: 300 4K DFR: 1500 4K Replacement Rate: 3004K Goal UF: up to 274mL neg/hr Anticoagulation: systemic heparin Clotting: none    S: Tol CRRT well, excellent UF Family -- 2 sons updated at bedside  O: 02/11 0701 - 02/12 0700 In: 695.8 [P.O.:55; I.V.:640.8] Out: 5536 [Urine:25]  Filed Weights   07/01/16 1145 07/02/16 0600 07/03/16 0600  Weight: 120.4 kg (265 lb 6.9 oz) 117.5 kg (259 lb 0.7 oz) 112.2 kg (247 lb 5.7 oz)     Recent Labs Lab 07/02/16 0430 07/02/16 1622 07/03/16 0415  NA 134*  133* 134* 135  135  K 4.0  4.0 4.3 4.3  4.4  CL 95*  94* 97* 98*  98*  CO2 30  30 28 28  29   GLUCOSE 120*  118* 134* 106*  107*  BUN 27*  27* 21* 18  18  CREATININE 1.76*  1.75* 1.64* 1.58*  1.56*  CALCIUM 9.2  9.2 9.3 9.1  9.1  PHOS 3.6 3.5 3.1    Recent Labs Lab 07/01/16 0337 07/02/16 0430 07/03/16 0415  WBC 9.2 8.0 9.2  HGB 11.1* 12.0 11.8*  HCT 35.1* 37.8 37.6  MCV 90.0 91.7 92.8  PLT 197 188 166    Scheduled Meds: . Chlorhexidine Gluconate Cloth  6 each Topical Daily  . feeding supplement (NEPRO CARB STEADY)  237 mL Oral TID BM  . lactulose  20 g Oral BID  . midodrine  5 mg Oral TID WC  . pantoprazole  40 mg Oral Daily  . polyethylene glycol  17 g Oral BID  . senna-docusate  1 tablet Oral BID  . sodium chloride flush  10 mL Intracatheter Q12H  . sodium chloride flush  3 mL Intravenous Q12H  . sodium chloride irrigation  70 mL Irrigation See admin instructions   Continuous Infusions: . amiodarone 30 mg/hr (07/03/16 1300)  . heparin 1,000 Units/hr (07/03/16 1300)  . phenylephrine (NEO-SYNEPHRINE) Adult infusion Stopped (07/03/16 0804)  . dialysis replacement fluid (prismasate) 300 mL/hr at 07/02/16 2227  . dialysis replacement fluid  (prismasate) 300 mL/hr at 07/02/16 2233  . dialysate (PRISMASATE) 1,500 mL/hr at 07/03/16 1206   PRN Meds:.acetaminophen, ALPRAZolam, heparin, hydrocortisone cream, lidocaine (PF), morphine injection, ondansetron **OR** ondansetron (ZOFRAN) IV, sodium chloride, traMADol  ABG    Component Value Date/Time   PHART 7.233 (L) 06/29/2016 1210   PCO2ART 71.3 (HH) 06/29/2016 1210   PO2ART 79.0 (L) 06/29/2016 1210   HCO3 30.1 (H) 06/29/2016 1210   TCO2 32 06/29/2016 1210   O2SAT 92.0 06/29/2016 1210    A/P  1. Dialysis dependent AoCKD and diuretic refractory on CRRT: tol UF, metabolically stable, no complications continue for now.  Discussed big picture.  Concretely addressed barriers and difficulties of iHD in hosp or community.  Family is realistic here 2. AoC dCHF: cont UF 3. AFib on hep systemic 4. Hx/o DVT on chrnic AC 5. Constipation   Pearson Grippe, MD [facility] pgr [phone]

## 2016-07-03 NOTE — Progress Notes (Addendum)
Patient ID: Christina Martin, female   DOB: December 05, 1924, 83 y.o.   MRN: [id number]  PROGRESS NOTE    ADALISE KORANDA  [id number] DOB: 04/19/1925 DOA: 07/07/2016  PCP: [name], MD   Brief Narrative:  89/F with chronic diastolic CHF (last 2 D ECHO in 03/2015 showed EF 65% and grade 1 DD), CKD stage 4 with baseline creatinine 1.80 back in 03/2015, hypertension, GERD. She presented to [facility] with worsening lower extremity swelling and dyspnea x2weeks  She was started on IV lasix , seen by Cards and Renal, poor responsive to diuretics now on CRRT per Renal, Palliative following too. Also in Afib, s/p DCCV converted back to Afib  Assessment & Plan:    Acute on chronic diastolic heart failure  And AKI - ECHO with a EF of 60-65% grade 1 -was on milrinone and high dose IV lasix -160mg  Q8, urine output sluggish, Renal following, s/p HD catheter and HD/UF x1 then started on CRRT - she would be a very poor long term HD candidate, palliative following and needs further discussion before long term HD plans made, I discussed this with son and daughter for 2days abt poor prognosis - On CRRT per Renal, almost 5L pulled, BP holding,  urine output dropping,  110cc yesterday and now 25cc in past 24hours -Palliative following  -Acute renal failure superimposed on stage 4 chronic kidney disease ([place]) - Cr baseline is 1.6 on 02/2016 and on this admission 2.15 which has worsened to 2.68  - Renal ultrasound with mild right renal atrophy. No hydronephrosis or renal obstruction noted. - see discussion above regarding ultrafiltration/HD -not a good long term HD candidate -Palliative following    New onset atrial fibrillation - CHADS vasc score 5, continue amiodarone drip - Initially on xarelto but this was placed on hold due to renal insufficiency so she is on heparin drip - s/p cardioversion, converted to NSR, now back in Afib -on amio gtt    Hx of Left leg DVT 08-2014 - Decreased the dose  to 10 mg a day by hematology and she was supposed to be on it through 08/2016.  - Xarelto on hold due to renal insufficiency  - On heparin drip  - Lower extremity Doppler negative for DVT  Essential hypertension - Lisinopril on hold due to renal insufficiency  - On IV lasix, midodrine   Hyperkalemia - improved  Elevated troponin - Likely demand ischemia due to CKD, acute CHF - Troponin 0.04, 0.05 and 0.04 - No plans for ischemic workup at this time  GERD without esophagitis - Continue protonix   Anemia of chronic kidney disease - stable, monitor   Constipation -increase lactulose, added dulcolax and miralax  DVT prophylaxis: On heparin drip  Code Status: DNR/DNI  Family Communication: Family at the bedside this am Disposition Plan: SNF when stable   Consultants:   Cardiology: Dr Debara Pickett 06/24/2016  Renal  Procedures:   Renal ultrasound 06/25/2016  Lower extremity dopplers 06/24/2016  2-D echo 06/24/2016 - EF 60%  Antimicrobials:   None   Subjective: Restless uncomfortable, small BM smear, breathing a little better  Objective: Vitals:   07/03/16 0900 07/03/16 1000 07/03/16 1100 07/03/16 1112  BP: (!) 148/133 111/61 114/71   Pulse: 92 93 63   Resp: 20 (!) 21 16   Temp:    97.4 F (36.3 C)  TempSrc:    Oral  SpO2: 95% 92% 94%   Weight:      Height:  Intake/Output Summary (Last 24 hours) at 07/03/16 1117 Last data filed at 07/03/16 1100  Gross per 24 hour  Intake            670.8 ml  Output             5592 ml  Net          -4921.2 ml   Filed Weights   07/01/16 1145 07/02/16 0600 07/03/16 0600  Weight: 120.4 kg (265 lb 6.9 oz) 117.5 kg (259 lb 0.7 oz) 112.2 kg (247 lb 5.7 oz)    Examination:  General exam: AAOx1, uncomfortable, restless Respiratory system:  basilar crackles Cardiovascular system: S1 & S2/Irregular slight tachycardia  Gastrointestinal system: A(+) BS, non tender abd  Central nervous system: No focal  neurological deficits. Extremities: +2 LE pitting edema, redness of bilateral extremities with chronic venous skin changes  Skin: Skin is warm and dry   Psychiatry: unable to assess  Data Reviewed: I have personally reviewed following labs and imaging studies  CBC:  Recent Labs Lab 06/29/16 0240 06/30/16 0258 07/01/16 0337 07/02/16 0430 07/03/16 0415  WBC 9.7 11.7* 9.2 8.0 9.2  HGB 11.8* 11.5* 11.1* 12.0 11.8*  HCT 36.9 36.6 35.1* 37.8 37.6  MCV 91.1 91.7 90.0 91.7 92.8  PLT 210 215 197 188 [id number]   Basic Metabolic Panel:  Recent Labs Lab 07/01/16 0337 07/01/16 1648 07/02/16 0430 07/02/16 1622 07/03/16 0415  NA 131* 131* 134*  133* 134* 135  135  K 3.7 3.9 4.0  4.0 4.3 4.3  4.4  CL 91* 91* 95*  94* 97* 98*  98*  CO2 26 29 30  30 28 28  29   GLUCOSE 115* 136* 120*  118* 134* 106*  107*  BUN 39* 36* 27*  27* 21* 18  18  CREATININE 2.04* 2.07* 1.76*  1.75* 1.64* 1.58*  1.56*  CALCIUM 9.0 9.4 9.2  9.2 9.3 9.1  9.1  MG  --   --  2.5*  --  2.6*  PHOS  --  4.0 3.6 3.5 3.1   GFR: Estimated Creatinine Clearance: 28.5 mL/min (by C-G formula based on SCr of 1.58 mg/dL (H)). Liver Function Tests:  Recent Labs Lab 07/01/16 1648 07/02/16 0430 07/02/16 1622 07/03/16 0415  ALBUMIN 2.9* 2.7* 2.8* 2.6*   No results for input(s): LIPASE, AMYLASE in the last 168 hours. No results for input(s): AMMONIA in the last 168 hours. Coagulation Profile:  Recent Labs Lab 06/29/16 0240 06/30/16 0258  INR 1.07 1.08   Cardiac Enzymes: No results for input(s): CKTOTAL, CKMB, CKMBINDEX, TROPONINI in the last 168 hours. BNP (last 3 results) No results for input(s): PROBNP in the last 8760 hours. HbA1C: No results for input(s): HGBA1C in the last 72 hours. CBG: No results for input(s): GLUCAP in the last 168 hours. Lipid Profile: No results for input(s): CHOL, HDL, LDLCALC, TRIG, CHOLHDL, LDLDIRECT in the last 72 hours. Thyroid Function Tests: No results for  input(s): TSH, T4TOTAL, FREET4, T3FREE, THYROIDAB in the last 72 hours. Anemia Panel: No results for input(s): VITAMINB12, FOLATE, FERRITIN, TIBC, IRON, RETICCTPCT in the last 72 hours. Urine analysis:    Component Value Date/Time   COLORURINE YELLOW 06/29/2016 1834   APPEARANCEUR CLOUDY (A) 07/09/2016 1834   LABSPEC 1.013 06/28/2016 1834   PHURINE 5.0 07/08/2016 1834   GLUCOSEU NEGATIVE 07/10/2016 1834   HGBUR SMALL (A) 07/11/2016 1834   BILIRUBINUR NEGATIVE 07/06/2016 1834   KETONESUR NEGATIVE 07/12/2016 1834   PROTEINUR NEGATIVE 06/22/2016 1834  NITRITE NEGATIVE 06/25/2016 1834   LEUKOCYTESUR LARGE (A) 07/09/2016 1834   Sepsis Labs: @LABRCNTIP (procalcitonin:4,lacticidven:4)    Urine culture     Status: Abnormal   Collection Time: 06/25/16  8:16 AM  Result Value Ref Range Status   Specimen Description URINE, SUPRAPUBIC  Final   Special Requests NONE  Final   Culture MULTIPLE SPECIES PRESENT, SUGGEST RECOLLECTION (A)  Final   Report Status 06/26/2016 FINAL  Final  MRSA PCR Screening     Status: None   Collection Time: 06/25/16  5:07 PM  Result Value Ref Range Status   MRSA by PCR NEGATIVE NEGATIVE Final      Radiology Studies: US Renal Result Date: 06/25/2016 Mild right renal atrophy. No hydronephrosis or renal obstruction is noted. Electronically Signed   By: Marijo Conception, M.D.   On: 06/25/2016 08:57     Scheduled Meds: . docusate sodium  100 mg Oral Daily  . feeding supplement   237 mL Oral BID BM  . furosemide  100 mg Intravenous Q8H  . midodrine  5 mg Oral TID WC  . multivitamin with min  1 tablet Oral Daily  . pantoprazole  40 mg Oral Daily   Continuous Infusions: . amiodarone 30 mg/hr (07/03/16 1100)  . heparin 1,000 Units/hr (07/03/16 1100)  . phenylephrine (NEO-SYNEPHRINE) Adult infusion Stopped (07/03/16 0804)  . dialysis replacement fluid (prismasate) 300 mL/hr at 07/02/16 2227  . dialysis replacement fluid (prismasate) 300 mL/hr at 07/02/16 2233    . dialysate (PRISMASATE) 1,500 mL/hr at 07/03/16 0838     LOS: 10 days    Time spent: 35 minutes  Greater than 50% of the time spent on counseling and coordinating the care.   Domenic Polite, MD [facility] Pager [phone]  If 7PM-7AM, please contact night-coverage [email or web address] Password [facility] 07/03/2016, 11:17 AM

## 2016-07-03 NOTE — Progress Notes (Signed)
Advanced Heart Failure Rounding Note  Primary Cardiologist: Dr. Angelena Form   Subjective:    S/P TEE/DCCV 2/7  Trialysis catheter placed 2/9.   BP remains soft but stable on midodrine 5 mg TID.   Continues to require morphine for pain. Had difficulty/delayed swallowing, even with pudding. Denies CP or SOB this am. Very fatigued. Hard to lift her arms.    Remains on CVVHD. Out 4.8 L yesterday. Only 25 cc of urine output. Weight down 18 pounds in 2 days  Objective:   Weight Range: 247 lb 5.7 oz (112.2 kg) Body mass index is 45.24 kg/m.   Vital Signs:   Temp:  [96 F (35.6 C)-97.6 F (36.4 C)] 97.6 F (36.4 C) (02/12 0400) Pulse Rate:  [50-121] 97 (02/12 0700) Resp:  [11-28] 16 (02/12 0700) BP: (87-140)/(42-130) 100/47 (02/12 0700) SpO2:  [92 %-100 %] 98 % (02/12 0700) Weight:  [247 lb 5.7 oz (112.2 kg)] 247 lb 5.7 oz (112.2 kg) (02/12 0600) Last BM Date: 06/25/16  Weight change: Filed Weights   07/01/16 1145 07/02/16 0600 07/03/16 0600  Weight: 265 lb 6.9 oz (120.4 kg) 259 lb 0.7 oz (117.5 kg) 247 lb 5.7 oz (112.2 kg)    Intake/Output:   Intake/Output Summary (Last 24 hours) at 07/03/16 0710 Last data filed at 07/03/16 0700  Gross per 24 hour  Intake            695.8 ml  Output             5536 ml  Net          -4840.2 ml     Physical Exam:  General: Elderly and chronically ill appearing.  On [place].  Awake.   HEENT: Normal. Neck: supple. JVP elevated to jaw. R trialysis cath Carotids 2+ bilat; No  bruits. No thyromegaly or nodule noted.  Cor: PMI nondisplaced. Irregularly irregular.  Lungs: Mild expiratory wheeze.  Abdomen: obese soft, NT, ND, no HSM. No bruits or masses. +BS  Extremities: no cyanosis, clubbing, rash, 2+ pretibial edema.  R arm mildly edematous. Very weak Neuro: Drowsy/ oriented x3, cranial nerves grossly intact. moves all 4 extremities w/o difficulty. Affect flat  Telemetry: Reviewed, AF 90-100s.  Labs: CBC  Recent Labs  07/02/16 0430  07/03/16 0415  WBC 8.0 9.2  HGB 12.0 11.8*  HCT 37.8 37.6  MCV 91.7 92.8  PLT 188 [id number]   Basic Metabolic Panel  Recent Labs  07/02/16 0430 07/02/16 1622 07/03/16 0415  NA 134*  133* 134* 135  135  K 4.0  4.0 4.3 4.3  4.4  CL 95*  94* 97* 98*  98*  CO2 30  30 28 28  29   GLUCOSE 120*  118* 134* 106*  107*  BUN 27*  27* 21* 18  18  CREATININE 1.76*  1.75* 1.64* 1.58*  1.56*  CALCIUM 9.2  9.2 9.3 9.1  9.1  MG 2.5*  --  2.6*  PHOS 3.6 3.5 3.1       Medications:     Scheduled Medications: . Chlorhexidine Gluconate Cloth  6 each Topical Daily  . feeding supplement (NEPRO CARB STEADY)  237 mL Oral TID BM  . lactulose  20 g Oral BID  . midodrine  5 mg Oral TID WC  . pantoprazole  40 mg Oral Daily  . polyethylene glycol  17 g Oral BID  . senna-docusate  1 tablet Oral BID  . sodium chloride flush  10 mL Intracatheter Q12H  . sodium  chloride flush  3 mL Intravenous Q12H  . sodium chloride irrigation  70 mL Irrigation See admin instructions    Infusions: . amiodarone 30 mg/hr (07/02/16 1918)  . heparin 1,000 Units/hr (07/02/16 1918)  . phenylephrine (NEO-SYNEPHRINE) Adult infusion    . dialysis replacement fluid (prismasate) 300 mL/hr at 07/02/16 2227  . dialysis replacement fluid (prismasate) 300 mL/hr at 07/02/16 2233  . dialysate (PRISMASATE) 1,500 mL/hr at 07/03/16 0520    PRN Medications: acetaminophen, ALPRAZolam, heparin, hydrocortisone cream, lidocaine (PF), morphine injection, ondansetron **OR** ondansetron (ZOFRAN) IV, sodium chloride, traMADol   Assessment/Plan   1. Acute on chronic diastolic CHF- ECHO EF [id number].  2. Hypertensive heart disease 3. New onset atrial fibrillation     --CHADSVASC --s/p TEE/DCCV on 2/8 --reverted to AF 2/10 4. Acute on chronic CKD stage IV --CVVHD initiated 2/9 5. History of DVT on chronic anticoagulation 6. Morbid obesity 7. Hypercarbic/hypoxic respiratory failure  Respiratory status improved with  CVVHD. Remains volume overloaded.  Very unlikely that she would tolerate IHD.   Continue midodrine 5 mg TID. Can titrate as needed.    Remains in AF though rate improved on amio.  Continue heparin. Could consider repeat DC-CV.  Having difficulty swallowing.  Suspect high aspiration risk.  Hold po med this am and get stat speech consult.  Pt would not swallow am dose of midodrine.    GOC discussed with family. Plan to remove fluid via CVVHD for 1-2 more days then stop to assess kidney recovery.  Low threshold to progress to hospice.  Length of Stay: [address]  Annamaria Helling  07/03/2016, 7:10 AM  Advanced Heart Failure Team Pager [phone] (M-F; 7a - 4p)  Please contact [facility] for night-coverage after hours (4p -7a ) and weekends on [email or web address]  Patient seen and examined with Oda Kilts, PA-C. We discussed all aspects of the encounter. I agree with the assessment and plan as stated above.   Getting good volume removal with CVVHD. Remains overloaded though. Respiratory status much improved. Urine output minimal. Remains in AF but rate controlled with IV amio. Despite volume removal she is getting weaker by the day and overall prognosis is increasingly concerning.   Will do CVVHD 1-2 more days then stop and see if renal function returns. Have PT see today. Low threshold to switch to comfort care.   Critical care time 35 mins.  Bensimhon, Daniel,MD 5:05 PM

## 2016-07-03 NOTE — Progress Notes (Signed)
  CTSP due to hypotension and apneic episode.  Has had good volume removal with CVVHD over past few days but mental status waxes and wanes. Progressively weak. Frequent discomfort requiring regular morphine boluses.   Tonight, patient developed worsening dyspnea and then became apneic. Was apneic for almost a minute then became bradycardic and hypotensive. Became cyanotic.  Rebounded slowly. Now lethargic but arousable. Audible gurgling. Very weak. CVVHD off.   Discussed with family members. Given age and comorbidities, patient unlikely to make suitable recovery. Will switch to comfort care.   Start morphine and versed gtts. Titrate to comfort.   CCT 35 mins  Dorie Ohms,MD 9:40 PM

## 2016-07-18 ENCOUNTER — Ambulatory Visit: Payer: Medicare Other | Admitting: Internal Medicine

## 2016-07-18 LAB — ECHO TEE
CHL CUP TV REG PEAK VELOCITY: 302 cm/s
LA diam end sys: 45 mm
LADIAMINDEX: 1.96 cm/m2
LASIZE: 45 mm
TRMAXVEL: 302 cm/s

## 2016-07-20 NOTE — Progress Notes (Signed)
Pt passed away at 0255am, family at bedside. Idelia Salm RN and Cleotis Nipper RN auscultated for 3 mins no heart or lungs sounds heard. 74ml of versed and 115ml of morphine wasted by Donnetta Simpers and Lanelle Bal. [facility] packet given to patients family. [facility] notified about time of death. [facility] notified at 0315am.

## 2016-07-20 NOTE — Progress Notes (Signed)
RN paged because pt expired. Apparently, Dr. Haroldine Laws of cardiology saw pt 07/03/16 pm and discussed comfort care with the family to which they agreed. Pt expired at 0255. Pronounced by 2 RNs. Death certificate completed by NP.  KJKG, NP [facility]

## 2016-07-20 NOTE — Discharge Summary (Signed)
Death Summary  Christina Martin [id number] DOB: 1924/07/10 DOA: Jul 08, 2016  PCP: [name], MD  Admit date: 07/08/16 Date of Death: 2016/07/19  Final Diagnoses:    Acute Hypoxic resp failure   Acute on chronic diastolic CHF   Acute Renal Failure on CKD 4   Left leg DVT  08-2014   Atrial fibrillation (HCC)   Hyperkalemia   Elevated troponin   GERD without esophagitis   Acute renal failure with acute tubular necrosis superimposed on stage 4 chronic kidney disease (HCC)   Chronic diastolic congestive heart failure ([place])   Essential hypertension   DNR (do not resuscitate)   Palliative care by specialist   Acute respiratory failure with hypoxia and hypercapnia ([place])   ARF (acute renal failure) ([place])    History of present illness:  89/F with chronic diastolic CHF (last 2 D ECHO in 03/2015 showed EF 65% and grade 1 DD), CKD stage 4 with baseline creatinine 1.80 back in 03/2015, hypertension, GERD. She presented to [place] with worsening lower extremity swelling and dyspnea x2weeks  Hospital Course:  Acute resp failure due to CHF/AKI/VOlume overload -Acute on chronic diastolic heart failure  And AKI - ECHO with a EF of 60-65% grade 1 -was on milrinone and high dose IV lasix -160mg  Q8, urine output sluggish, Renal following, s/p HD catheter and HD/UF x1 then started on CRRT by Dr.Powel. - she would be a very poor long term HD candidate, palliative had been following her,  I discussed this with son and daughter for 2days abt poor prognosis, she was DNR and not felt to be a long term HD candidate. - Volume status started improving on  CRRT then declined overnight with Apnea and after discussion with Dr.Bensimhon with family overnight, she was made comfort care and started on Morphine gtt overnight subsequently expired 2022/07/19 am  -Acute renal failure superimposed on stage 4 chronic kidney disease ([place]) -see above    New onset atrial fibrillation - CHADS vasc score 5, continue  amiodarone drip - Initially on xarelto but this was placed on hold due to renal insufficiency so she is on heparin drip - s/p cardioversion, converted to NSR, now back in Afib -on amio gtt, now comfort care and then expired    Hx of Left leg DVT 08-2014 - Decreased the dose to 10 mg a day by hematology and she was supposed to be on it through 08/2016.  - Xarelto held due to renal insufficiency  - has been on heparin drip   Hyperkalemia - improved  Elevated troponin - Likely demand ischemia due to CKD, acute CHF - Troponin 0.04, 0.05 and 0.04 - No plans for ischemic workup at this time  GERD without esophagitis - Continue protonix   Anemia of chronic kidney disease - stable, monitor   Constipation -increased lactulose, added dulcolax and miralax   Time: 79min  Signed:  Wanona Stare  [facility] Jul 19, 2016, 2:30 PM

## 2016-07-20 DEATH — deceased

## 2016-09-11 ENCOUNTER — Ambulatory Visit: Payer: Medicare Other | Admitting: Hematology & Oncology

## 2016-09-11 ENCOUNTER — Other Ambulatory Visit: Payer: Medicare Other

## 2016-10-20 NOTE — Addendum Note (Signed)
Addendum  created 10/20/16 1042 by Drue Camera, MD   Sign clinical note    

## 2017-06-13 IMAGING — US US RENAL
1 series · 14 of 25 positions shown · non-contrast
Comparison: None.

CLINICAL DATA: Acute renal failure.

EXAM:
RENAL / URINARY TRACT ULTRASOUND COMPLETE

[Series 1: us renal · 0.21mm/px · 14 of 27 slices shown]
[im 1/27]
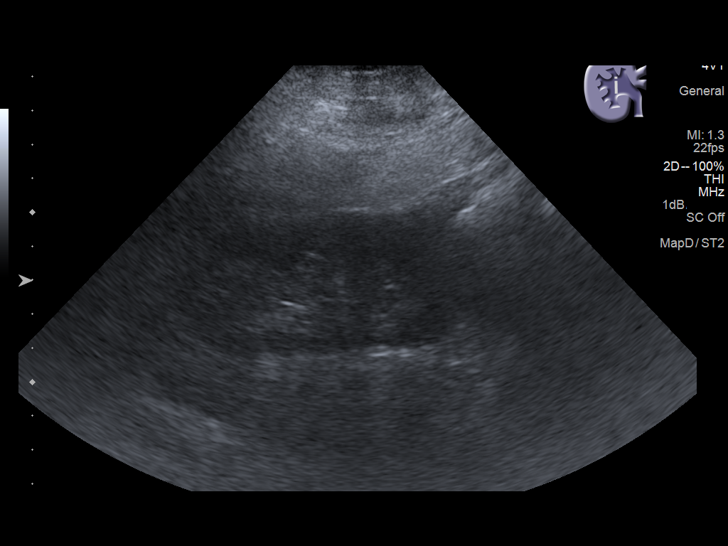
[im 3/27]
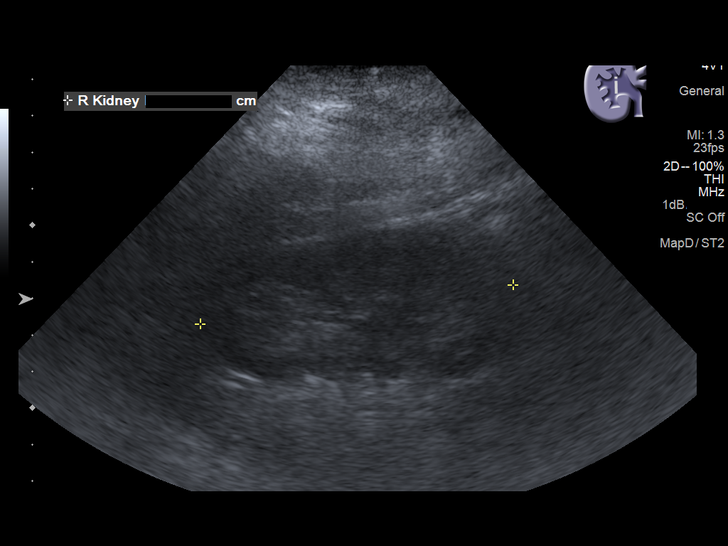
[im 5/27]
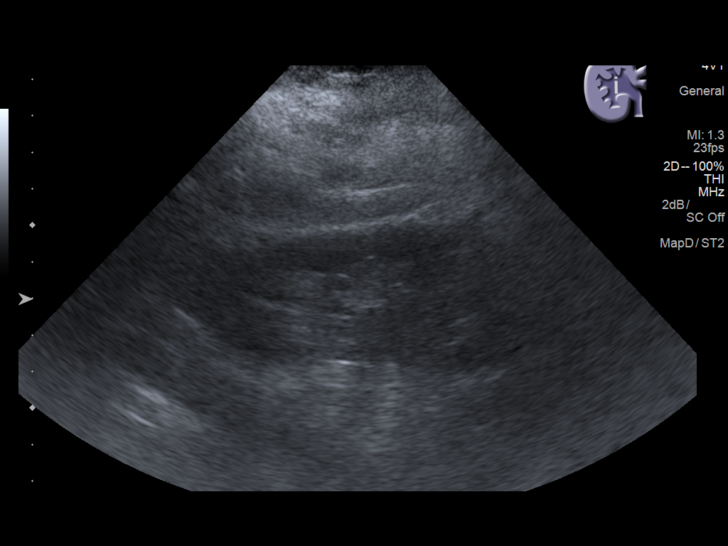
[im 7/27]
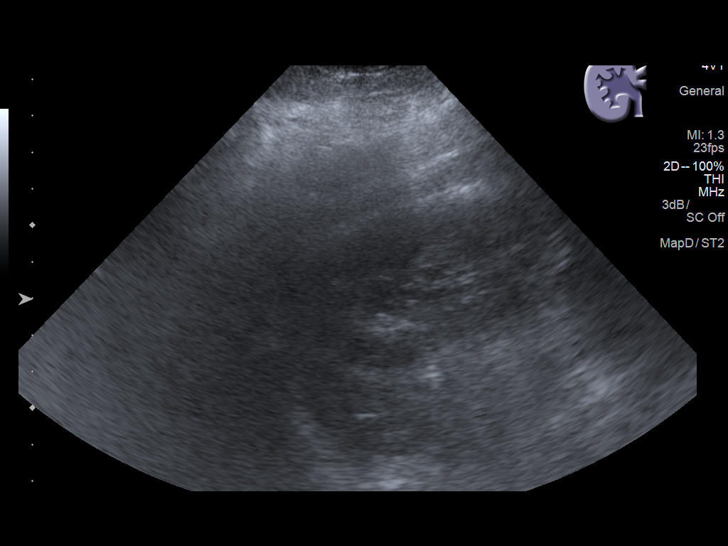
[im 9/27]
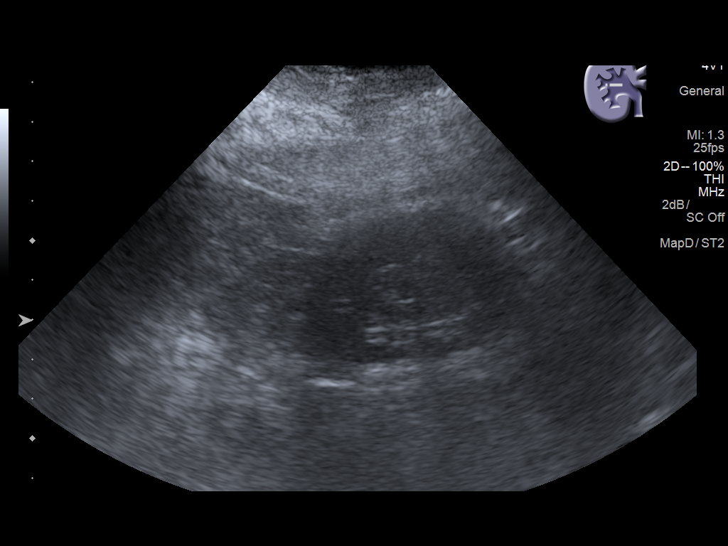
[im 10/27]
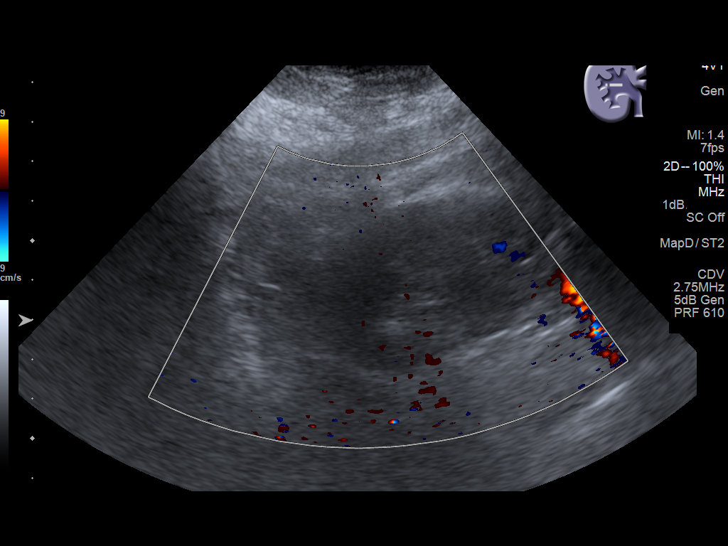
[im 12/27]
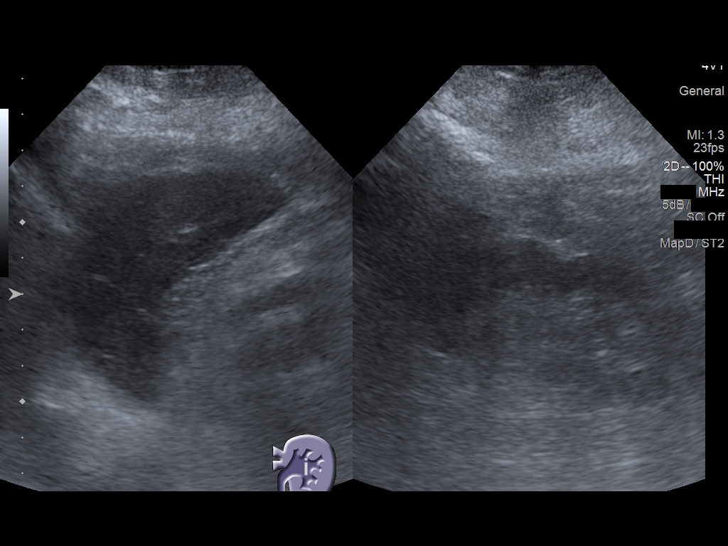
[im 15/27]
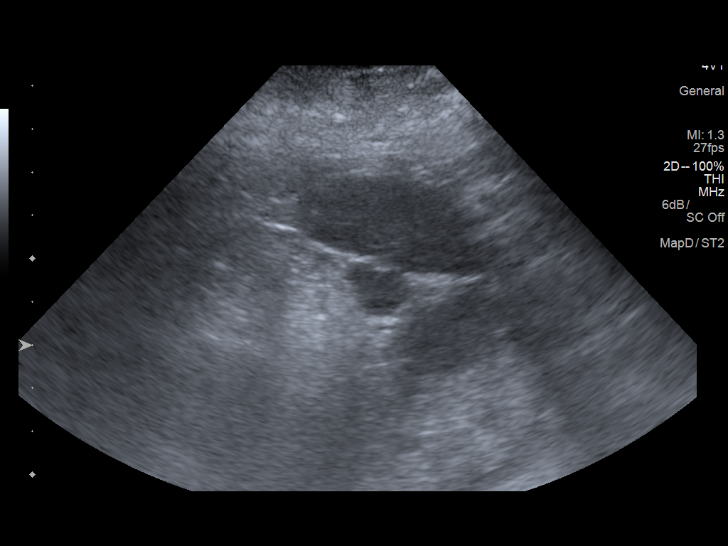
[im 17/27]
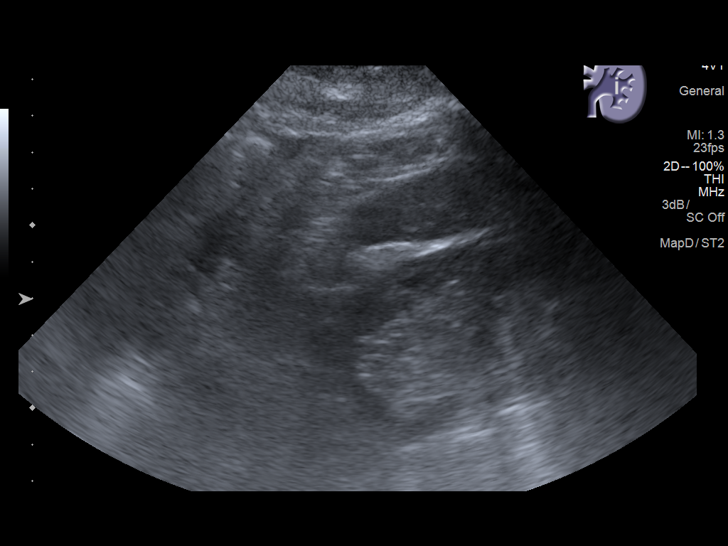
[im 18/27]
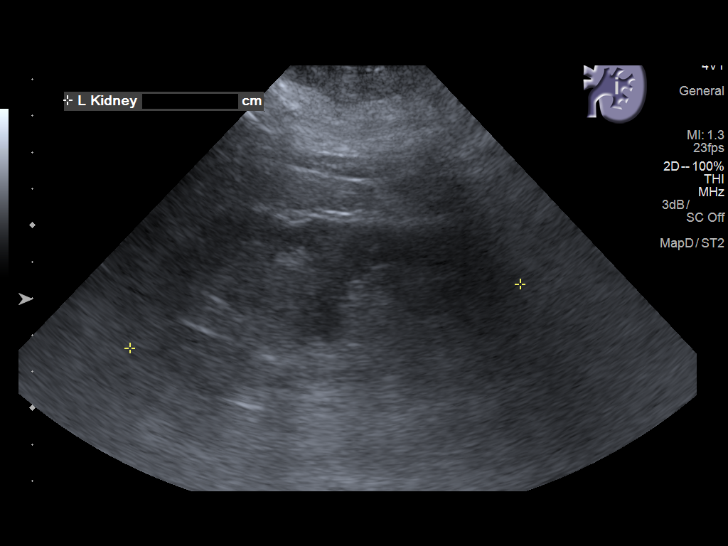
[im 20/27]
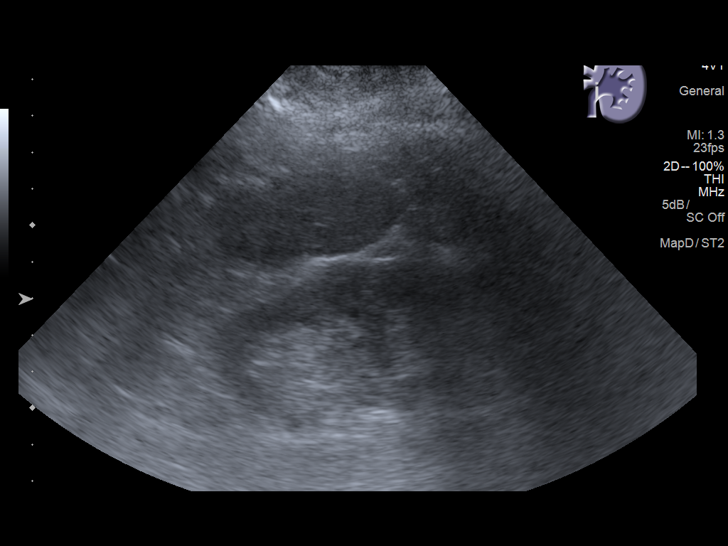
[im 22/27]
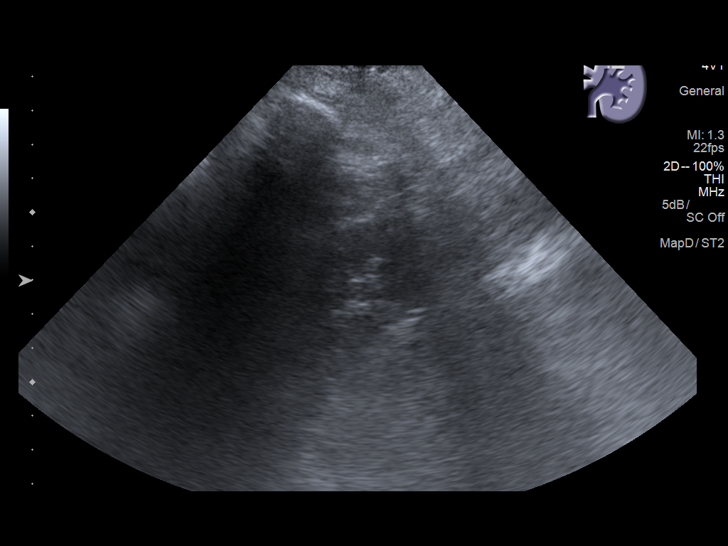
[im 24/27]
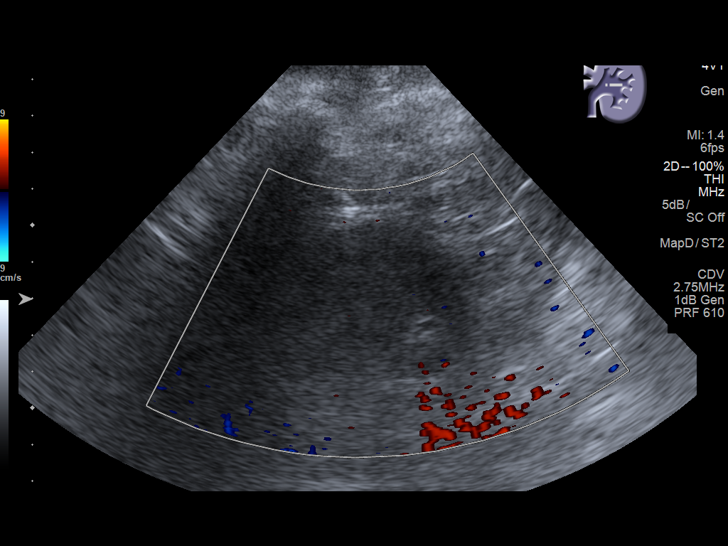
[im 27/27]
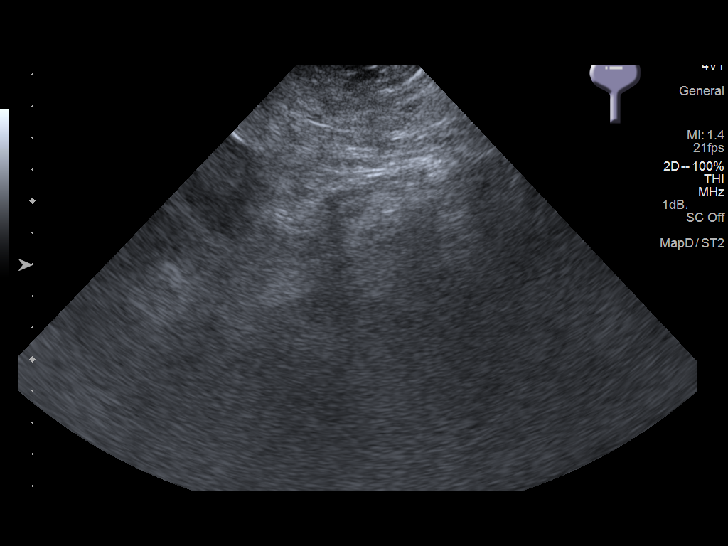

[14 of 25 positions shown; findings below may reference images not displayed]

FINDINGS: Right Kidney:

Length: 8.6 cm. Echogenicity within normal limits. No mass or
hydronephrosis visualized.

Left Kidney:

Length: 10.8 cm. Echogenicity within normal limits. No mass or
hydronephrosis visualized.

Bladder:

Decompressed and therefore not well visualized.
IMPRESSION: Mild right renal atrophy. No hydronephrosis or renal obstruction is
noted.

## 2018-03-24 IMAGING — DX DG CHEST 2V
2 series · 2 of 2 positions shown · non-contrast
Comparison: CXR 03/26/2015

CLINICAL DATA: Congestive heart failure, dyspnea

EXAM:
CHEST  2 VIEW

[chest lat]
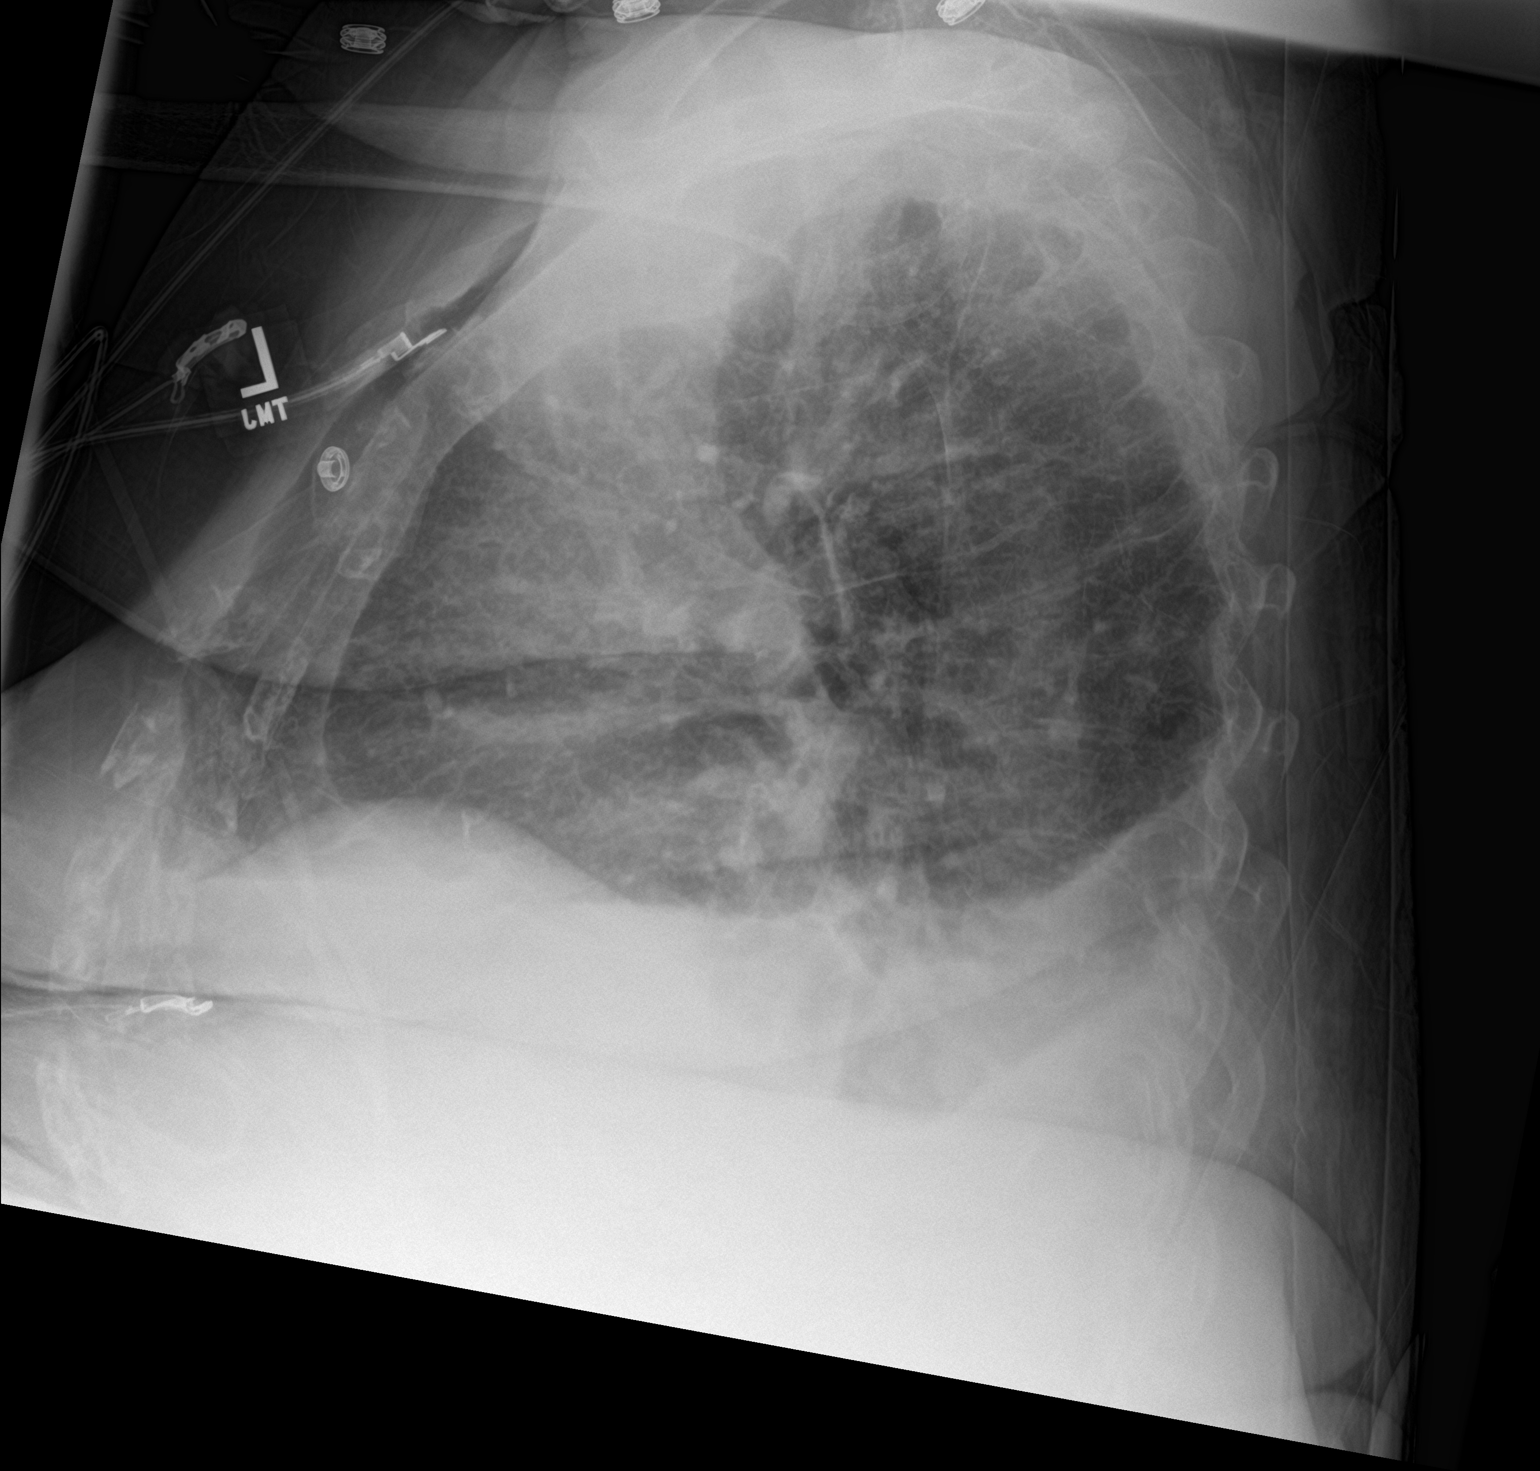

[chest ap]
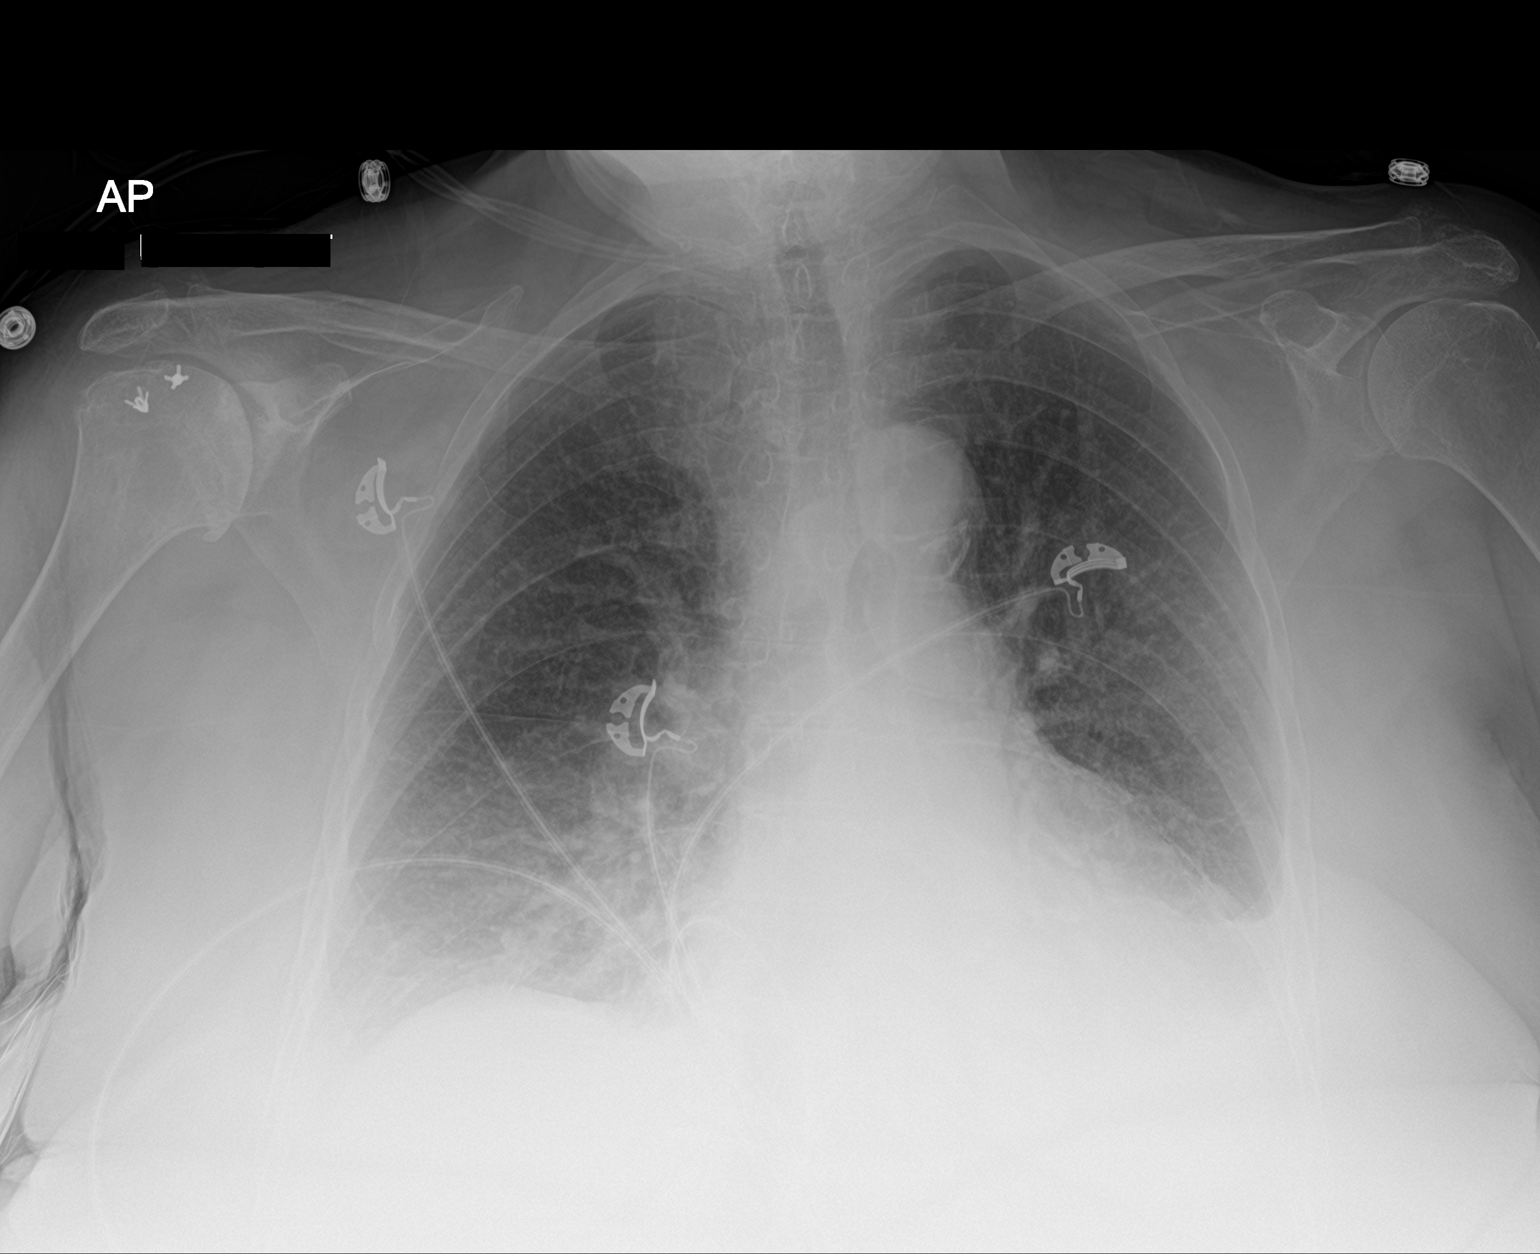

[2 of 2 positions shown; findings below may reference images not displayed]

FINDINGS: The heart size is within normal limits for size. Aortic
atherosclerosis without aneurysm is again seen. There is mild
interstitial edema with posterior small pleural effusions, left
greater than right. Surgical anchors project over the right humeral
head with osteoarthritic spurring and sclerosis of the right
glenohumeral joint. Degenerative changes are seen along the dorsal
spine.
IMPRESSION: Interstitial edema with small posterior pleural effusions left
greater than right. Aortic atherosclerosis.

## 2018-03-26 IMAGING — DX DG CHEST 1V PORT
1 series · 1 of 1 positions shown · non-contrast
Comparison: 06/27/2016

CLINICAL DATA: Shortness of breath.

EXAM:
PORTABLE CHEST 1 VIEW

[chest ap]
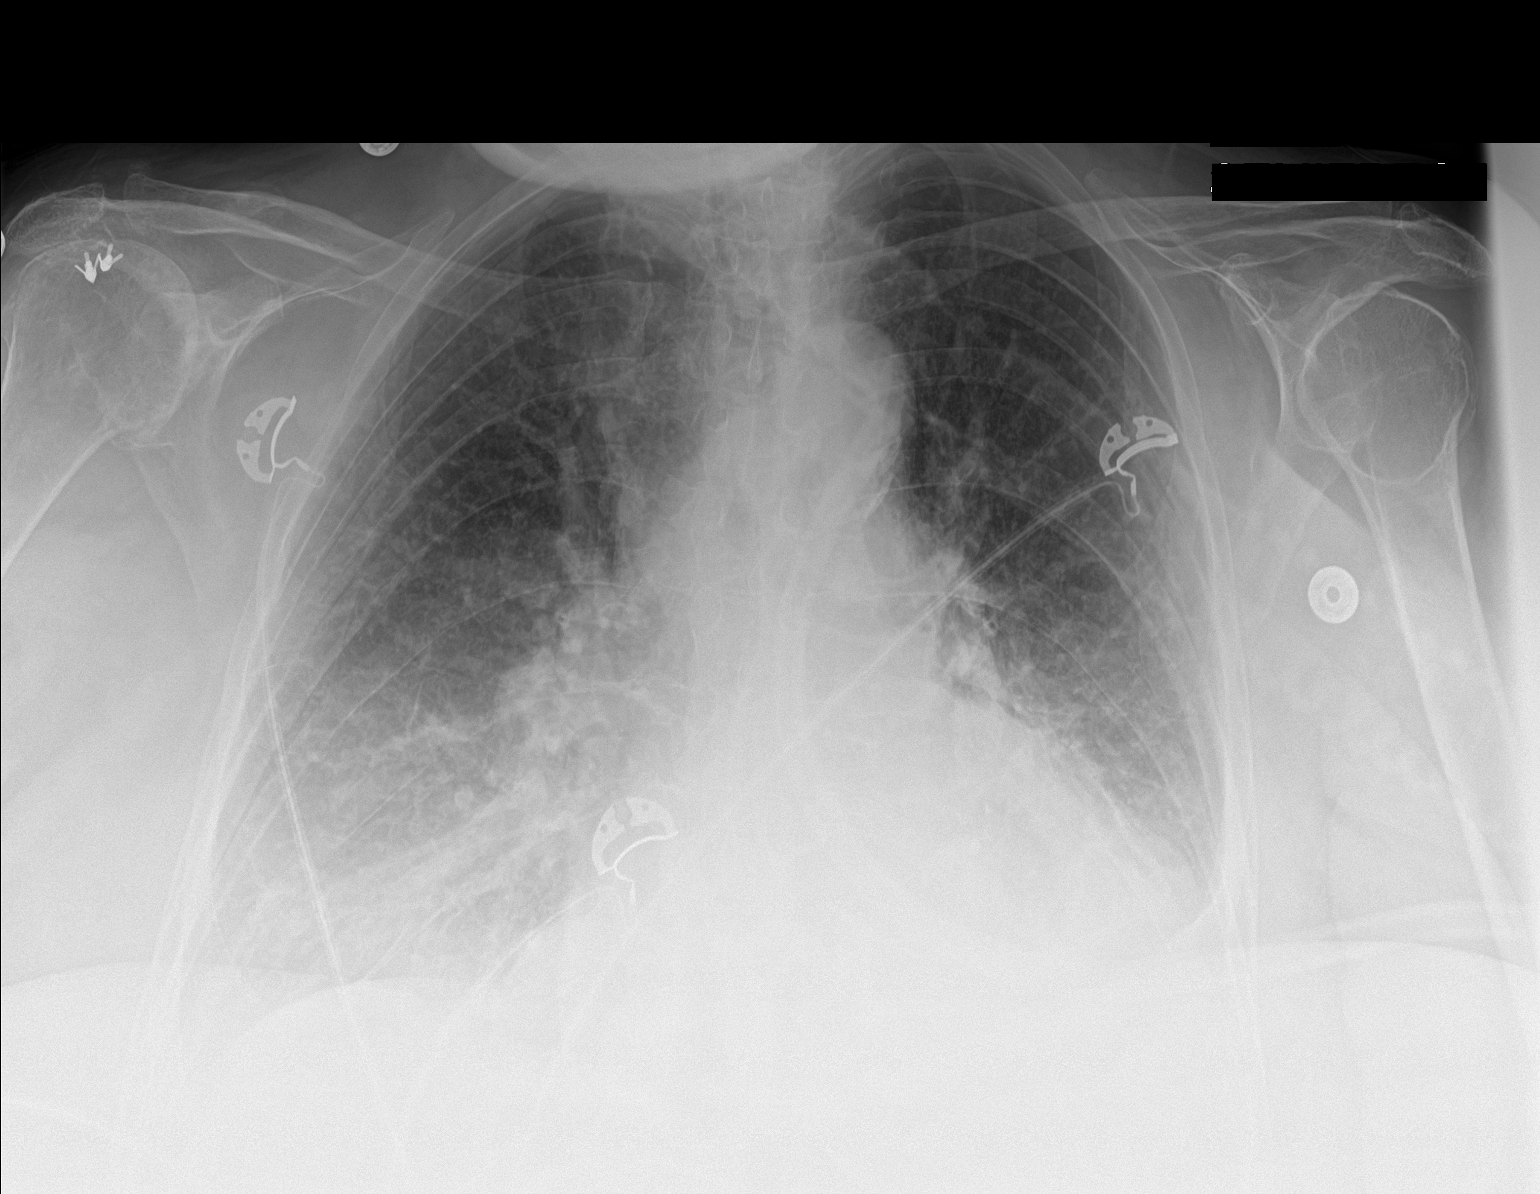

[1 of 1 positions shown; findings below may reference images not displayed]

FINDINGS: There is hyperinflation of the lungs compatible with COPD. F
cardiomegaly with vascular congestion and bilateral interstitial and
alveolar opacities, likely mild edema. Suspect small effusions. No
acute bony abnormality.
IMPRESSION: Mild CHF.  Suspect small effusions.

## 2018-03-27 IMAGING — XA IR US GUIDE VASC ACCESS RIGHT
1 series · 1 of 1 positions shown · non-contrast
Comparison: None.

INDICATION: End-stage renal disease. In need of intravenous access for the
initiation of dialysis.

EXAM:
NON-TUNNELED CENTRAL VENOUS HEMODIALYSIS CATHETER PLACEMENT WITH
ULTRASOUND AND FLUOROSCOPIC GUIDANCE

[Series 1: single · 1 of 1 slices shown]
[im 1/1]
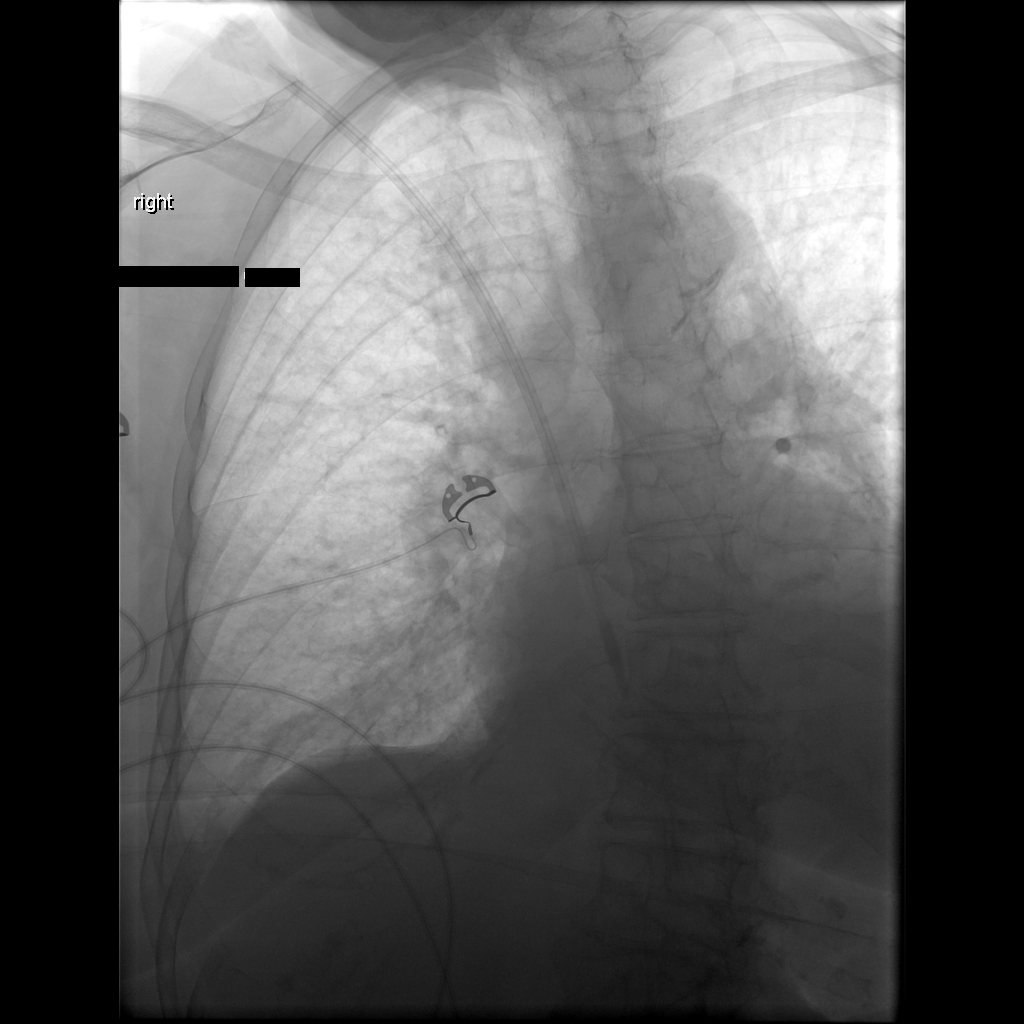

[1 of 1 positions shown; findings below may reference images not displayed]

MEDICATIONS:
None

FLUOROSCOPY TIME:  1 minutes, 24 seconds (18 mGy)

COMPLICATIONS:
None immediate.



After creating a small venotomy incision, a micropuncture kit was
utilized to access the right internal jugular vein under direct,
real-time ultrasound guidance after the overlying soft tissues were
anesthetized with 1% lidocaine with epinephrine. Ultrasound image
documentation was performed. The microwire was kinked to measure
appropriate catheter length. A stiff glidewire was advanced to the
level of the IVC. Under fluoroscopic guidance, the venotomy was
serially dilated, ultimately allowing placement of a 20 cm temporary
Trialysis catheter with tip ultimately terminating within the
superior aspect of the right atrium. Final catheter positioning was
confirmed and documented with a spot radiographic image. The
catheter aspirates and flushes normally. The catheter was flushed
with appropriate volume heparin dwells.

The catheter exit site was secured with a 0-Prolene retention
suture. A dressing was placed. The patient tolerated the procedure
well without immediate post procedural complication.
IMPRESSION: Successful placement of a right internal jugular approach 20 cm
temporary dialysis catheter with tip terminating with in the
superior aspect of the right atrium. The catheter is ready for
immediate use.

PLAN:
This catheter may be converted to a tunneled dialysis catheter at a
later date as indicated.

## 2018-10-17 ENCOUNTER — Other Ambulatory Visit: Payer: Self-pay
